# Patient Record
Sex: Female | Born: 1935 | Race: White | Hispanic: No | Marital: Married
Health system: Midwestern US, Community
[De-identification: ages and names within clinical notes are randomized; demographics above are authoritative.]

## PROBLEM LIST (undated history)

## (undated) DIAGNOSIS — I83009 Varicose veins of unspecified lower extremity with ulcer of unspecified site: Secondary | ICD-10-CM

## (undated) DIAGNOSIS — J13 Pneumonia due to Streptococcus pneumoniae: Secondary | ICD-10-CM

## (undated) DIAGNOSIS — I2510 Atherosclerotic heart disease of native coronary artery without angina pectoris: Secondary | ICD-10-CM

## (undated) DIAGNOSIS — I872 Venous insufficiency (chronic) (peripheral): Secondary | ICD-10-CM

## (undated) DIAGNOSIS — M51379 Other intervertebral disc degeneration, lumbosacral region without mention of lumbar back pain or lower extremity pain: Secondary | ICD-10-CM

## (undated) DIAGNOSIS — E785 Hyperlipidemia, unspecified: Secondary | ICD-10-CM

## (undated) DIAGNOSIS — C50212 Malignant neoplasm of upper-inner quadrant of left female breast: Secondary | ICD-10-CM

## (undated) DIAGNOSIS — M810 Age-related osteoporosis without current pathological fracture: Secondary | ICD-10-CM

## (undated) DIAGNOSIS — M5137 Other intervertebral disc degeneration, lumbosacral region: Secondary | ICD-10-CM

## (undated) DIAGNOSIS — R9089 Other abnormal findings on diagnostic imaging of central nervous system: Secondary | ICD-10-CM

## (undated) DIAGNOSIS — E039 Hypothyroidism, unspecified: Secondary | ICD-10-CM

## (undated) DIAGNOSIS — M4120 Other idiopathic scoliosis, site unspecified: Secondary | ICD-10-CM

## (undated) DIAGNOSIS — Z85850 Personal history of malignant neoplasm of thyroid: Secondary | ICD-10-CM

## (undated) DIAGNOSIS — L97909 Non-pressure chronic ulcer of unspecified part of unspecified lower leg with unspecified severity: Secondary | ICD-10-CM

## (undated) HISTORY — DX: Other abnormal findings on diagnostic imaging of central nervous system: R90.89

## (undated) HISTORY — DX: Other intervertebral disc degeneration, lumbosacral region without mention of lumbar back pain or lower extremity pain: M51.379

## (undated) HISTORY — DX: Other intervertebral disc degeneration, lumbosacral region: M51.37

## (undated) HISTORY — DX: Age-related osteoporosis without current pathological fracture: M81.0

## (undated) HISTORY — DX: Non-pressure chronic ulcer of unspecified part of unspecified lower leg with unspecified severity: I83.009

## (undated) HISTORY — DX: Personal history of malignant neoplasm of thyroid: Z85.850

## (undated) HISTORY — PX: THYROIDECTOMY: SHX17

## (undated) HISTORY — DX: Malignant neoplasm of upper-inner quadrant of left female breast: C50.212

## (undated) HISTORY — DX: Pneumonia due to Streptococcus pneumoniae: J13

## (undated) HISTORY — DX: Varicose veins of unspecified lower extremity with ulcer of unspecified site: L97.909

## (undated) HISTORY — PX: OTHER SURGICAL HISTORY: SHX169

## (undated) HISTORY — DX: Atherosclerotic heart disease of native coronary artery without angina pectoris: I25.10

## (undated) HISTORY — DX: Hypothyroidism, unspecified: E03.9

## (undated) HISTORY — DX: Other idiopathic scoliosis, site unspecified: M41.20

## (undated) HISTORY — DX: Venous insufficiency (chronic) (peripheral): I87.2

---

## 2009-02-23 NOTE — Procedures (Signed)
Procedures filed by Adah Salvage, MD at 02/26/09 [phone]                Author: Adah Salvage, MD  Service: --  Author Type: Physician       Filed: 02/26/09 0906  Date of Service: 02/23/09 0804  Status: Addendum          Editor: Adah Salvage, MD (Physician)            Procedure Orders        1. US DUPLEX LOWER EXT VEIN Sherre Poot [[id number]] ordered by  at 02/23/09 0804                         <!--EPICS-->                   [facility]<BR>                         [id number]<BR>                             [place], [place] [address]<BR> <BR> <BR> Name:      Gina Serrano          Service Date:    02/22/2009<BR> DOB:       February 02, 1932                Ordered by:<BR> MR #       [id number]                 Location:<BR> Sex:       F                          Age:             75<BR> Billing #: [id number]              Date of Adm:     02/22/2009<BR> <BR> <BR>                        LOWER EXTREMITY VENOUS DUPLEX<BR> <BR> <BR> REASON FOR PROCEDURE:  Leg pain.<BR> <BR> FINDINGS:  Pulsed Doppler spectral  analysis shows fully competent deep and<BR> superficial systems without significant reflux. B-mode and color imaging<BR> shows fully compressible vessels throughout without thrombosis or<BR> obstruction.<BR> <BR> SUMMARY:  Negative lower extremity venous  duplex for thrombosis or<BR> significant reflux.<BR> <BR> E-Signed By<BR> Alvina Filbert, MD 02/26/2009 09:05<BR> Alvina Filbert, MD<BR> <BR> cc:   Sunnie Nielsen. Angeli, DO<BR>       Alvina Filbert, MD<BR> <BR> <BR> <BR> MW/WMX; D: 02/23/2009  7:43 A; T: 02/23/2009   8:04 A; DOC# [id number]; JOB#<BR> [id number]<OZ> <!--EPICE-->

## 2009-02-23 NOTE — Procedures (Addendum)
[facility]   [address]   [place], [place] [address]      Name: Gina Serrano, Gina Serrano Service Date: 02/22/2009  DOB: 1931/09/09 Ordered by:  MR # [id number] Location:  Sex: F Age: 75  Billing #: [id number] Date of Adm: 02/22/2009       LOWER EXTREMITY VENOUS DUPLEX      REASON FOR PROCEDURE: Leg pain.    FINDINGS: Pulsed Doppler spectral analysis shows fully competent deep and  superficial systems without significant reflux. B-mode and color imaging  shows fully compressible vessels throughout without thrombosis or  obstruction.    SUMMARY: Negative lower extremity venous duplex for thrombosis or  significant reflux.    E-Signed By  Alvina Filbert, MD 02/26/2009 09:05  Alvina Filbert, MD    cc: Sunnie Nielsen. Mort Sawyers, DO   Alvina Filbert, MD        MW/WMX; D: 02/23/2009 7:43 A; T: 02/23/2009 8:04 A; DOC# [id number]; JOB#  [id number]

## 2009-09-20 NOTE — Progress Notes (Signed)
Ms. Bresnan came for follow-up.      She is having no symptoms of shortness of breath.      Her ROS is unchanged.     Heart rhythm normal.  Lungs were clear.     Abdomen soft.      Neuro grossly intact.      I reassured her that her heart was fine.  Continue medications.  We will see her back for the stress test in July.     She cannot tolerate statins or beta blockers.     MedDATA/jrc      BP 116/68   Pulse 65   Resp 18   Ht 5\' 7"  (1.702 m)   Wt 132 lb (59.875 kg)   SpO2 97%  \\  Current outpatient prescriptions ordered prior to encounter   Medication Sig Dispense Refill   ??? aspirin delayed-release 81 mg tablet Take 81 mg by mouth daily.       ??? clopidogrel (PLAVIX) 75 mg tablet Take  by mouth daily.

## 2009-09-22 NOTE — Communication Body (Addendum)
Gina Serrano came for follow-up.    Current outpatient prescriptions:SYNTHROID 150 mcg tablet, , Disp: , Rfl: ;  aspirin delayed-release 81 mg tablet, Take 81 mg by mouth daily., Disp: , Rfl: ;  BACTROBAN NASAL 2 % nasal ointment, , Disp: , Rfl: ;  clopidogrel (PLAVIX) 75 mg tablet, Take  by mouth daily., Disp: , Rfl:     BP 116/68   Pulse 65   Resp 18   Ht 5\' 7"  (1.702 m)   Wt 132 lb (59.875 kg)   SpO2 97%    She is having no symptoms of shortness of breath.      Her ROS is unchanged.     Heart rhythm normal.  Lungs were clear.     Abdomen soft.      Neuro grossly intact.      I reassured her that her heart was fine.  Continue medications.  We will see her back for the stress test in July.     She cannot tolerate statins or beta blockers.

## 2010-03-21 NOTE — Telephone Encounter (Signed)
Patient came in for stress echo last week, she states this weekend she went to her physician because she started having an allergic reaction on her skin, at first she felt it may be from the Lipitor but then they formed like the EKG leads.  Her physician gave her a shot of cortizon and some oitment he felt it was more from the leads then the Lipitor but she would like to check with [place] and make sure as well as we need to list this reaction and let her know what to do for the future for testing.

## 2010-03-22 NOTE — Telephone Encounter (Signed)
Lm

## 2010-04-08 NOTE — Telephone Encounter (Addendum)
Spoke with patient who reports and allergic reaction that appeared to be in the shape of the leads, however, she had restarted the Lipitor around the same time, so she was unsure what was causing her symptoms.  She also reports some generalized aching along with the rash on the skin.  She was advised to discontinue the lipitor and then resume for a short time when the skin rash is completely gone and see if symptoms return.  Patient can take benedryl for symptoms immediately if they DO return, and notify us immediately.  Patient verbalized understanding of teaching and is agreeable with the plan of care.

## 2010-08-15 ENCOUNTER — Ambulatory Visit
Admission: RE | Admit: 2010-08-15 | Discharge: 2010-08-15 | Payer: Self-pay | Source: Home / Self Care | Admitting: Emergency Medicine

## 2010-08-15 DIAGNOSIS — E782 Mixed hyperlipidemia: Secondary | ICD-10-CM | POA: Insufficient documentation

## 2010-08-17 ENCOUNTER — Telehealth (INDEPENDENT_AMBULATORY_CARE_PROVIDER_SITE_OTHER): Payer: Self-pay | Admitting: *Deleted

## 2010-09-20 ENCOUNTER — Encounter: Payer: Self-pay | Admitting: Family Medicine

## 2010-09-22 NOTE — Assessment & Plan Note (Signed)
Summary: NASAL CONGESTION/TJ Room 4   Vital Signs:  Patient Profile:   77 Years Old Female CC:      Cough, congestion, exhausted, chills x 8 days Height:     67 inches Weight:      135 pounds O2 Sat:      95 % O2 treatment:    Room Air Temp:     99.6 degrees F oral Pulse rate:   75 / minute Pulse rhythm:   regular Resp:     20 per minute BP sitting:   105 / 56  (left arm) Cuff size:   regular  Vitals Entered By: Emilio Math (August 15, 2010 2:46 PM)                  Current Allergies: ! CIPRO ! DOXYCYCLINE ! AMOXICILLIN ! [facility] ! LEVAQUINHistory of Present Illness History from: patient Chief Complaint: Cough, congestion, exhausted, chills x 8 days History of Present Illness: 77 Years Old Female complains of onset of cold symptoms for 9 days.  Tola has been using no OTC meds.  Her husband has pneumonia and is currently hospitalized.  She has many antibiotic allergies. No sore throat + cough No pleuritic pain No wheezing + nasal congestion + post-nasal drainage + sinus pain/pressure + chest congestion No itchy/red eyes No earache No hemoptysis No SOB No chills/sweats No fever + nausea No vomiting No abdominal pain No diarrhea No skin rashes + fatigue No myalgias No headache   Current Meds SYNTHROID 50 MCG TABS (LEVOTHYROXINE SODIUM)  ASPIRIN 81 MG CHEW (ASPIRIN)  ERYTHROMYCIN BASE 500 MG TABS (ERYTHROMYCIN BASE) 1 by mouth two times a day for 10 days  REVIEW OF SYSTEMS Constitutional Symptoms      Denies fever, chills, night sweats, weight loss, weight gain, and fatigue.  Eyes       Denies change in vision, eye pain, eye discharge, glasses, contact lenses, and eye surgery. Ear/Nose/Throat/Mouth       Complains of frequent runny nose, sinus problems, sore throat, and hoarseness.      Denies hearing loss/aids, change in hearing, ear pain, ear discharge, dizziness, frequent nose bleeds, and tooth pain or bleeding.  Respiratory       Complains  of dry cough.      Denies productive cough, wheezing, shortness of breath, asthma, bronchitis, and emphysema/COPD.  Cardiovascular       Denies murmurs, chest pain, and tires easily with exhertion.    Gastrointestinal       Complains of nausea/vomiting.      Denies stomach pain, diarrhea, constipation, blood in bowel movements, and indigestion. Genitourniary       Denies painful urination, kidney stones, and loss of urinary control. Neurological       Denies paralysis, seizures, and fainting/blackouts. Musculoskeletal       Denies muscle pain, joint pain, joint stiffness, decreased range of motion, redness, swelling, muscle weakness, and gout.  Skin       Denies bruising, unusual mles/lumps or sores, and hair/skin or nail changes.  Psych       Denies mood changes, temper/anger issues, anxiety/stress, speech problems, depression, and sleep problems.  Past History:  Past Medical History: Hyperlipidemia  Past Surgical History: Head 05 Eye 08 Thyroid 05  Social History: Non smoker No ETOH No DRugs Physical Exam General appearance: well developed, well nourished, no acute distress Ears: normal, no lesions or deformities Nasal: mucosa pink, nonedematous, no septal deviation, turbinates normal Oral/Pharynx: tongue normal, posterior pharynx without  erythema or exudate Chest/Lungs: no rales, wheezes, or rhonchi bilateral, breath sounds equal without effort Heart: regular rate and  rhythm, no murmur Skin: no obvious rashes or lesions MSE: oriented to time, place, and person Assessment New Problems: PNEUMONIA, COMMUNITY ACQUIRED, PNEUMOCOCCAL (ICD-481) COUGH (ICD-786.2) UPPER RESPIRATORY INFECTION, ACUTE (ICD-465.9) COUGH (ICD-786.2) HYPERLIPIDEMIA (ICD-272.4)  CXR: Right lower lobe airspace disease.  Patient Education: Patient and/or caregiver instructed in the following: rest, fluids, Tylenol prn.  Plan New Medications/Changes: ERYTHROMYCIN BASE 500 MG TABS (ERYTHROMYCIN  BASE) 1 by mouth two times a day for 10 days  #20 x 0, 08/15/2010, Hoyt Koch MD  New Orders: New Patient Level III [id number] T-Chest x-ray, 2 views [71020] Pulse Oximetry [94760] Planning Comments:   1)  Take the prescribed antibiotic as instructed.  Patient is allergic to many antibiotics but thinks this one is ok.  Will treat with the pneumonia dose of 4x per day for 10 days.  Needs to follow up with PCP in 2-3 days to ensure she is improving.  If getting worse (fevers, worsening symptoms) go to ER.  Use nasal saline solution (over the counter) at least 3 times a day.  Can take tylenol every 6 hours or motrin every 8 hours for pain or fever.   The patient and/or caregiver has been counseled thoroughly with regard to medications prescribed including dosage, schedule, interactions, rationale for use, and possible side effects and they verbalize understanding.  Diagnoses and expected course of recovery discussed and will return if not improved as expected or if the condition worsens. Patient and/or caregiver verbalized understanding.  Prescriptions: ERYTHROMYCIN BASE 500 MG TABS (ERYTHROMYCIN BASE) 1 by mouth two times a day for 10 days  #20 x 0   Entered and Authorized by:   Hoyt Koch MD   Signed by:   Hoyt Koch MD on 08/15/2010   Method used:   Print then Give to Patient   RxID:   [id number]   Orders Added: 1)  New Patient Level III [95621] 2)  T-Chest x-ray, 2 views [71020] 3)  Pulse Oximetry [30865]

## 2010-09-22 NOTE — Progress Notes (Signed)
  Phone Note Outgoing Call Call back at [facility] Phone [phone]   Call placed by: Emilio Math,  August 17, 2010 10:17 AM Call placed to: Patient

## 2010-09-22 NOTE — Progress Notes (Signed)
  Phone Note Outgoing Call Call back at [facility] Phone [phone]   Call placed by: Emilio Math,  August 17, 2010 10:20 AM Call placed to: Patient Summary of Call: Spoke to patient's husband she was asleep, he says she's not feeling bettter, I advised him to take her to her doctor or the ED, since she does have pneumonia and is not getting better.

## 2010-09-28 NOTE — Letter (Signed)
Summary: Internal Other  Internal Other   Imported By: Dannette Barbara 09/20/2010 08:19:19  _____________________________________________________________________  External Attachment:    Type:   Image     Comment:   External Document

## 2010-10-19 ENCOUNTER — Encounter: Payer: Self-pay | Admitting: Emergency Medicine

## 2010-10-19 ENCOUNTER — Ambulatory Visit: Payer: Medicare Other | Admitting: Emergency Medicine

## 2010-10-23 ENCOUNTER — Telehealth (INDEPENDENT_AMBULATORY_CARE_PROVIDER_SITE_OTHER): Payer: Self-pay

## 2010-10-27 NOTE — Assessment & Plan Note (Signed)
Summary: Possible sinus infection/left foot pain   Vital Signs:  Patient Profile:   77 Years Old Female CC:      ?sinus infection x 2 weeks, left foot swelling and itching x 2 days Height:     67 inches Weight:      138 pounds O2 Sat:      97 % O2 treatment:    Room Air Temp:     98.3 degrees F oral Pulse rate:   72 / minute Resp:     16 per minute BP sitting:   112 / 68  (left arm) Cuff size:   regular  Vitals Entered By: Lajean Saver RN (October 19, 2010 3:37 PM)                  Updated Prior Medication List: SYNTHROID 50 MCG TABS (LEVOTHYROXINE SODIUM)  ASPIRIN 81 MG CHEW (ASPIRIN)   Current Allergies (reviewed today): ! CIPRO ! DOXYCYCLINE ! AMOXICILLIN ! [facility] ! LEVAQUINHistory of Present Illness History from: patient Chief Complaint: ?sinus infection x 2 weeks, left foot swelling and itching x 2 days History of Present Illness: 77 Years Old Female complains of onset of cold symptoms for 2 1/2 weeks.  Mileah has been using Mucinex which is helping a little bit.  She had pneumonia a few months ago.  She also complains of L foot itching, mild swelling, and mild soreness for 2 days.  She thinks it is getting better today.  No leg pain, CP, SOB. No sore throat + cough No pleuritic pain No wheezing + nasal congestion No post-nasal drainage + sinus pain/pressure No chest congestion No itchy/red eyes No earache No hemoptysis No SOB No chills/sweats No fever No nausea No vomiting No abdominal pain No diarrhea No skin rashes No fatigue No myalgias No headache   REVIEW OF SYSTEMS Constitutional Symptoms      Denies fever, chills, night sweats, weight loss, weight gain, and fatigue.  Eyes       Denies change in vision, eye pain, eye discharge, glasses, contact lenses, and eye surgery. Ear/Nose/Throat/Mouth       Complains of frequent runny nose and sinus problems.      Denies hearing loss/aids, change in hearing, ear pain, ear discharge, dizziness,  frequent nose bleeds, sore throat, hoarseness, and tooth pain or bleeding.  Respiratory       Denies dry cough, productive cough, wheezing, shortness of breath, asthma, bronchitis, and emphysema/COPD.  Cardiovascular       Denies murmurs, chest pain, and tires easily with exhertion.    Gastrointestinal       Denies stomach pain, nausea/vomiting, diarrhea, constipation, blood in bowel movements, and indigestion. Genitourniary       Denies painful urination, kidney stones, and loss of urinary control. Neurological       Denies paralysis, seizures, and fainting/blackouts. Musculoskeletal       Complains of redness and swelling.      Denies muscle pain, joint pain, joint stiffness, decreased range of motion, muscle weakness, and gout.  Skin       Denies bruising, unusual mles/lumps or sores, and hair/skin or nail changes.  Psych       Denies mood changes, temper/anger issues, anxiety/stress, speech problems, depression, and sleep problems.  Past History:  Past Medical History: Reviewed history from 08/15/2010 and no changes required. Hyperlipidemia  Past Surgical History: Reviewed history from 08/15/2010 and no changes required. Head 05 Eye 08 Thyroid 05  Social History: Reviewed history from 08/15/2010  and no changes required. Non smoker No ETOH No DRugs Physical Exam General appearance: well developed, well nourished, no acute distress Ears: normal, no lesions or deformities Nasal: mucosa pink, nonedematous, no septal deviation, turbinates normal Oral/Pharynx: tongue normal, posterior pharynx without erythema or exudate Chest/Lungs: no rales, wheezes, or rhonchi bilateral, breath sounds equal without effort Heart: regular rate and  rhythm, no murmur Skin: L foot with mild erythema and swelling medial aspect, FROM, distal NV status intact, scars from ulcers but no active ulcers MSE: oriented to time, place, and person Assessment New Problems: SINUSITIS, ACUTE  (ICD-461.9)   Patient Education: Patient and/or caregiver instructed in the following: rest, fluids.  Plan New Medications/Changes: ERYTHROMYCIN BASE 500 MG TABS (ERYTHROMYCIN BASE) 1 by mouth two times a day for 7 days  #14 x 0, 10/19/2010, Hoyt Koch MD  Planning Comments:   For her foot, I suggest Claritin daily + compression stocking + ice a few times a day.  If not improving, follow up with her PCP.  DDx includes insect bite, tendonitis with local swelling.  1)  Take the prescribed antibiotic as instructed.  LIkely is bacterial sinusitis from her symptoms.  If not improving, refer to ENT. 2)  Use nasal saline solution (over the counter) at least 3 times a day. 3)  Use over the counter Zyrtec  as needed to help with congestion. 4)  Can take tylenol every 6 hours or motrin every 8 hours for pain or fever. 5)  Follow up with your primary doctor  if no improvement in 5-7 days, sooner if increasing pain, fever, or new symptoms.    The patient and/or caregiver has been counseled thoroughly with regard to medications prescribed including dosage, schedule, interactions, rationale for use, and possible side effects and they verbalize understanding.  Diagnoses and expected course of recovery discussed and will return if not improved as expected or if the condition worsens. Patient and/or caregiver verbalized understanding.  Prescriptions: ERYTHROMYCIN BASE 500 MG TABS (ERYTHROMYCIN BASE) 1 by mouth two times a day for 7 days  #14 x 0   Entered and Authorized by:   Hoyt Koch MD   Signed by:   Hoyt Koch MD on 10/19/2010   Method used:   Print then Give to Patient   RxID:   [id number]

## 2010-11-23 DIAGNOSIS — F419 Anxiety disorder, unspecified: Secondary | ICD-10-CM | POA: Insufficient documentation

## 2010-11-23 DIAGNOSIS — E7800 Pure hypercholesterolemia, unspecified: Secondary | ICD-10-CM | POA: Insufficient documentation

## 2010-11-23 NOTE — Progress Notes (Signed)
[address] [place], [place] [address]   Phone [phone], Fax [phone]   &  [address], [place], [place] [address]   Phone [phone], Fax [phone]        Dear Nicanor Bake, MD:    Our mutual patient came to see me for a follow up. Finding are as below.     DATE: 11/23/2010   PATIENT NAME: Gina Serrano  77 y.o. Caucasian female  05/03/1932           HISTORY: Was doing well. Has had CP in the form of tightness with no relief with NTG. Happens on exertion and at rest- infrequent      PMSH:   History   Substance Use Topics   ??? Smoking status: Never Smoker    ??? Smokeless tobacco: Never Used   ??? Alcohol Use: No     Past medical history:I have reviewed and confirmed the past medical history in the chart.  Allergies: reviewed allergy section in the chart    ROS: Pertinent items noted in HPI, all other systems negative.    MEDICATIONS:   Current Outpatient Prescriptions   Medication Sig Dispense Refill   ??? SYNTHROID 150 mcg tablet        ??? aspirin delayed-release 81 mg tablet Take 81 mg by mouth daily.            EXAMINATION:   VITAL SIGNS: BP 118/57   Pulse 60   Resp 18   Ht 5\' 7"  (1.702 m)   Wt 139 lb (63.05 kg)   BMI 21.77 kg/m2   SpO2 98%   General: Currently sitting up in no acute distress. Alert and oriented x 3.  Neck: JVD not elevated.   CVS: Heart sounds normal no gallops.  Lungs: Clear and equal  Abdomen: Soft  CNS: Grossly intact.  Extremeties: There is no pedal edema.   Skin warm and dry     IMPRESSION/PLAN:    1. Coronary atherosclerosis of native coronary artery    2. Pure hypercholesterolemia    3. Anxiety         She needs a stress test but she has to go to [facility] for family issues. Will call us when she is back       With Warm Regards Sincerely,      Shinita Mac Andi Hence MD,DM,FACC,FSCAI

## 2010-12-28 ENCOUNTER — Inpatient Hospital Stay
Admission: RE | Admit: 2010-12-28 | Discharge: 2010-12-28 | Disposition: A | Discharge status: Home / Self Care | Payer: Medicare Other | Source: Ambulatory Visit | Attending: Emergency Medicine | Admitting: Emergency Medicine

## 2010-12-28 ENCOUNTER — Encounter: Payer: Self-pay | Admitting: Emergency Medicine

## 2011-02-15 NOTE — Patient Instructions (Addendum)
Have a wonderful summer! :)

## 2011-02-20 NOTE — Telephone Encounter (Signed)
Lm message for patient to call, Dr. Georga Bora states that he can only recommend benadryl which she states does no good, otherwise to discuss with pcp or allergist will put a warning on front of her chart

## 2011-02-20 NOTE — Telephone Encounter (Signed)
Patient called upset that she is again broken out from the leads and possibly the "brillo" used to clean her off says it should have been addressed since it happened the last time.  Wants to know and has called twice to see how this can be prevented next year from happening

## 2011-03-28 NOTE — Telephone Encounter (Signed)
Patient returned our call to schedule follow up appointment as you had requested to review her blood work; although patient is out of town until sometime in October.  What would you like to do?

## 2011-07-24 NOTE — Telephone Encounter (Signed)
  Phone Note Outgoing Call   Call placed by: Linton Flemings RN,  October 23, 2010 1:14 PM Call placed to: Patient Summary of Call: patient states her sinus is better but her foot isn't any better. States she hasn't been wearing the compression stocking daily but it does seems to help. Instructed her to wear them daily and to sue the ice. She states she did not use the ice b/c she don't want to be cold.  Instructed her to see PCP if not getting any better

## 2011-07-24 NOTE — Progress Notes (Signed)
Summary: SORT THROAT/FEVER   Vital Signs:  Patient Profile:   77 Years Old Female CC:      sore throat, prod. cough x 1 week Height:     67 inches Weight:      139 pounds O2 Sat:      99 % O2 treatment:    Room Air Temp:     98.7 degrees F oral Pulse rate:   68 / minute Resp:     14 per minute BP sitting:   121 / 66  (left arm) Cuff size:   regular  Vitals Entered By: Lajean Saver RN (Dec 28, 2010 9:39 AM)                  Updated Prior Medication List: SYNTHROID 50 MCG TABS (LEVOTHYROXINE SODIUM)  ASPIRIN 81 MG CHEW (ASPIRIN)   Current Allergies (reviewed today): ! CIPRO ! DOXYCYCLINE ! AMOXICILLIN ! [facility] ! LEVAQUINHistory of Present Illness History from: patient Chief Complaint: sore throat, prod. cough x 1 week History of Present Illness: 77 Years Old Female complains of onset of cold symptoms for 7 days.  Hanh has been using OTC antihistamine, Delsym, and Ibu which is helping a little bit.  She has been around sick kids recently. +sore throat (main symptom) No cough No pleuritic pain No wheezing No nasal congestion No post-nasal drainage No sinus pain/pressure No chest congestion No itchy/red eyes No earache No hemoptysis No SOB No chills/sweats No fever No nausea No vomiting No abdominal pain No diarrhea No skin rashes No fatigue No myalgias No headache   REVIEW OF SYSTEMS Constitutional Symptoms      Denies fever, chills, night sweats, weight loss, weight gain, and fatigue.  Eyes       Denies change in vision, eye pain, eye discharge, glasses, contact lenses, and eye surgery. Ear/Nose/Throat/Mouth       Complains of frequent runny nose and sore throat.      Denies hearing loss/aids, change in hearing, ear pain, ear discharge, dizziness, frequent nose bleeds, sinus problems, hoarseness, and tooth pain or bleeding.  Respiratory       Complains of productive cough.      Denies dry cough, wheezing, shortness of breath, asthma, bronchitis,  and emphysema/COPD.  Cardiovascular       Denies murmurs, chest pain, and tires easily with exhertion.    Gastrointestinal       Denies stomach pain, nausea/vomiting, diarrhea, constipation, blood in bowel movements, and indigestion. Genitourniary       Denies painful urination, kidney stones, and loss of urinary control. Neurological       Denies paralysis, seizures, and fainting/blackouts. Musculoskeletal       Denies muscle pain, joint pain, joint stiffness, decreased range of motion, redness, swelling, muscle weakness, and gout.  Skin       Denies bruising, unusual mles/lumps or sores, and hair/skin or nail changes.  Psych       Denies mood changes, temper/anger issues, anxiety/stress, speech problems, depression, and sleep problems.  Past History:  Past Medical History: Reviewed history from 08/15/2010 and no changes required. Hyperlipidemia  Past Surgical History: Reviewed history from 08/15/2010 and no changes required. Head 05 Eye 08 Thyroid 05  Social History: Reviewed history from 08/15/2010 and no changes required. Non smoker No ETOH No DRugs Physical Exam General appearance: well developed, well nourished, no acute distress Ears: normal, no lesions or deformities Nasal: mucosa pink, nonedematous, no septal deviation, turbinates normal Oral/Pharynx: tongue normal, posterior pharynx  without erythema or exudate Chest/Lungs: no rales, wheezes, or rhonchi bilateral, breath sounds equal without effort Heart: regular rate and  rhythm, no murmur MSE: oriented to time, place, and person Assessment New Problems: UPPER RESPIRATORY INFECTION, ACUTE (ICD-465.9) SORE THROAT (ICD-462)   Plan New Medications/Changes: PREDNISONE (PAK) 10 MG TABS (PREDNISONE) 6 day pack, use as directed  #1 x 0, 12/28/2010, Hoyt Koch MD ERYTHROMYCIN BASE 500 MG TABS (ERYTHROMYCIN BASE) 1 by mouth two times a day for 10 days  #20 x 0, 12/28/2010, Hoyt Koch MD  Planning  Comments:   1)  Take the prescribed antibiotic as instructed.  Strep test is mildly positive. 2)  Use nasal saline solution (over the counter) at least 3 times a day. 3)  Use over the counter decongestants like Zyrtec-D every 12 hours as needed to help with congestion. 4)  Can take tylenol every 6 hours or motrin every 8 hours for pain or fever. 5)  Follow up with your primary doctor  if no improvement in 5-7 days, sooner if increasing pain, fever, or new symptoms.    The patient and/or caregiver has been counseled thoroughly with regard to medications prescribed including dosage, schedule, interactions, rationale for use, and possible side effects and they verbalize understanding.  Diagnoses and expected course of recovery discussed and will return if not improved as expected or if the condition worsens. Patient and/or caregiver verbalized understanding.  Prescriptions: PREDNISONE (PAK) 10 MG TABS (PREDNISONE) 6 day pack, use as directed  #1 x 0   Entered and Authorized by:   Hoyt Koch MD   Signed by:   Hoyt Koch MD on 12/28/2010   Method used:   Print then Give to Patient   RxID:   [id number] ERYTHROMYCIN BASE 500 MG TABS (ERYTHROMYCIN BASE) 1 by mouth two times a day for 10 days  #20 x 0   Entered and Authorized by:   Hoyt Koch MD   Signed by:   Hoyt Koch MD on 12/28/2010   Method used:   Print then Give to Patient   RxID:   [id number]     Laboratory Results  Date/Time Received: Dec 28, 2010 9:40 AM  Date/Time Reported: Dec 28, 2010 9:40 AM   Other Tests  Rapid Strep: positive  Kit Test Internal QC: Negative   (Normal Range: Negative)

## 2013-07-30 ENCOUNTER — Emergency Department (INDEPENDENT_AMBULATORY_CARE_PROVIDER_SITE_OTHER)
Admission: EM | Admit: 2013-07-30 | Discharge: 2013-07-30 | Disposition: A | Discharge status: Home / Self Care | Payer: Medicare Other | Source: Home / Self Care | Attending: Family Medicine | Admitting: Family Medicine

## 2013-07-30 ENCOUNTER — Encounter: Payer: Self-pay | Admitting: Emergency Medicine

## 2013-07-30 DIAGNOSIS — I8312 Varicose veins of left lower extremity with inflammation: Secondary | ICD-10-CM

## 2013-07-30 DIAGNOSIS — I8310 Varicose veins of unspecified lower extremity with inflammation: Secondary | ICD-10-CM

## 2013-07-30 DIAGNOSIS — I8311 Varicose veins of right lower extremity with inflammation: Secondary | ICD-10-CM

## 2013-07-30 HISTORY — DX: Hyperlipidemia, unspecified: E78.5

## 2013-07-30 MED ORDER — PENTOXIFYLLINE ER 400 MG PO TBCR: EXTENDED_RELEASE_TABLET | ORAL | Status: DC

## 2013-07-30 NOTE — ED Provider Notes (Signed)
CSN: [id number]     Arrival date & time 07/30/13  1438 History   First MD Initiated Contact with Patient 07/30/13 1511     Chief Complaint  Patient presents with  . Skin Ulcer      HPI Comments: Patient has a past history of venous insuffiency and stasis dermatitis, but no recent leg ulcers.  Over the past two weeks she has felt a stinging sensation on her right medial ankle at the site of a past ulcer.  She visited her PCP in [place] who performed a plain X-ray that was negative.  She states that she has not been wearing her support hose, but resumed wearing them when she felt the discomfort.  Patient is a 79 y.o. female presenting with leg pain. The history is provided by the patient and a relative.  Leg Pain Location:  Ankle Time since incident:  2 weeks Injury: no   Ankle location:  R ankle Pain details:    Quality:  Burning   Radiates to:  Does not radiate   Severity:  Mild   Onset quality:  Sudden   Duration:  2 weeks   Timing:  Constant   Progression:  Unchanged Chronicity:  New Prior injury to area:  No Relieved by:  Nothing Worsened by:  Nothing tried Ineffective treatments:  Compression Associated symptoms: no decreased ROM, no fever, no itching, no numbness and no swelling     Past Medical History  Diagnosis Date  . Hyperlipidemia    Past Surgical History  Procedure Laterality Date  . Cranotomy    . Thyroidectomy     No family history on file. History  Substance Use Topics  . Smoking status: Never Smoker   . Smokeless tobacco: Not on file  . Alcohol Use: Yes   OB History   Grav Para Term Preterm Abortions TAB SAB Ect Mult Living                 Review of Systems  Constitutional: Negative.  Negative for fever.  HENT: Negative.   Eyes: Negative.   Respiratory: Negative.   Cardiovascular: Negative for leg swelling.  Gastrointestinal: Negative.   Genitourinary: Negative.   Musculoskeletal: Negative.   Skin: Negative for color change, itching, rash and  wound.  Neurological: Negative for headaches.    Allergies  Amoxicillin; Ciprofloxacin; Colesevelam; Doxycycline; and Levofloxacin  Home Medications   Current Outpatient Rx  Name  Route  Sig  Dispense  Refill  . aspirin 81 MG tablet   Oral   Take 81 mg by mouth daily.         Marland Kitchen levothyroxine (SYNTHROID, LEVOTHROID) 100 MCG tablet   Oral   Take 100 mcg by mouth daily before breakfast.         . metoprolol succinate (TOPROL-XL) 100 MG 24 hr tablet   Oral   Take 100 mg by mouth daily. Take with or immediately following a meal.         . pentoxifylline (TRENTAL) 400 MG CR tablet      Take one tab by mouth once daily with food   15 tablet   0    BP 137/65  Pulse 73  Temp(Src) 98.1 F (36.7 C) (Oral)  Ht 5\' 7"  (1.702 m)  Wt 147 lb (66.679 kg)  BMI 23.02 kg/m2  SpO2 98% Physical Exam  Nursing note and vitals reviewed. Constitutional: She is oriented to person, place, and time. She appears well-developed and well-nourished. No distress.  HENT:  Head: Normocephalic.  Mouth/Throat: Oropharynx is clear and moist.  Eyes: Conjunctivae are normal. Pupils are equal, round, and reactive to light.  Cardiovascular: Normal rate and normal heart sounds.   Pulmonary/Chest: Breath sounds normal.  Abdominal: There is no tenderness.  Musculoskeletal: She exhibits no edema and no tenderness.       Feet:  Over the medial malleolus of right ankle is a 3cm dia patch of nontender erythema.  No swelling or warmth.  Tiny 2mm eschar present; no drainage. Pedal pulses are present.  Lower legs have hyperpigmentation and scaling.  No edema.  No calf tenderness  Lymphadenopathy:    She has no cervical adenopathy.  Neurological: She is alert and oriented to person, place, and time.  Skin: Skin is warm and dry.    ED Course  Procedures        MDM   1. Venous stasis dermatitis, right   2. Venous stasis dermatitis, left    Area right medial ankle gently cleaned with HibiClens.   Applied Xeroform gauze and Tegaderm dressing.   Begin trial of Trental 400mg  once daily with food. Elevate leg whenever possible.  Wear support hose daytime.  Change dressing daily until healed. Followup with PCP in one week, earlier if symptoms worsen    Lattie Haw, MD 08/01/13 [phone]

## 2013-07-30 NOTE — ED Notes (Signed)
Skin ulcer x 2 weeks inside right ankle, hx 6 yrs ago

## 2013-08-06 ENCOUNTER — Ambulatory Visit: Payer: Medicare Other | Admitting: Family Medicine

## 2013-08-06 ENCOUNTER — Encounter: Payer: Self-pay | Admitting: Family Medicine

## 2013-08-06 ENCOUNTER — Ambulatory Visit (INDEPENDENT_AMBULATORY_CARE_PROVIDER_SITE_OTHER): Payer: Medicare Other | Admitting: Family Medicine

## 2013-08-06 VITALS — BP 123/62 | HR 67 | Wt 150.0 lb

## 2013-08-06 DIAGNOSIS — I83029 Varicose veins of left lower extremity with ulcer of unspecified site: Secondary | ICD-10-CM | POA: Insufficient documentation

## 2013-08-06 DIAGNOSIS — L97929 Non-pressure chronic ulcer of unspecified part of left lower leg with unspecified severity: Secondary | ICD-10-CM | POA: Insufficient documentation

## 2013-08-06 DIAGNOSIS — F4321 Adjustment disorder with depressed mood: Secondary | ICD-10-CM | POA: Insufficient documentation

## 2013-08-06 DIAGNOSIS — IMO0001 Reserved for inherently not codable concepts without codable children: Secondary | ICD-10-CM

## 2013-08-06 DIAGNOSIS — I83009 Varicose veins of unspecified lower extremity with ulcer of unspecified site: Secondary | ICD-10-CM

## 2013-08-06 NOTE — Patient Instructions (Signed)
Apply xeroform gauze (yellow, cut to the size of a quarter) to wound, new application daily after showering or bathing.  Cover with adhesive bandage and wear compression stockings only while awake.  Recheck of the wound at follow up in one week.

## 2013-08-06 NOTE — Progress Notes (Signed)
CC: Kristy Olson is a 79 y.o. female is here for Establish Care   Subjective: HPI:  Complains of wound localized right ankle it has been present for 3 weeks initially described as a bee sting for first 2 weeks noticed worse in the evening when trying to sleep. Over the past week slightly improving she's been using Xeroform and Band-Aids changing every other day she is back to wearing compression stockings. Subjectively she feels that the wound is looking better but she's afraid she may have damaged it this morning while cleaning. She has had many ulcers in the past on the lower extremities one of which requiring a PICC line due to what sounds like osteomyelitis.  Currently she denies fevers, chills, nor any exudate in the lower extremities. Her wound has not been bleeding it is nontender.  She was given a prescription for Pletal but did not fill it yet.  She shares the unfortunate news that her husband passed away 2 weeks ago unexpectedly, was found unresponsive and pronounced dead at their rental property.  She reports a green process has been pus however with the physical and emotional support of her children that she is living with here in town the process is tolerable.   Review Of Systems Outlined In HPI  Past Medical History  Diagnosis Date  . Hyperlipidemia      History reviewed. No pertinent family history.   History  Substance Use Topics  . Smoking status: Never Smoker   . Smokeless tobacco: Not on file  . Alcohol Use: Yes     Objective: Filed Vitals:   08/06/13 1348  BP: 123/62  Pulse: 67    General: Alert and Oriented, No Acute Distress, tearful when talking about her late husband HEENT: Pupils equal, round, reactive to light. Conjunctivae clear.  Moist membranes pharynx unremarkable Lungs: Clear comfortable work of breathing Cardiac: Regular rate and rhythm.  Extremities: No peripheral edema.  Strong peripheral pulses. Overlying the right medial malleolus there is  a 0.5 cm diameter shallow ulceration without any surrounding erythema or tenderness. There is granulation tissue in the middle of this ulceration Mental Status: No  anxiety, nor agitation.  Appears mildly depressed during conversation about her late husband Skin: Warm and dry.  Assessment & Plan: Kristy Olson was seen today for establish care.  Diagnoses and associated orders for this visit:  Venous stasis ulcer, right  Grief    Venous stasis ulcer: I believe this is improving based on description from urgent care last week. I like her to continue with Xeroform however change daily cover with sterile bandage, wear compression stockings during all waking hours. I don't think she needs to fill Pletal since she's getting improvement without using it. Grief: Sure my condolences about her husband's passing it seems she has not had the opportunity talk about her feelings involving the grieving process to others beyond those in her family. We discussed coping strategies including socialization, vocalizing her feelings to others, and considering professional counseling if affecting quality of life.  45 minutes spent face-to-face during visit today of which at least 50% was counseling or coordinating care regarding venous stasis ulcer, grief.   Return in about 1 week (around 08/13/2013).

## 2013-08-12 ENCOUNTER — Ambulatory Visit (INDEPENDENT_AMBULATORY_CARE_PROVIDER_SITE_OTHER): Payer: Medicare Other | Admitting: Family Medicine

## 2013-08-12 ENCOUNTER — Encounter: Payer: Self-pay | Admitting: Family Medicine

## 2013-08-12 VITALS — BP 146/61 | HR 63 | Temp 97.5°F | Wt 146.0 lb

## 2013-08-12 DIAGNOSIS — I83009 Varicose veins of unspecified lower extremity with ulcer of unspecified site: Secondary | ICD-10-CM

## 2013-08-12 DIAGNOSIS — IMO0001 Reserved for inherently not codable concepts without codable children: Secondary | ICD-10-CM

## 2013-08-12 DIAGNOSIS — F4321 Adjustment disorder with depressed mood: Secondary | ICD-10-CM

## 2013-08-12 MED ORDER — LIDOCAINE HCL 2 % EX GEL: CUTANEOUS | Status: DC

## 2013-08-12 NOTE — Patient Instructions (Signed)
Apply xeroform gauze (yellow, cut to the size of a quarter) to wound, new application daily after showering or bathing. Cover with adhesive bandage and wear compression stockings only while awake.   If painful in the evening take off the yellow gauze, apply the numbing medication (Lidocane), and recover with new dry dressing until changing in the morning.  Recheck of the wound at follow up in one week.

## 2013-08-14 NOTE — Progress Notes (Signed)
CC: Kristy Olson is a 79 y.o. female is here for Wound Check   Subjective: HPI:  Followup venous stasis ulcer: Patient has been changing Xeroform and dressing every other day, wearing compression stockings on a daily basis during waking hours. She denies fevers, chills, nor discharge at the site of the ulceration. She's unsure whether or not any better or worse. She has a history of developing osteomyelitis with a similar ulcer years ago. Since I saw her last has been associated with pain described only as pain localized at the site of the ulcer nonradiating worse with touch of else makes better or worse  Followup grief: She believes she's doing worse on a weekly basis handling the death of her husband recently. Symptoms are worsened by family gatherings around the holiday season. She describes subjective depression present on a daily basis mild to moderate in severity.   Review Of Systems Outlined In HPI  Past Medical History  Diagnosis Date  . Hyperlipidemia   . Thyroid cancer      No family history on file.   History  Substance Use Topics  . Smoking status: Never Smoker   . Smokeless tobacco: Not on file  . Alcohol Use: Yes     Objective: Filed Vitals:   08/12/13 1411  BP: 146/61  Pulse: 63  Temp: 97.5 F (36.4 C)    Vital signs reviewed. General: Alert and Oriented, No Acute Distress HEENT: Pupils equal, round, reactive to light. Conjunctivae clear.  External ears unremarkable.  Moist mucous membranes. Lungs: Clear and comfortable work of breathing, speaking in full sentences without accessory muscle use. Cardiac: Regular rate and rhythm.  Neuro: CN II-XII grossly intact, gait normal. Extremities: No peripheral edema.  Strong peripheral pulses. Overlying the right medial malleoli there is a 0.5 cm diameter shallow ulceration with mild surrounding erythema slightly tender to the touch. Central ulceration has mild granulation tissue. Mental Status: No , anxiety, nor  agitation. Logical though process. Mild depression and frequent tearfulness when discussing the passing of her husband Skin: Warm and dry.  Assessment & Plan: Kristy Olson was seen today for wound check.  Diagnoses and associated orders for this visit:  Venous stasis ulcer, right - Ambulatory referral to Wound Clinic  Grief - Ambulatory referral to Psychology  Other Orders - lidocaine (XYLOCAINE JELLY) 2 % jelly; Apply to affected area daily prn    Venous stasis ulcer: Uncontrolled little to no improvement since last checked one week ago referral to wound clinic given she's been dealing with this for 4 weeks now and her history of osteomyelitis in a similar situation. Continue Xeroform stressed to change this daily, cover with sterile gauze continued depression stockings during waking hours. Consider lidocaine jelly when pain is affecting quality of life Grief: Uncontrolled referral to counseling  25 minutes spent face-to-face during visit today of which at least 50% was counseling or coordinating care regarding grief, venous stasis ulcer.   Return in about 2 weeks (around 08/26/2013).

## 2013-08-18 ENCOUNTER — Telehealth: Payer: Self-pay | Admitting: Family Medicine

## 2013-08-18 ENCOUNTER — Encounter: Payer: Self-pay | Admitting: Family Medicine

## 2013-08-18 DIAGNOSIS — I10 Essential (primary) hypertension: Secondary | ICD-10-CM | POA: Insufficient documentation

## 2013-08-18 NOTE — Telephone Encounter (Signed)
Sue Lush, Can you please let mrs. Sheppard know that her outside records report she had been taking synthroid daily and metoprolol 25mg  daily, can she confirm or deny these doses so we can update her med list.   (Records have been reviewed and in Andrea's inbox ready for pickup/mailing.)

## 2013-08-18 NOTE — Telephone Encounter (Signed)
Left message on vm to call me back to confirm if correct dose and mailed records that we received to pt

## 2013-08-25 NOTE — Telephone Encounter (Signed)
Left another message on vm.

## 2013-08-26 ENCOUNTER — Ambulatory Visit (INDEPENDENT_AMBULATORY_CARE_PROVIDER_SITE_OTHER): Payer: Medicare Other | Admitting: Family Medicine

## 2013-08-26 ENCOUNTER — Encounter: Payer: Self-pay | Admitting: Family Medicine

## 2013-08-26 VITALS — BP 122/67 | HR 65 | Wt 148.0 lb

## 2013-08-26 DIAGNOSIS — L97909 Non-pressure chronic ulcer of unspecified part of unspecified lower leg with unspecified severity: Secondary | ICD-10-CM

## 2013-08-26 DIAGNOSIS — IMO0001 Reserved for inherently not codable concepts without codable children: Secondary | ICD-10-CM

## 2013-08-26 DIAGNOSIS — F4321 Adjustment disorder with depressed mood: Secondary | ICD-10-CM

## 2013-08-26 DIAGNOSIS — I83009 Varicose veins of unspecified lower extremity with ulcer of unspecified site: Secondary | ICD-10-CM

## 2013-08-26 NOTE — Progress Notes (Signed)
CC: Kristy Olson is a 80 y.o. 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 She reports that pain comes and goes throughout the day described as a shock sensation localized only to the site of the wound localized on the right medial malleoli.  She denies any discharge, drainage fevers, chills, flushing, nor feelings of being physically unwell. Denies peripheral edema. She was contacted by wound therapy at [facility] however did not get this message until yesterday and has not called back at  Followup bereavement: Patient states she is no better or worse since I saw her last. She reports feelings of sadness and subjective depression mild to moderate in severity relating to her husband's passing months ago. Symptoms are worsened by spending time with her family and discussing old memories, nothing particularly makes symptoms better. She is still interested in seeing a psychiatrist/psychologist, she was contacted while she was gone in [place] however has not called them back in behavioral health.. no thoughts of wanting to harm self or others   Review Of Systems Outlined In HPI  Past Medical History  Diagnosis Date  . Hyperlipidemia   . Thyroid cancer      No family history on file.   History  Substance Use Topics  . Smoking status: Never Smoker   . Smokeless tobacco: Not on file  . Alcohol Use: Yes     Objective: Filed Vitals:   08/26/13 1348  BP: 122/67  Pulse: 65    General: Alert and Oriented, No Acute Distress HEENT: Pupils equal, round, reactive to light. Conjunctivae clear.  Moist mucous membranes Lungs: Clear to auscultation bilaterally, no wheezing/ronchi/rales.  Comfortable work of breathing. Good  air movement. Cardiac: Regular rate and rhythm Extremities: No peripheral edema.  Strong peripheral pulses. On the right medial malleoli there is a 0.6 mm diameter scab without signs of infection there is only scant erythema on the periphery of this. Nontender to touch Mental Status: No depression, anxiety, nor agitation. Skin: Warm and dry.  Assessment & Plan: Kristy Olson was seen today for wound check.  Diagnoses and associated orders for this visit:  Grief  Venous stasis ulcer, right    Grief: I strongly encouraged her to establish with a professional counselor/psychologist, she is open to this idea and I have asked her to call behavioral health back to establish an appointment in the near future.  Discussed ways to navigate through the grieving process including the meantime the family, social interaction, hobbies, keeping oneself busy Venous stasis ulcer: No better but no worse since I saw her last, we covered her wound with a Band-Aid today, anticipate she'll be seen in wound care and the next one or 2 days provided she called them back this afternoon, will defer future care to their team  25 minutes spent face-to-face during visit today of which at least 50% was counseling or coordinating care regarding: 1. Grief   2. Venous stasis ulcer, right         Return if symptoms worsen or fail to improve.

## 2013-08-26 NOTE — Telephone Encounter (Signed)
Pt is here in the office today and she is unsure of her medication dose

## 2013-09-01 ENCOUNTER — Ambulatory Visit (INDEPENDENT_AMBULATORY_CARE_PROVIDER_SITE_OTHER): Payer: Medicare Other | Admitting: Psychology

## 2013-09-01 DIAGNOSIS — F321 Major depressive disorder, single episode, moderate: Secondary | ICD-10-CM

## 2013-09-10 ENCOUNTER — Ambulatory Visit (INDEPENDENT_AMBULATORY_CARE_PROVIDER_SITE_OTHER): Payer: Medicare Other | Admitting: Psychology

## 2013-09-10 DIAGNOSIS — F329 Major depressive disorder, single episode, unspecified: Secondary | ICD-10-CM

## 2013-09-17 ENCOUNTER — Ambulatory Visit: Payer: Medicare Other | Admitting: Psychology

## 2013-09-17 ENCOUNTER — Ambulatory Visit: Payer: Medicare Other | Admitting: Family Medicine

## 2013-09-24 ENCOUNTER — Ambulatory Visit (INDEPENDENT_AMBULATORY_CARE_PROVIDER_SITE_OTHER): Payer: Medicare Other | Admitting: Psychology

## 2013-09-24 DIAGNOSIS — F321 Major depressive disorder, single episode, moderate: Secondary | ICD-10-CM

## 2013-10-01 ENCOUNTER — Ambulatory Visit (INDEPENDENT_AMBULATORY_CARE_PROVIDER_SITE_OTHER): Payer: Medicare Other | Admitting: Psychology

## 2013-10-01 DIAGNOSIS — F321 Major depressive disorder, single episode, moderate: Secondary | ICD-10-CM

## 2013-10-02 DIAGNOSIS — I872 Venous insufficiency (chronic) (peripheral): Secondary | ICD-10-CM | POA: Insufficient documentation

## 2013-10-06 ENCOUNTER — Ambulatory Visit (INDEPENDENT_AMBULATORY_CARE_PROVIDER_SITE_OTHER): Payer: Medicare Other

## 2013-10-06 ENCOUNTER — Ambulatory Visit (INDEPENDENT_AMBULATORY_CARE_PROVIDER_SITE_OTHER): Payer: Medicare Other | Admitting: Family Medicine

## 2013-10-06 ENCOUNTER — Encounter: Payer: Self-pay | Admitting: Family Medicine

## 2013-10-06 VITALS — BP 131/61 | HR 69 | Wt 149.0 lb

## 2013-10-06 DIAGNOSIS — M545 Low back pain, unspecified: Secondary | ICD-10-CM

## 2013-10-06 DIAGNOSIS — L97909 Non-pressure chronic ulcer of unspecified part of unspecified lower leg with unspecified severity: Secondary | ICD-10-CM

## 2013-10-06 DIAGNOSIS — M810 Age-related osteoporosis without current pathological fracture: Secondary | ICD-10-CM | POA: Insufficient documentation

## 2013-10-06 DIAGNOSIS — F4321 Adjustment disorder with depressed mood: Secondary | ICD-10-CM

## 2013-10-06 DIAGNOSIS — M5137 Other intervertebral disc degeneration, lumbosacral region: Secondary | ICD-10-CM

## 2013-10-06 DIAGNOSIS — I83009 Varicose veins of unspecified lower extremity with ulcer of unspecified site: Secondary | ICD-10-CM

## 2013-10-06 DIAGNOSIS — M4120 Other idiopathic scoliosis, site unspecified: Secondary | ICD-10-CM

## 2013-10-06 NOTE — Progress Notes (Signed)
CC: Kristy Olson is a 80 y.o. female is here for Back Pain   Subjective: HPI:  Complains of low back pain localized to the midline it is nonradiating it has been present for the past 3-6 months it comes and goes on a daily basis she is absolutely pain-free right now. It is worse when standing over her kitchen sink which is slightly below her waistline also worse when using low counters.  Improves greatly with extension of the back. Pain is described only has pain moderate in severity she's taken Tylenol once for it which she's unsure whether or not this helped or worsened the pain. Denies saddle paresthesia, bowel or bladder dysfunction, nor motor percent disturbances in her lower extremities. Denies any recent or remote injury. Recently diagnosed with osteoporosis started on Fosamax about a month ago. Denies skin changes, abdominal pain or pelvic pain constipation or diarrhea  Since I saw her last she is establish with wound care at [facility], she tells me that after being cautious about not taking a scab off of her wound it rapidly improved and is now resolved  She is seeing grief counseling and believes that it is helping.  Review Of Systems Outlined In HPI  Past Medical History  Diagnosis Date  . Hyperlipidemia   . Thyroid cancer     Past Surgical History  Procedure Laterality Date  . Cranotomy    . Thyroidectomy     No family history on file.  History   Social History  . Marital Status: Widowed    Spouse Name: N/A    Number of Children: N/A  . Years of Education: N/A   Occupational History  . Not on file.   Social History Main Topics  . Smoking status: Never Smoker   . Smokeless tobacco: Not on file  . Alcohol Use: Yes  . Drug Use: Not on file  . Sexual Activity: Not on file   Other Topics Concern  . Not on file   Social History Narrative  . No narrative on file     Objective: BP 131/61  Pulse 69  Wt 149 lb (67.586 kg)  General: Alert and Oriented, No Acute  Distress HEENT: Pupils equal, round, reactive to light. Conjunctivae clear.   Lungs: Clear to auscultation bilaterally, no wheezing/ronchi/rales.  Comfortable work of breathing. Good air movement. Cardiac: Regular rate and rhythm. Normal S1/S2.  No murmurs, rubs, nor gallops.   Back: Mild midline spinous process tenderness at L4-L5 no paraspinal musculature tenderness. Stork test is negative bilaterally, negative straight leg raise and negative Faber bilaterally, full range of motion and strength of lumbar spine without reproducing her pain with range of motion exercises. L4 and S1 DTRs two over four bilaterally Extremities: No peripheral edema.  Strong peripheral pulses. Full range of motion strength of both lower extremities Mental Status: No depression, anxiety, nor agitation. Skin: Warm and dry.  Assessment & Plan: Kristy Olson was seen today for back pain.  Diagnoses and associated orders for this visit:  Low back pain - DG Lumbar Spine Complete; Future  Osteoporosis - DG Lumbar Spine Complete; Future  Grief  Venous stasis ulcer    Low back pain: Given history of osteoporosis getting plain films to rule out compression fracture, medication recommendations pending film results Osteoporosis: Agree with starting Fosamax Grief: Continues to see counselor Venous stasis ulcer  Return if symptoms worsen or fail to improve.

## 2013-10-08 ENCOUNTER — Encounter: Payer: Self-pay | Admitting: Family Medicine

## 2013-10-08 ENCOUNTER — Ambulatory Visit (INDEPENDENT_AMBULATORY_CARE_PROVIDER_SITE_OTHER): Payer: Medicare Other | Admitting: Psychology

## 2013-10-08 DIAGNOSIS — F321 Major depressive disorder, single episode, moderate: Secondary | ICD-10-CM

## 2013-10-08 DIAGNOSIS — M4120 Other idiopathic scoliosis, site unspecified: Secondary | ICD-10-CM | POA: Insufficient documentation

## 2013-10-08 DIAGNOSIS — M5137 Other intervertebral disc degeneration, lumbosacral region: Secondary | ICD-10-CM | POA: Insufficient documentation

## 2013-10-15 ENCOUNTER — Ambulatory Visit (INDEPENDENT_AMBULATORY_CARE_PROVIDER_SITE_OTHER): Payer: Medicare Other | Admitting: Psychology

## 2013-10-15 DIAGNOSIS — F321 Major depressive disorder, single episode, moderate: Secondary | ICD-10-CM

## 2013-10-29 ENCOUNTER — Ambulatory Visit (INDEPENDENT_AMBULATORY_CARE_PROVIDER_SITE_OTHER): Payer: Medicare Other | Admitting: Psychology

## 2013-10-29 DIAGNOSIS — F321 Major depressive disorder, single episode, moderate: Secondary | ICD-10-CM

## 2013-11-05 ENCOUNTER — Ambulatory Visit (INDEPENDENT_AMBULATORY_CARE_PROVIDER_SITE_OTHER): Payer: Medicare Other | Admitting: Psychology

## 2013-11-05 DIAGNOSIS — F321 Major depressive disorder, single episode, moderate: Secondary | ICD-10-CM

## 2013-11-26 ENCOUNTER — Ambulatory Visit: Payer: Medicare Other | Admitting: Psychology

## 2013-12-23 ENCOUNTER — Encounter: Payer: Self-pay | Admitting: Family Medicine

## 2013-12-23 DIAGNOSIS — I872 Venous insufficiency (chronic) (peripheral): Secondary | ICD-10-CM | POA: Insufficient documentation

## 2013-12-24 ENCOUNTER — Ambulatory Visit (INDEPENDENT_AMBULATORY_CARE_PROVIDER_SITE_OTHER): Payer: Medicare Other | Admitting: Psychology

## 2013-12-24 DIAGNOSIS — F321 Major depressive disorder, single episode, moderate: Secondary | ICD-10-CM

## 2014-01-28 ENCOUNTER — Ambulatory Visit: Payer: Medicare Other | Admitting: Psychology

## 2014-02-11 ENCOUNTER — Ambulatory Visit (INDEPENDENT_AMBULATORY_CARE_PROVIDER_SITE_OTHER): Payer: Medicare Other | Admitting: Psychology

## 2014-02-11 DIAGNOSIS — F321 Major depressive disorder, single episode, moderate: Secondary | ICD-10-CM

## 2014-03-11 ENCOUNTER — Ambulatory Visit (INDEPENDENT_AMBULATORY_CARE_PROVIDER_SITE_OTHER): Payer: Medicare Other | Admitting: Family Medicine

## 2014-03-11 ENCOUNTER — Encounter: Payer: Self-pay | Admitting: Family Medicine

## 2014-03-11 VITALS — BP 138/62 | HR 60 | Ht 66.0 in | Wt 143.0 lb

## 2014-03-11 DIAGNOSIS — Z0000 Encounter for general adult medical examination without abnormal findings: Secondary | ICD-10-CM

## 2014-03-11 DIAGNOSIS — Z131 Encounter for screening for diabetes mellitus: Secondary | ICD-10-CM

## 2014-03-11 DIAGNOSIS — Z13220 Encounter for screening for lipoid disorders: Secondary | ICD-10-CM

## 2014-03-11 DIAGNOSIS — M810 Age-related osteoporosis without current pathological fracture: Secondary | ICD-10-CM

## 2014-03-11 DIAGNOSIS — E039 Hypothyroidism, unspecified: Secondary | ICD-10-CM

## 2014-03-11 DIAGNOSIS — I10 Essential (primary) hypertension: Secondary | ICD-10-CM

## 2014-03-11 LAB — BASIC METABOLIC PANEL
BUN: 20 mg/dL (ref 6–23)
CO2: 29 meq/L (ref 19–32)
Calcium: 8.2 mg/dL — ABNORMAL LOW (ref 8.4–10.5)
Chloride: 103 mEq/L (ref 96–112)
Creat: 0.9 mg/dL (ref 0.50–1.10)
Glucose, Bld: 101 mg/dL — ABNORMAL HIGH (ref 70–99)
Potassium: 4.3 mEq/L (ref 3.5–5.3)
SODIUM: 139 meq/L (ref 135–145)

## 2014-03-11 LAB — LIPID PANEL
Cholesterol: 201 mg/dL — ABNORMAL HIGH (ref 0–200)
HDL: 38 mg/dL — AB (ref >39)
LDL Cholesterol: 121 mg/dL — ABNORMAL HIGH (ref 0–99)
TRIGLYCERIDES: 209 mg/dL — AB (ref <150)
Total CHOL/HDL Ratio: 5.3 Ratio
VLDL: 42 mg/dL — AB (ref 0–40)

## 2014-03-11 LAB — TSH: TSH: 0.157 u[IU]/mL — AB (ref 0.350–4.500)

## 2014-03-11 NOTE — Patient Instructions (Signed)
Dr. Bransyn Adami's General Advice Following Your Complete Physical Exam  The Benefits of Regular Exercise: Unless you suffer from an uncontrolled cardiovascular condition, studies strongly suggest that regular exercise and physical activity will add to both the quality and length of your life.  The World Health Organization recommends 150 minutes of moderate intensity aerobic activity every week.  This is best split over 3-4 days a week, and can be as simple as a brisk walk for just over 35 minutes "most days of the week".  This type of exercise has been shown to lower LDL-Cholesterol, lower average blood sugars, lower blood pressure, lower cardiovascular disease risk, improve memory, and increase one's overall sense of wellbeing.  The addition of anaerobic (or "strength training") exercises offers additional benefits including but not limited to increased metabolism, prevention of osteoporosis, and improved overall cholesterol levels.  How Can I Strive For A Low-Fat Diet?: Current guidelines recommend that 25-35 percent of your daily energy (food) intake should come from fats.  One might ask how can this be achieved without having to dissect each meal on a daily basis?  Switch to skim or 1% milk instead of whole milk.  Focus on lean meats such as ground turkey, fresh fish, baked chicken, and lean cuts of beef as your source of dietary protein.  Limit saturated fat consumption to less than 10% of your daily caloric intake.  Limit trans fatty acid consumption primarily by limiting synthetic trans fats such as partially hydrogenated oils (Ex: fried fast foods).  Substitute olive or vegetable oil for solid fats where possible.  Moderation of Salt Intake: Provided you don't carry a diagnosis of congestive heart failure nor renal failure, I recommend a daily allowance of no more than 2300 mg of salt (sodium).  Keeping under this daily goal is associated with a decreased risk of cardiovascular events, creeping  above it can lead to elevated blood pressures and increases your risk of cardiovascular events.  Milligrams (mg) of salt is listed on all nutrition labels, and your daily intake can add up faster than you think.  Most canned and frozen dinners can pack in over half your daily salt allowance in one meal.    Lifestyle Health Risks: Certain lifestyle choices carry specific health risks.  As you may already know, tobacco use has been associated with increasing one's risk of cardiovascular disease, pulmonary disease, numerous cancers, among many other issues.  What you may not know is that there are medications and nicotine replacement strategies that can more than double your chances of successfully quitting.  I would be thrilled to help manage your quitting strategy if you currently use tobacco products.  When it comes to alcohol use, I've yet to find an "ideal" daily allowance.  Provided an individual does not have a medical condition that is exacerbated by alcohol consumption, general guidelines determine "safe drinking" as no more than two standard drinks for a man or no more than one standard drink for a female per day.  However, much debate still exists on whether any amount of alcohol consumption is technically "safe".  My general advice, keep alcohol consumption to a minimum for general health promotion.  If you or others believe that alcohol, tobacco, or recreational drug use is interfering with your life, I would be happy to provide confidential counseling regarding treatment options.  General "Over The Counter" Nutrition Advice: Postmenopausal women should aim for a daily calcium intake of 1200 mg, however a significant portion of this might already be   provided by diets including milk, yogurt, cheese, and other dairy products.  Vitamin D has been shown to help preserve bone density, prevent fatigue, and has even been shown to help reduce falls in the elderly.  Ensuring a daily intake of 800 Units of  Vitamin D is a good place to start to enjoy the above benefits, we can easily check your Vitamin D level to see if you'd potentially benefit from supplementation beyond 800 Units a day.  Folic Acid intake should be of particular concern to women of childbearing age.  Daily consumption of 400-800 mcg of Folic Acid is recommended to minimize the chance of spinal cord defects in a fetus should pregnancy occur.    For many adults, accidents still remain one of the most common culprits when it comes to cause of death.  Some of the simplest but most effective preventitive habits you can adopt include regular seatbelt use, proper helmet use, securing firearms, and regularly testing your smoke and carbon monoxide detectors.  Jermal Dismuke B. Rhonda Vangieson DO [facility] [address], [address] , [place] [address] Phone: [phone]  

## 2014-03-11 NOTE — Progress Notes (Signed)
Subjective:    Kristy Olson is a 80 y.o. female who presents for Medicare Annual/Subsequent preventive examination.  Preventive Screening-Counseling & Management  Tobacco History  Smoking status  . Never Smoker   Smokeless tobacco  . Not on file    Colonoscopy: Patient has never had any abnormals she believes her last one was sometime 5-10 years ago, since she is over 3 she has elected to no longer get colonoscopies Papsmear: No history of abnormals since she is over 11 we are not going to obtain this today Mammogram: She's never had a mammogram in her life, we discussed risks and benefits of getting mammograms and she would prefer to not get one  DEXA: She believes her DEXA scan showing osteoporosis was in the summer of last year we'll plan on getting another one next year while she continues on Fosamax  Influenza Vaccine: Encouraged her to get the flu shot when the season arrives Pneumovax: She's unsure whether or not she's had this or Prevnar since she turns 55 I've asked her to look into her records Td/Tdap: She's unsure of the last time she had tetanus booster I've asked her to look into her records it's been greater than 10 years we will get her Zoster: She is uncertain if she ever got this, she'll need to look into her records  No mention regarding above immunizations on outside records obtained in the winter  Problems Prior to Visit 1. Hypothyroidism, Osteoporosis  Current Problems (verified) Patient Active Problem List   Diagnosis Date Noted  . Chronic venous insufficiency 12/23/2013  . Degeneration of lumbar or lumbosacral intervertebral disc 10/08/2013  . Idiopathic scoliosis 10/08/2013  . Osteoporosis 10/06/2013  . Hypothyroid 08/18/2013  . Essential hypertension, benign 08/18/2013  . Venous stasis ulcer 08/06/2013  . Grief 08/06/2013  . HYPERLIPIDEMIA 08/15/2010  . PNEUMONIA, COMMUNITY ACQUIRED, PNEUMOCOCCAL 08/15/2010    Medications Prior to  Visit Current Outpatient Prescriptions on File Prior to Visit  Medication Sig Dispense Refill  . alendronate (FOSAMAX) 70 MG tablet Take 1 tablet (70 mg total) by mouth every 7 (seven) days. Take with a full glass of water on an empty stomach.  4 tablet  0  . aspirin 81 MG tablet Take 81 mg by mouth daily.      Marland Kitchen levothyroxine (SYNTHROID, LEVOTHROID) 100 MCG tablet Take 100 mcg by mouth daily before breakfast.      . metoprolol succinate (TOPROL-XL) 100 MG 24 hr tablet Take 100 mg by mouth daily. Take with or immediately following a meal.      . VITAMIN D, ERGOCALCIFEROL, PO Take by mouth.      . lidocaine (XYLOCAINE JELLY) 2 % jelly Apply to affected area daily prn  30 mL  1   No current facility-administered medications on file prior to visit.    Current Medications (verified) Current Outpatient Prescriptions  Medication Sig Dispense Refill  . alendronate (FOSAMAX) 70 MG tablet Take 1 tablet (70 mg total) by mouth every 7 (seven) days. Take with a full glass of water on an empty stomach.  4 tablet  0  . aspirin 81 MG tablet Take 81 mg by mouth daily.      Marland Kitchen levothyroxine (SYNTHROID, LEVOTHROID) 100 MCG tablet Take 100 mcg by mouth daily before breakfast.      . metoprolol succinate (TOPROL-XL) 100 MG 24 hr tablet Take 100 mg by mouth daily. Take with or immediately following a meal.      . VITAMIN D, ERGOCALCIFEROL, PO Take  by mouth.      . lidocaine (XYLOCAINE JELLY) 2 % jelly Apply to affected area daily prn  30 mL  1   No current facility-administered medications for this visit.     Allergies (verified) Amoxicillin; Ciprofloxacin; Colesevelam; Doxycycline; Levofloxacin; and Statins   PAST HISTORY  Family History No family history on file.  Social History History  Substance Use Topics  . Smoking status: Never Smoker   . Smokeless tobacco: Not on file  . Alcohol Use: Yes     Are there smokers in your home (other than you)? No  Risk Factors Current exercise habits: The  patient does not participate in regular exercise at present.  Dietary issues discussed: DASH Diet   Cardiac risk factors: advanced age (older than 82 for men, 32 for women), dyslipidemia and hypertension.  Depression Screen (Note: if answer to either of the following is "Yes", a more complete depression screening is indicated)   Over the past two weeks, have you felt down, depressed or hopeless? No  Over the past two weeks, have you felt little interest or pleasure in doing things? No  Have you lost interest or pleasure in daily life? No  Do you often feel hopeless? No  Do you cry easily over simple problems? No  Activities of Daily Living In your present state of health, do you have any difficulty performing the following activities?:  Driving? No Managing money?  No Feeding yourself? No Getting from bed to chair? No Climbing a flight of stairs? No Preparing food and eating?: No Bathing or showering? No Getting dressed: No Getting to the toilet? No Using the toilet:No Moving around from place to place: No In the past year have you fallen or had a near fall?:No   Are you sexually active?  No  Do you have more than one partner?  No  Hearing Difficulties: No Do you often ask people to speak up or repeat themselves? No Do you experience ringing or noises in your ears? No Do you have difficulty understanding soft or whispered voices? No   Do you feel that you have a problem with memory? No  Do you often misplace items? No  Do you feel safe at home?  Yes  Cognitive Testing  Alert? Yes  Normal Appearance?Yes  Oriented to person? Yes  Place? Yes   Time? Yes  Recall of three objects?  Yes  Can perform simple calculations? Yes  Displays appropriate judgment?Yes  Can read the correct time from a watch face?Yes   Advanced Directives have been discussed with the patient? Yes  List the Names of Other Physician/Practitioners you currently use: 1.    Indicate any recent Medical  Services you may have received from other than [facility] providers in the past year (date may be approximate).   There is no immunization history on file for this patient.  Screening Tests Health Maintenance  Topic Date Due  . Tetanus/tdap  10/11/1952  . Colonoscopy  10/12/1983  . Zostavax  10/11/1993  . Pneumococcal Polysaccharide Vaccine Age 52 And Over  10/11/1998  . Influenza Vaccine  03/21/2014    All answers were reviewed with the patient and necessary referrals were made:  Marcial Pacas, DO   03/11/2014   History reviewed: allergies, current medications, past family history, past medical history, past social history, past surgical history and problem list  Review of Systems Review of Systems - General ROS: negative for - chills, fever, night sweats, weight gain or weight loss Ophthalmic  ROS: negative for - decreased vision Psychological ROS: negative for - anxiety or depression ENT ROS: negative for - hearing change, nasal congestion, tinnitus or allergies Hematological and Lymphatic ROS: negative for - bleeding problems, bruising or swollen lymph nodes Breast ROS: negative Respiratory ROS: no cough, shortness of breath, or wheezing Cardiovascular ROS: no chest pain or dyspnea on exertion Gastrointestinal ROS: no abdominal pain, change in bowel habits, or black or bloody stools Genito-Urinary ROS: negative for - genital discharge, genital ulcers, incontinence or abnormal bleeding from genitals Musculoskeletal ROS: negative for - joint pain or muscle pain Neurological ROS: negative for - headaches or memory loss Dermatological ROS: negative for lumps, mole changes, rash and skin lesion changes  Objective:     Vision by Snellen chart: right eye:20/70, left eye:20/25  Body mass index is 23.09 kg/(m^2). BP 138/62  Pulse 60  Ht 5\' 6"  (1.676 m)  Wt 143 lb (64.864 kg)  BMI 23.09 kg/m2  Vital signs reviewed. General: Alert and Oriented, No Acute Distress HEENT: Pupils equal,  round, reactive to light. Conjunctivae clear.  External ears unremarkable.  Moist mucous membranes. Lungs: Clear and comfortable work of breathing, speaking in full sentences without accessory muscle use. No wheezing rhonchi or rales Cardiac: Regular rate and rhythm. No murmurs rubs or gallops Neuro: CN II-XII grossly intact, gait normal. Extremities: No peripheral edema.  Strong peripheral pulses.  Mental Status: No depression, anxiety, nor agitation. Logical though process. Skin: Warm and dry.     Assessment:     Poor vision, likely overdue on multiple immunizations I like her to looking at her old records.      Plan:     During the course of the visit the patient was educated and counseled about appropriate screening and preventive services including:    Screening mammography  Bone densitometry screening  Nutrition counseling   Diet review for nutrition referral? no  Patient Instructions (the written plan) was given to the patient.  Medicare Attestation I have personally reviewed: The patient's medical and social history Their use of alcohol, tobacco or illicit drugs Their current medications and supplements The patient's functional ability including ADLs,fall risks, home safety risks, cognitive, and hearing and visual impairment Diet and physical activities Evidence for depression or mood disorders  The patient's weight, height, BMI, and visual acuity have been recorded in the chart.  I have made referrals, counseling, and provided education to the patient based on review of the above and I have provided the patient with a written personalized care plan for preventive services.     Marcial Pacas, DO   03/11/2014   Per her request I've referred her to endocrinology and cardiology but I did offer her the option of having our office manage these issues. Due for routine dyslipidemia diabetic screening and thyroid function

## 2014-03-12 ENCOUNTER — Telehealth: Payer: Self-pay | Admitting: Family Medicine

## 2014-03-12 DIAGNOSIS — E890 Postprocedural hypothyroidism: Secondary | ICD-10-CM

## 2014-03-12 DIAGNOSIS — Z808 Family history of malignant neoplasm of other organs or systems: Secondary | ICD-10-CM

## 2014-03-12 MED ORDER — LEVOTHYROXINE SODIUM 137 MCG PO TABS: 137.0000 ug | ORAL_TABLET | Freq: Every day | ORAL | Status: DC

## 2014-03-12 NOTE — Telephone Encounter (Signed)
Daughter notified; pt request brand only of thyroid medication. Called pharm and left a message on vm to change rx to Synthroid same directions and and dose and sent in earlier today

## 2014-03-12 NOTE — Telephone Encounter (Signed)
The daughter confirmed that pt is taking 0.15 mcg 1 qd of Synthroid. Daughter notified of results. Also the daughter wanted recommendation of a dermatology in town. I did tell her [facility] has an office in [place]. Would like referral. Has had several abnormal lesions removed in the past

## 2014-03-12 NOTE — Telephone Encounter (Signed)
Smaller dose of levothyroxine sent to [facility] pharmacy. The dose of 150 mcg appears to be too high, new dose will be 137 mcg. I recommend her thyroid function be rechecked in 3 months.  Dermatology referral has been placed as of today

## 2014-03-12 NOTE — Addendum Note (Signed)
Addended by: Marcial Pacas on: 03/12/2014 12:47 PM   Modules accepted: Orders

## 2014-03-12 NOTE — Telephone Encounter (Signed)
Seth Bake, Will you please let patient know that her thyroid supplementation appears to be too high of a dose.  Can she please confirm what her current levothyroxine dose is, there is a discrepency between our records and that which is in the [facility] system. I'll be sending in a lower dose once I'm aware of what she's currently taking.  Triglycerides and cholesterol were mildly elevated.  No to a degree that warrants medication at this time.  This can be improved with engaging in 30-45 minutes of moderate exercise most days of the week.

## 2014-03-16 ENCOUNTER — Telehealth: Payer: Self-pay | Admitting: Family Medicine

## 2014-03-16 NOTE — Telephone Encounter (Signed)
Patient walked in and request to get a referral to Endocrinologist and request to get an Mri for head to check cancer. Patient request a call back [phone]. Thanks

## 2014-03-17 NOTE — Telephone Encounter (Signed)
Called both # and no answer vm had nt been set up on one of the numbers. Pt is going to have to give [place] more info so referral can be associated with a dx. If she is concerned about cancer she needs an appt

## 2014-03-19 NOTE — Telephone Encounter (Signed)
Left message on pt's home # that more info is needed and should schedule an appt to discuss concerns with Dr. Ileene Rubens

## 2014-03-27 ENCOUNTER — Telehealth: Payer: Self-pay | Admitting: *Deleted

## 2014-03-27 ENCOUNTER — Other Ambulatory Visit: Payer: Self-pay | Admitting: *Deleted

## 2014-03-27 DIAGNOSIS — C7931 Secondary malignant neoplasm of brain: Secondary | ICD-10-CM

## 2014-03-27 DIAGNOSIS — Z85850 Personal history of malignant neoplasm of thyroid: Secondary | ICD-10-CM | POA: Insufficient documentation

## 2014-03-27 DIAGNOSIS — C73 Malignant neoplasm of thyroid gland: Secondary | ICD-10-CM

## 2014-03-27 MED ORDER — ALENDRONATE SODIUM 70 MG PO TABS: 70.0000 mg | ORAL_TABLET | ORAL | Status: DC

## 2014-03-27 NOTE — Telephone Encounter (Signed)
Order has been placed.

## 2014-03-27 NOTE — Telephone Encounter (Signed)
(  referral to endocrinologist was place on 22nd. Pt has been scheduled for this.) pt would like an order for a brain MRI. She has a hx of thyroid cancer. Per pt she has had two craniotomies and has to have a brain MRI one a year to monitor for cancer. Is this something that you can place an order for?

## 2014-03-30 NOTE — Telephone Encounter (Signed)
Message left on vm 

## 2014-04-03 ENCOUNTER — Telehealth: Payer: Self-pay | Admitting: *Deleted

## 2014-04-03 MED ORDER — DIAZEPAM 5 MG PO TABS: ORAL_TABLET | ORAL | Status: DC

## 2014-04-03 NOTE — Telephone Encounter (Signed)
I have faxed valium to her pharmacy on file.

## 2014-04-03 NOTE — Telephone Encounter (Signed)
Pt is having an MRI early in the am. Pt states she needs something to calm her down because she is claustrophobic.

## 2014-04-04 ENCOUNTER — Ambulatory Visit (HOSPITAL_BASED_OUTPATIENT_CLINIC_OR_DEPARTMENT_OTHER)
Admission: RE | Admit: 2014-04-04 | Discharge: 2014-04-04 | Disposition: A | Discharge status: Home / Self Care | Payer: Medicare Other | Source: Ambulatory Visit | Attending: Family Medicine | Admitting: Family Medicine

## 2014-04-04 DIAGNOSIS — Z9889 Other specified postprocedural states: Secondary | ICD-10-CM | POA: Insufficient documentation

## 2014-04-04 DIAGNOSIS — C7949 Secondary malignant neoplasm of other parts of nervous system: Principal | ICD-10-CM

## 2014-04-04 DIAGNOSIS — I6789 Other cerebrovascular disease: Secondary | ICD-10-CM | POA: Diagnosis not present

## 2014-04-04 DIAGNOSIS — G9389 Other specified disorders of brain: Secondary | ICD-10-CM | POA: Insufficient documentation

## 2014-04-04 DIAGNOSIS — Z85850 Personal history of malignant neoplasm of thyroid: Secondary | ICD-10-CM | POA: Insufficient documentation

## 2014-04-04 DIAGNOSIS — C73 Malignant neoplasm of thyroid gland: Secondary | ICD-10-CM

## 2014-04-04 DIAGNOSIS — C7931 Secondary malignant neoplasm of brain: Secondary | ICD-10-CM | POA: Diagnosis present

## 2014-04-04 MED ORDER — GADOBENATE DIMEGLUMINE 529 MG/ML IV SOLN: 13.0000 mL | Freq: Once | INTRAVENOUS | Status: AC | PRN

## 2014-04-06 ENCOUNTER — Telehealth: Payer: Self-pay | Admitting: Family Medicine

## 2014-04-06 NOTE — Telephone Encounter (Signed)
Message left on v/m to call back

## 2014-04-06 NOTE — Telephone Encounter (Signed)
Seth Bake, Will you please ask patient what doctor's office ordered her prior brain MRIs, specifically from 2014 and 2013? Can you also request those results for comparison to her MRI from over the weekend?

## 2014-04-09 NOTE — Telephone Encounter (Signed)
Left another message on vm for pt call back and leave info on vm

## 2014-04-13 NOTE — Telephone Encounter (Signed)
Faxed over request to Dr. Rosamaria Lints office at [phone]

## 2014-04-14 ENCOUNTER — Encounter: Payer: Self-pay | Admitting: Endocrinology

## 2014-04-14 ENCOUNTER — Telehealth: Payer: Self-pay | Admitting: Family Medicine

## 2014-04-14 ENCOUNTER — Ambulatory Visit (INDEPENDENT_AMBULATORY_CARE_PROVIDER_SITE_OTHER): Payer: Medicare Other | Admitting: Endocrinology

## 2014-04-14 VITALS — BP 120/62 | HR 63 | Temp 97.6°F | Ht 66.0 in | Wt 142.0 lb

## 2014-04-14 DIAGNOSIS — R9089 Other abnormal findings on diagnostic imaging of central nervous system: Secondary | ICD-10-CM | POA: Insufficient documentation

## 2014-04-14 DIAGNOSIS — C73 Malignant neoplasm of thyroid gland: Secondary | ICD-10-CM

## 2014-04-14 DIAGNOSIS — Z85850 Personal history of malignant neoplasm of thyroid: Secondary | ICD-10-CM

## 2014-04-14 NOTE — Patient Instructions (Addendum)
Please sign release of information from [facility]. blood tests are being requested for you today.  We'll contact you with results. Please increase the levothyroxine back to 150 mcg/day. Please continue the same alendronate. Please come back for a follow-up appointment in 3 months.

## 2014-04-14 NOTE — Telephone Encounter (Signed)
Seth Bake, Will you please let patient know that I've reviewed her brain MRI from 2013 and when compared to her MRI this year there are some changes that the radiologist can not rule out whether or not is a reoccurrence of her brain metastasis. I've placed a referral for her to discuss further workup with a neurosurgeon who would have more experience deciphering her new MRI.  (There is no abnormal contrast enhancement present in the temporal lobe from a brain MRI within the [facility] in 2013.)  Report placed in scan folder.

## 2014-04-14 NOTE — Progress Notes (Signed)
Subjective:    Patient ID: Kristy Olson, female    DOB: 05-07-1932, 80 y.o.   MRN: [id number]  HPI In 2005, pt presented with a slight nodule at the right temporal area, but no assoc pain.  The area was rescected at [facility], and was found to contain thyroid cancer.  She then underwent thyroidectomy.  She says she then had 2 doses of I-131 rx (2006 and 2007).  In 2008, she developed right proptosis, and was found to have recurrent tumor there.  This was rescected, and she then underwent XRT to the area.   Past Medical History  Diagnosis Date  . Hyperlipidemia   . Thyroid cancer     Past Surgical History  Procedure Laterality Date  . Cranotomy    . Thyroidectomy      History   Social History  . Marital Status: Widowed    Spouse Name: N/A    Number of Children: N/A  . Years of Education: N/A   Occupational History  . Not on file.   Social History Main Topics  . Smoking status: Never Smoker   . Smokeless tobacco: Not on file  . Alcohol Use: Yes  . Drug Use: Not on file  . Sexual Activity: Not on file   Other Topics Concern  . Not on file   Social History Narrative  . No narrative on file    Current Outpatient Prescriptions on File Prior to Visit  Medication Sig Dispense Refill  . alendronate (FOSAMAX) 70 MG tablet Take 1 tablet (70 mg total) by mouth every 7 (seven) days. Take with a full glass of water on an empty stomach.  12 tablet  0  . aspirin 81 MG tablet Take 81 mg by mouth daily.      . metoprolol succinate (TOPROL-XL) 100 MG 24 hr tablet Take 100 mg by mouth daily. Take with or immediately following a meal.      . VITAMIN D, ERGOCALCIFEROL, PO Take by mouth.      . diazepam (VALIUM) 5 MG tablet 1-2 tablets 30 minutes prior to MRI as needed for anxiety, do not drive for 12 hours after taking.  2 tablet  0  . lidocaine (XYLOCAINE JELLY) 2 % jelly Apply to affected area daily prn  30 mL  1   No current facility-administered medications on  file prior to visit.   Allergies  Allergen Reactions  . Amoxicillin   . Ciprofloxacin   . Colesevelam   . Doxycycline   . Levofloxacin   . Statins     Hives   No family history on file.  BP 120/62  Pulse 63  Temp(Src) 97.6 F (36.4 C) (Oral)  Ht 5\' 6"  (1.676 m)  Wt 142 lb (64.411 kg)  BMI 22.93 kg/m2  SpO2 97%  Review of Systems denies weight loss, headache, hoarseness, double vision, palpitations, sob, diarrhea, polyuria, myalgias, excessive diaphoresis, tremor, cold intolerance, and easy bruising.  She has anxiety and rhinorrhea.    Objective:   Physical Exam VS: see vs page GEN: no distress HEAD: head: no deformity, except for a surgical defect at the right temporal area. eyes: no periorbital swelling, no proptosis external nose and ears are normal mouth: no lesion seen NECK: a healed scar is present.  i do not appreciate a nodule in the thyroid or elsewhere in the neck CHEST WALL: no deformity. LUNGS:  Clear to auscultation.   CV: reg rate and rhythm, no murmur ABD: abdomen is  soft, nontender.  no hepatosplenomegaly.  not distended.  no hernia MUSCULOSKELETAL: muscle bulk and strength are grossly normal.  no obvious joint swelling.  gait is normal and steady EXTEMITIES: no deformity.  no ulcer on the feet.  feet are of normal color and temp.  no edema PULSES: dorsalis pedis intact bilat.  no carotid bruit NEURO:  cn 2-12 grossly intact.   readily moves all 4's.  sensation is intact to touch on the feet SKIN:  Normal texture and temperature.  No rash or suspicious lesion is visible.   NODES:  None palpable at the neck PSYCH: alert, well-oriented.  Does not appear anxious nor depressed.   Lab Results  Component Value Date   TSH 0.157* 03/11/2014   TG=2  MRI: no evidence of brain mets.  i have reviewed the following outside records: Office notes    Assessment & Plan:  Stage-4 differentiated thyroid cancer, mild exacerbation.  In view of this, she can be  followed for now.   Postsurgical hypothyroidism, new to me; view of her cancer, she needs to have a slightly suppressed TSH. Osteoporosis.  This could be exacerbated by a suppressed, so she needs to continue the fosamax.   Patient is advised the following: Patient Instructions  Please sign release of information from [facility]. blood tests are being requested for you today.  We'll contact you with results. Please increase the levothyroxine back to 150 mcg/day. Please continue the same alendronate. Please come back for a follow-up appointment in 3 months.

## 2014-04-14 NOTE — Telephone Encounter (Signed)
Left message for pt to call back  °

## 2014-04-15 LAB — THYROGLOBULIN LEVEL: THYROGLOBULIN: 1.9 ng/mL — AB (ref 2.8–40.9)

## 2014-04-15 LAB — THYROGLOBULIN ANTIBODY: Thyroglobulin Ab: <1 IU/mL (ref <2)

## 2014-04-16 NOTE — Telephone Encounter (Signed)
Called and pt notified her of below reccomendations

## 2014-04-17 ENCOUNTER — Encounter: Payer: Self-pay | Admitting: *Deleted

## 2014-04-17 ENCOUNTER — Encounter: Payer: Self-pay | Admitting: Cardiology

## 2014-04-17 ENCOUNTER — Encounter: Payer: Self-pay | Admitting: Family Medicine

## 2014-04-29 ENCOUNTER — Encounter: Payer: Self-pay | Admitting: Cardiology

## 2014-04-29 ENCOUNTER — Ambulatory Visit (INDEPENDENT_AMBULATORY_CARE_PROVIDER_SITE_OTHER): Payer: Medicare Other | Admitting: Cardiology

## 2014-04-29 VITALS — BP 120/58 | HR 57 | Ht 66.0 in | Wt 141.8 lb

## 2014-04-29 DIAGNOSIS — I2510 Atherosclerotic heart disease of native coronary artery without angina pectoris: Secondary | ICD-10-CM

## 2014-04-29 DIAGNOSIS — E785 Hyperlipidemia, unspecified: Secondary | ICD-10-CM

## 2014-04-29 DIAGNOSIS — I10 Essential (primary) hypertension: Secondary | ICD-10-CM

## 2014-04-29 NOTE — Assessment & Plan Note (Signed)
Blood pressure controlled. Continue present medications. 

## 2014-04-29 NOTE — Assessment & Plan Note (Addendum)
Continue aspirin and Toprol. Intolerant to statins. We will obtain all previous records concerning previous cardiac interventions.

## 2014-04-29 NOTE — Patient Instructions (Signed)
Your physician wants you to follow-up in: [place] DR. CRENSHAW. You will receive a reminder letter in the mail two months in advance. If you don't receive a letter, please call our office to schedule the follow-up appointment.   Your physician recommends that you continue on your current medications as directed. Please refer to the Current Medication list given to you today.

## 2014-04-29 NOTE — Assessment & Plan Note (Signed)
Intolerant to statins. Continue diet. 

## 2014-04-29 NOTE — Progress Notes (Signed)
HPI: 80 year old female to establish for coronary artery disease. Patient has had previous stents placed in [place]. I have none of those records available. She apparently has had intermittent chest pain for quite some time. It is diffuse in the substernal area without radiation. It is not pleuritic, positional or exertional. It resolves spontaneously. She does not have dyspnea on exertion, orthopnea, PND, pedal edema, syncope or exertional chest pain. She has had previous evaluation for the above symptoms.  Current Outpatient Prescriptions  Medication Sig Dispense Refill  . alendronate (FOSAMAX) 70 MG tablet Take 1 tablet (70 mg total) by mouth every 7 (seven) days. Take with a full glass of water on an empty stomach.  12 tablet  0  . aspirin 81 MG tablet Take 81 mg by mouth daily.      . calcium-vitamin D (OSCAL WITH D) 250-125 MG-UNIT per tablet Take 2 tablets by mouth daily.      Marland Kitchen levothyroxine (SYNTHROID, LEVOTHROID) 150 MCG tablet Take 150 mcg by mouth daily before breakfast.      . metoprolol succinate (TOPROL-XL) 100 MG 24 hr tablet Take 100 mg by mouth daily. Take with or immediately following a meal.      . triamcinolone ointment (KENALOG) 0.1 % as needed.      Marland Kitchen VITAMIN D, ERGOCALCIFEROL, PO Take by mouth.       No current facility-administered medications for this visit.    Allergies  Allergen Reactions  . Amoxicillin   . Ciprofloxacin   . Colesevelam   . Doxycycline   . Levofloxacin   . Statins     Hives    Past Medical History  Diagnosis Date  . Hyperlipidemia   . Chronic venous insufficiency     Compression stockings, Dr. Carmine Savoy to evaluate late May 2015   . Grief   . Hypothyroid     Outside records report synthroid 124mcg despite her report of 155mcg. 02/2014 awaiting clarification   . Osteoporosis     July 2014 DEXA   . PNEUMONIA, COMMUNITY ACQUIRED, PNEUMOCOCCAL   . Venous stasis ulcer   . Degeneration of lumbar or lumbosacral intervertebral  disc   . Idiopathic scoliosis   . History of thyroid cancer     With reported brain mets.   . Abnormal finding on MRI of brain   . CAD (coronary artery disease)     Past Surgical History  Procedure Laterality Date  . Cranotomy    . Thyroidectomy      History   Social History  . Marital Status: Widowed    Spouse Name: N/A    Number of Children: 2  . Years of Education: N/A   Occupational History  .     Social History Main Topics  . Smoking status: Never Smoker   . Smokeless tobacco: Not on file  . Alcohol Use: Yes     Comment: Occasional  . Drug Use: Not on file  . Sexual Activity: Not on file   Other Topics Concern  . Not on file   Social History Narrative  . No narrative on file    Family History  Problem Relation Age of Onset  . CAD Sister     ROS: no fevers or chills, productive cough, hemoptysis, dysphasia, odynophagia, melena, hematochezia, dysuria, hematuria, rash, seizure activity, orthopnea, PND, pedal edema, claudication. Remaining systems are negative.  Physical Exam:   Blood pressure 120/58, pulse 57, height 5\' 6"  (1.676 m), weight 141 lb 12.8 oz (  64.32 kg).  General:  Well developed/well nourished in NAD Skin warm/dry Patient not depressed No peripheral clubbing Back-normal HEENT-normal/normal eyelids Neck supple/normal carotid upstroke bilaterally; no bruits; no JVD; no thyromegaly chest - CTA/ normal expansion CV - RRR/normal S1 and S2; no rubs or gallops;  PMI nondisplaced, 2/6 systolic ejection murmur Abdomen -NT/ND, no HSM, no mass, + bowel sounds, no bruit 2+ femoral pulses, no bruits Ext-no edema, chords, 2+ DP Neuro-grossly nonfocal  ECG Sinus rhythm at a rate of 57. No ST changes

## 2014-05-04 ENCOUNTER — Encounter: Payer: Self-pay | Admitting: Family Medicine

## 2014-05-06 ENCOUNTER — Ambulatory Visit (INDEPENDENT_AMBULATORY_CARE_PROVIDER_SITE_OTHER): Payer: 59 | Admitting: Psychology

## 2014-05-06 DIAGNOSIS — F321 Major depressive disorder, single episode, moderate: Secondary | ICD-10-CM

## 2014-05-25 ENCOUNTER — Ambulatory Visit (INDEPENDENT_AMBULATORY_CARE_PROVIDER_SITE_OTHER): Payer: 59 | Admitting: Psychology

## 2014-05-25 DIAGNOSIS — F322 Major depressive disorder, single episode, severe without psychotic features: Secondary | ICD-10-CM

## 2014-06-08 ENCOUNTER — Ambulatory Visit (INDEPENDENT_AMBULATORY_CARE_PROVIDER_SITE_OTHER): Payer: 59 | Admitting: Psychology

## 2014-06-08 DIAGNOSIS — F322 Major depressive disorder, single episode, severe without psychotic features: Secondary | ICD-10-CM

## 2014-06-15 ENCOUNTER — Encounter: Payer: Self-pay | Admitting: Family Medicine

## 2014-06-15 ENCOUNTER — Ambulatory Visit (INDEPENDENT_AMBULATORY_CARE_PROVIDER_SITE_OTHER): Payer: Medicare Other | Admitting: Family Medicine

## 2014-06-15 VITALS — BP 109/56 | HR 57 | Wt 146.0 lb

## 2014-06-15 DIAGNOSIS — S80811A Abrasion, right lower leg, initial encounter: Secondary | ICD-10-CM

## 2014-06-15 DIAGNOSIS — I10 Essential (primary) hypertension: Secondary | ICD-10-CM

## 2014-06-15 DIAGNOSIS — M810 Age-related osteoporosis without current pathological fracture: Secondary | ICD-10-CM

## 2014-06-15 DIAGNOSIS — E890 Postprocedural hypothyroidism: Secondary | ICD-10-CM

## 2014-06-15 DIAGNOSIS — M5137 Other intervertebral disc degeneration, lumbosacral region: Secondary | ICD-10-CM

## 2014-06-15 NOTE — Progress Notes (Signed)
CC: Kristy Olson is a 80 y.o. female is here for f/u thyroid   Subjective: HPI:   follow-up essential hypertension: Continues to take metoprolol on a daily basis with no outside blood pressures report. Denies chest pain shortness of breath orthopnea nor peripheral edema.  Follow-up hypothyroidism: She continues to take 150 g of levothyroxine on a daily basis. She established with Dr. [name] since I saw her last and had favorable lab results showing low risk of active thyroid cancer. She denies any unintentional weight gain or loss nor GI disturbance.  She continues to complain of midline low back pain that is nonradiating that is only present after periods of inactivity. It is described as mild, annoying, and improves with any activity or stretching of the back. She denies any radiation. Denies bowel or bladder incontinence or saddle paresthesia. Symptoms have not worsened since we had x-rays earlier this year.  Her right shin came in contact with a metal plate a week and a half ago and she has a scab she wants no whether or not it is infected. Bleeding stopped the day of the accident, she denies any pain or redness nor discharge for the past week. She is currently not doing anything to treat the wound other than keeping it clean. She denies fevers, chills or swollen lymph nodes  She has many well thought out questions regarding what exercises she can do to help osteoporosis. She is currently walking 3 days a week, going to the gym for Silver sneakers once a week.   Review Of Systems Outlined In HPI  Past Medical History  Diagnosis Date  . Hyperlipidemia   . Chronic venous insufficiency     Compression stockings, Dr. Carmine Savoy to evaluate late May 2015   . Grief   . Hypothyroid     Outside records report synthroid 137mcg despite her report of 122mcg. 02/2014 awaiting clarification   . Osteoporosis     July 2014 DEXA   . PNEUMONIA, COMMUNITY ACQUIRED, PNEUMOCOCCAL   . Venous stasis  ulcer   . Degeneration of lumbar or lumbosacral intervertebral disc   . Idiopathic scoliosis   . History of thyroid cancer     With reported brain mets.   . Abnormal finding on MRI of brain   . CAD (coronary artery disease)     Past Surgical History  Procedure Laterality Date  . Cranotomy    . Thyroidectomy     Family History  Problem Relation Age of Onset  . CAD Sister     History   Social History  . Marital Status: Widowed    Spouse Name: N/A    Number of Children: 2  . Years of Education: N/A   Occupational History  .     Social History Main Topics  . Smoking status: Never Smoker   . Smokeless tobacco: Not on file  . Alcohol Use: Yes     Comment: Occasional  . Drug Use: Not on file  . Sexual Activity: Not on file   Other Topics Concern  . Not on file   Social History Narrative  . No narrative on file     Objective: BP 109/56  Pulse 57  Wt 146 lb (66.225 kg)  General: Alert and Oriented, No Acute Distress HEENT: Pupils equal, round, reactive to light. Conjunctivae clear.  Moist pedis membranes times unremarkable Lungs: Clear to auscultation bilaterally, no wheezing/ronchi/rales.  Comfortable work of breathing. Good air movement. Cardiac: Regular rate and rhythm. Normal S1/S2.  No  murmurs, rubs, nor gallops.   Extremities: No peripheral edema.  Strong peripheral pulses.  Mental Status: No depression, anxiety, nor agitation. Skin: Warm and dry. 3 cm linear vertical well-healing abrasion with scab on the right shin without surrounding erythema or fluctuance/induration  Assessment & Plan: Tacia was seen today for f/u thyroid.  Diagnoses and associated orders for this visit:  Essential hypertension, benign  Postoperative hypothyroidism  Degeneration of lumbar or lumbosacral intervertebral disc  Leg abrasion, right, initial encounter  Osteoporosis    Essential hypertension: Controlled continue metoprolol Hypothyroidism: Controlled continue  endocrinology recommendations of continuing levothyroxine 150 mcg. she tells me that she was not informed of her recent thyroid tests so we took the time to go over them today Degenerative disc disease: Controlled, I discussed exercises that she can incorporate at the gym to help focus on her core which will help with her back pain Leg abrasion: Reassurance provided no signs of infection keep clean and covered until fully healed Osteoporosis: Time was taken to answer all of her questions regarding bone health. Discussed calcium and vitamin D supplementation, weightbearing activity, exercising most days of the week, and doing some light upper extremity weight lifting at the gym at least on a weekly basis. 40 minutes spent face-to-face during visit today of which at least 50% was counseling or coordinating care regarding: 1. Essential hypertension, benign   2. Postoperative hypothyroidism   3. Degeneration of lumbar or lumbosacral intervertebral disc   4. Leg abrasion, right, initial encounter   5. Osteoporosis      Return in about 6 months (around 12/15/2014) for HTN, Thyroid.

## 2014-06-19 ENCOUNTER — Other Ambulatory Visit: Payer: Self-pay | Admitting: Family Medicine

## 2014-07-06 ENCOUNTER — Ambulatory Visit (INDEPENDENT_AMBULATORY_CARE_PROVIDER_SITE_OTHER): Payer: 59 | Admitting: Psychology

## 2014-07-06 DIAGNOSIS — F322 Major depressive disorder, single episode, severe without psychotic features: Secondary | ICD-10-CM

## 2014-07-21 ENCOUNTER — Ambulatory Visit (INDEPENDENT_AMBULATORY_CARE_PROVIDER_SITE_OTHER): Payer: Medicare Other | Admitting: Endocrinology

## 2014-07-21 ENCOUNTER — Encounter: Payer: Self-pay | Admitting: Endocrinology

## 2014-07-21 ENCOUNTER — Telehealth: Payer: Self-pay | Admitting: Endocrinology

## 2014-07-21 VITALS — BP 118/78 | HR 67 | Temp 97.8°F | Ht 66.0 in | Wt 142.0 lb

## 2014-07-21 DIAGNOSIS — Z85850 Personal history of malignant neoplasm of thyroid: Secondary | ICD-10-CM

## 2014-07-21 DIAGNOSIS — E890 Postprocedural hypothyroidism: Secondary | ICD-10-CM | POA: Insufficient documentation

## 2014-07-21 DIAGNOSIS — C73 Malignant neoplasm of thyroid gland: Secondary | ICD-10-CM

## 2014-07-21 LAB — TSH: TSH: 0.06 u[IU]/mL — ABNORMAL LOW (ref 0.35–4.50)

## 2014-07-21 NOTE — Patient Instructions (Addendum)
blood tests are being requested for you today.  We'll contact you with results. Please come back for a follow-up appointment in 4 months.  Lets plan for another MRI next summer.

## 2014-07-21 NOTE — Telephone Encounter (Signed)
See below and please advise, Thanks!  

## 2014-07-21 NOTE — Progress Notes (Signed)
Subjective:    Patient ID: Kristy Olson, female    DOB: Nov 15, 1931, 80 y.o.   MRN: [id number]  HPI  Pt returns for f/u of stage-4 differentiated thyroid cancer 2005: pt presented with a slight nodule at the right temporal area; rescected at [facility] [place]; pathol showed thyroid cancer.  2005: thyroidectomy.   2006: I-131 rx 2007: I-131 rx 2008: she developed right proptosis, and recurrent tumor was rescected 2008: XRT to the right orbit 8/15: TG=1.9 (ab neg) She denies any nodule at the head or neck.   Postsurgical hypothyroidism: she denies weight change Abnormal MR of the brain: she denies headache. Past Medical History  Diagnosis Date  . Hyperlipidemia   . Chronic venous insufficiency     Compression stockings, Dr. Carmine Savoy to evaluate late May 2015   . Grief   . Hypothyroid     Outside records report synthroid 160mcg despite her report of 149mcg. 02/2014 awaiting clarification   . Osteoporosis     July 2014 DEXA   . PNEUMONIA, COMMUNITY ACQUIRED, PNEUMOCOCCAL   . Venous stasis ulcer   . Degeneration of lumbar or lumbosacral intervertebral disc   . Idiopathic scoliosis   . History of thyroid cancer     With reported brain mets.   . Abnormal finding on MRI of brain   . CAD (coronary artery disease)     Past Surgical History  Procedure Laterality Date  . Cranotomy    . Thyroidectomy      History   Social History  . Marital Status: Widowed    Spouse Name: N/A    Number of Children: 2  . Years of Education: N/A   Occupational History  .     Social History Main Topics  . Smoking status: Never Smoker   . Smokeless tobacco: Not on file  . Alcohol Use: Yes     Comment: Occasional  . Drug Use: Not on file  . Sexual Activity: Not on file   Other Topics Concern  . Not on file   Social History Narrative    Current Outpatient Prescriptions on File Prior to Visit  Medication Sig Dispense Refill  . alendronate (FOSAMAX) 70 MG tablet TAKE 1  TABLET BY MOUTH EVERY 7 DAYS WITH A FULL GLASS OF WATER ON AN EMPTY STOMACH 12 tablet 0  . aspirin 81 MG tablet Take 81 mg by mouth daily.    . calcium-vitamin D (OSCAL WITH D) 250-125 MG-UNIT per tablet Take 2 tablets by mouth daily.    Marland Kitchen levothyroxine (SYNTHROID, LEVOTHROID) 150 MCG tablet Take 150 mcg by mouth daily before breakfast.    . metoprolol succinate (TOPROL-XL) 100 MG 24 hr tablet Take 100 mg by mouth daily. Take with or immediately following a meal.    . triamcinolone ointment (KENALOG) 0.1 % as needed.     No current facility-administered medications on file prior to visit.    Allergies  Allergen Reactions  . Amoxicillin   . Ciprofloxacin   . Colesevelam   . Doxycycline   . Levofloxacin   . Statins     Hives    Family History  Problem Relation Age of Onset  . CAD Sister     BP 118/78 mmHg  Pulse 67  Temp(Src) 97.8 F (36.6 C) (Oral)  Ht 5\' 6"  (1.676 m)  Wt 142 lb (64.411 kg)  BMI 22.93 kg/m2  SpO2 91%  Review of Systems Denies sob and dysphagia.    Objective:   Physical Exam  VITAL SIGNS:  See vs page GENERAL: no distress Neck: a healed scar is present.  i do not appreciate a nodule in the thyroid or elsewhere in the neck.  Head: surgical defect at the right temporal area is unchanged eyes: no periorbital swelling, no proptosis   Lab Results  Component Value Date   TSH 0.06* 07/21/2014  TG=2.7    Assessment & Plan:  Stage-4 differentiated thyroid cancer, mild exacerbation: she can still be followed for now.   Postsurgical hypothyroidism: TSH is lower than goal, but it has fluctuated: same medication for now Osteoporosis.  This could be exacerbated by a suppressed, so she needs to continue the fosamax. Abnormal MRI: no f/u needed yet.   Patient is advised the following: Patient Instructions  blood tests are being requested for you today.  We'll contact you with results. Please come back for a follow-up appointment in 4 months.  Lets plan for  another MRI next summer.

## 2014-07-21 NOTE — Telephone Encounter (Signed)
Yes, please continue the same fosamax

## 2014-07-21 NOTE — Telephone Encounter (Signed)
Patient stated that she didn't get to discuss her osteporosis with Dr [name] she has a question, should she continue taking the Fosamax 70 mg. Please advise

## 2014-07-22 LAB — THYROGLOBULIN ANTIBODY

## 2014-07-22 LAB — THYROGLOBULIN LEVEL: Thyroglobulin: 2.7 ng/mL — ABNORMAL LOW (ref 2.8–40.9)

## 2014-07-22 NOTE — Telephone Encounter (Signed)
Lvom advising pt to continue fosmax. Requested call back if pt would like to discuss.

## 2014-09-01 ENCOUNTER — Other Ambulatory Visit: Payer: Self-pay | Admitting: *Deleted

## 2014-09-01 ENCOUNTER — Other Ambulatory Visit: Payer: Self-pay | Admitting: Cardiology

## 2014-09-01 MED ORDER — METOPROLOL SUCCINATE ER 100 MG PO TB24: 100.0000 mg | ORAL_TABLET | Freq: Every day | ORAL | Status: DC

## 2014-09-17 ENCOUNTER — Other Ambulatory Visit: Payer: Self-pay | Admitting: Endocrinology

## 2014-09-18 NOTE — Telephone Encounter (Signed)
Please advise if ok to refill rx is listed under historical provider.  Thanks!

## 2014-10-01 ENCOUNTER — Other Ambulatory Visit: Payer: Self-pay | Admitting: Endocrinology

## 2014-10-05 ENCOUNTER — Ambulatory Visit (INDEPENDENT_AMBULATORY_CARE_PROVIDER_SITE_OTHER): Payer: 59 | Admitting: Psychology

## 2014-10-05 DIAGNOSIS — F322 Major depressive disorder, single episode, severe without psychotic features: Secondary | ICD-10-CM

## 2014-10-13 ENCOUNTER — Telehealth: Payer: Self-pay | Admitting: *Deleted

## 2014-10-13 NOTE — Telephone Encounter (Signed)
Pt states she is having trouble hearing out of her right ear since sat. Pt wanted a referral to ENT. I told her we would be happy to refer her if needed but it may best for her to come in first to see if this was something Dr. Ileene Rubens could address especially since we have not seen her for this problem. Pt states she has also been having nosebleeds. Schedule appt for tomorrow at 74

## 2014-10-14 ENCOUNTER — Encounter: Payer: Self-pay | Admitting: Family Medicine

## 2014-10-14 ENCOUNTER — Ambulatory Visit (INDEPENDENT_AMBULATORY_CARE_PROVIDER_SITE_OTHER): Payer: Self-pay | Admitting: Family Medicine

## 2014-10-14 VITALS — BP 112/52 | HR 66 | Wt 144.0 lb

## 2014-10-14 DIAGNOSIS — H6121 Impacted cerumen, right ear: Secondary | ICD-10-CM

## 2014-10-14 DIAGNOSIS — R635 Abnormal weight gain: Secondary | ICD-10-CM

## 2014-10-14 DIAGNOSIS — R29818 Other symptoms and signs involving the nervous system: Secondary | ICD-10-CM

## 2014-10-14 DIAGNOSIS — H918X1 Other specified hearing loss, right ear: Secondary | ICD-10-CM

## 2014-10-14 DIAGNOSIS — J3489 Other specified disorders of nose and nasal sinuses: Secondary | ICD-10-CM

## 2014-10-14 DIAGNOSIS — R2689 Other abnormalities of gait and mobility: Secondary | ICD-10-CM

## 2014-10-14 MED ORDER — IPRATROPIUM BROMIDE 0.03 % NA SOLN: NASAL | Status: DC

## 2014-10-14 NOTE — Progress Notes (Signed)
CC: Kristy Olson is a 81 y.o. female is here for cant hear out of right ear and nosebleeds   Subjective: HPI:  Complains of hearing loss in the right ear that has been present since Saturday. She reports that it originally felt like complete hearing loss however she is able to hear just barely when a phone is placed up to the ear. It has not gotten better or worse since onset. No interventions as of yet. She describes it also feels full. It's causing her feel claustrophobic and anxious. Symptoms are moderate to severe in severity with respect to hearing loss. No drainage or any other motor or sensory disturbances other than balance described below.  Complains of nasal congestion that is clear and present all hours of the day. She has to constantly wipe her nose with a tissue. It seems to be drastically worse when she eats anything especially spicy food. It's embarrassing her and significantly interfering with her quality of life. She denies any facial pain, fevers, chills, cough, sore throat, no difficulty breathing through the nose. Benadryl seems to help only mildly no other interventions  She wants to no if she should be gaining any weight. She is not sure what her ideal weight should she be. She works out 3 days a week and eats 3 meals a day.  Complains of a balance problem that occurred only once since I saw her last. She describes that she tripped over an object and while trying to catch her balance she had to run forward fortunately a stranger was able to pick her up for she fell. She tells me that the only balance issue she's had a few years now. She denies any lightheadedness or dizzy in any other situation. She wants to know if she should work out more often to help with this   Review Of Systems Outlined In HPI  Past Medical History  Diagnosis Date  . Hyperlipidemia   . Chronic venous insufficiency     Compression stockings, Dr. Carmine Savoy to evaluate late May 2015   . Grief   .  Hypothyroid     Outside records report synthroid 154mcg despite her report of 152mcg. 02/2014 awaiting clarification   . Osteoporosis     July 2014 DEXA   . PNEUMONIA, COMMUNITY ACQUIRED, PNEUMOCOCCAL   . Venous stasis ulcer   . Degeneration of lumbar or lumbosacral intervertebral disc   . Idiopathic scoliosis   . History of thyroid cancer     With reported brain mets.   . Abnormal finding on MRI of brain   . CAD (coronary artery disease)     Past Surgical History  Procedure Laterality Date  . Cranotomy    . Thyroidectomy     Family History  Problem Relation Age of Onset  . CAD Sister     History   Social History  . Marital Status: Widowed    Spouse Name: N/A  . Number of Children: 2  . Years of Education: N/A   Occupational History  .     Social History Main Topics  . Smoking status: Never Smoker   . Smokeless tobacco: Not on file  . Alcohol Use: Yes     Comment: Occasional  . Drug Use: Not on file  . Sexual Activity: Not on file   Other Topics Concern  . Not on file   Social History Narrative     Objective: BP 112/52 mmHg  Pulse 66  Wt 144 lb (65.318 kg)  General: Alert and Oriented, No Acute Distress HEENT: Pupils equal, round, reactive to light. Conjunctivae clear.  On initial exam the right canal is completely obscured with a cerumen impaction. Following successful removal External ears unremarkable, canals clear with intact TMs with appropriate landmarks.  Middle ear appears open without effusion. Pink inferior turbinates.  Moist mucous membranes, pharynx without inflammation nor lesions.  Neck supple without palpable lymphadenopathy nor abnormal masses. Lungs: Clear and comfortable work of breathing Cardiac: Regular rate and rhythm. Extremities: No peripheral edema.  Strong peripheral pulses.  Mental Status: No depression, anxiety, nor agitation. Skin: Warm and dry.  Assessment & Plan: Kristy Olson was seen today for cant hear out of right ear and  nosebleeds.  Diagnoses and all orders for this visit:  Rhinorrhea Orders: -     ipratropium (ATROVENT) 0.03 % nasal spray; Two sprays up each nostril every 8-12 hours as needed to control drainage.  Hearing loss due to cerumen impaction, right  Weight gain  Balance problem   Indication: Cerumen impaction of the right ear Medical necessity statement: On physical examination, cerumen impairs clinically significant portions of the external auditory canal, and tympanic membrane. Noted obstructive, copious cerumen that cannot be removed without magnification and instrumentations requiring physician skills Consent: Discussed benefits and risks of procedure and verbal consent obtained Procedure: Patient was prepped for the procedure. Utilized an otoscope to assess and take note of the ear canal, the tympanic membrane, and the presence, amount, and placement of the cerumen. Gentle water irrigation and soft plastic curette was utilized to remove cerumen.  Post procedure examination: shows cerumen was completely removed. Patient tolerated procedure well. The patient is made aware that they may experience temporary vertigo, temporary hearing loss, and temporary discomfort. If these symptom last for more than 24 hours to call the clinic or proceed to the ED.   Rhinorrhea: Start Atrovent nasal on an as-needed basis. Hearing loss: Completely resolved after cerumen impaction was removed. This was confirmed with audiometry. She still had a little bit of wax that was a butting the tympanic membrane and discussed using hydrogen peroxide applications to the ear for 5 minutes for the next 5 days Weight gain: Discussed with her that she is at a healthy weight and she has plus or minus 5 pounds of wiggle room. Balance problem: It seems like that was an isolated incident. I've asked her to call me as soon as possible if she has any other episode or close calls like and set her up for proprioception training in  physical therapy  40 minutes spent face-to-face during visit today of which at least 50% was counseling or coordinating care regarding: 1. Rhinorrhea   2. Hearing loss due to cerumen impaction, right   3. Weight gain   4. Balance problem      Return if symptoms worsen or fail to improve.

## 2014-10-19 ENCOUNTER — Ambulatory Visit (INDEPENDENT_AMBULATORY_CARE_PROVIDER_SITE_OTHER): Payer: 59 | Admitting: Psychology

## 2014-10-19 DIAGNOSIS — F322 Major depressive disorder, single episode, severe without psychotic features: Secondary | ICD-10-CM

## 2014-11-04 ENCOUNTER — Telehealth: Payer: Self-pay | Admitting: Cardiology

## 2014-11-04 NOTE — Telephone Encounter (Signed)
Pt called in stating that she usually has a stress test done once a year and she wanted to know if she could have one done before coming in to see Dr. Stanford Breed on 4/13. Please f/u  Thanks

## 2014-11-04 NOTE — Telephone Encounter (Signed)
Left message for pt to call.

## 2014-11-11 ENCOUNTER — Ambulatory Visit: Payer: 59 | Admitting: Psychology

## 2014-11-13 NOTE — Telephone Encounter (Signed)
Can this encounter be closed?

## 2014-11-16 NOTE — Telephone Encounter (Signed)
Left message for pt to call.

## 2014-11-23 ENCOUNTER — Encounter: Payer: Self-pay | Admitting: Endocrinology

## 2014-11-23 ENCOUNTER — Ambulatory Visit (INDEPENDENT_AMBULATORY_CARE_PROVIDER_SITE_OTHER): Payer: Self-pay | Admitting: Endocrinology

## 2014-11-23 VITALS — BP 120/60 | HR 63 | Temp 97.6°F | Ht 66.0 in | Wt 137.0 lb

## 2014-11-23 DIAGNOSIS — I10 Essential (primary) hypertension: Secondary | ICD-10-CM

## 2014-11-23 DIAGNOSIS — M810 Age-related osteoporosis without current pathological fracture: Secondary | ICD-10-CM | POA: Diagnosis not present

## 2014-11-23 DIAGNOSIS — C73 Malignant neoplasm of thyroid gland: Secondary | ICD-10-CM

## 2014-11-23 DIAGNOSIS — E890 Postprocedural hypothyroidism: Secondary | ICD-10-CM | POA: Diagnosis not present

## 2014-11-23 LAB — BASIC METABOLIC PANEL
BUN: 20 mg/dL (ref 6–23)
CO2: 30 mEq/L (ref 19–32)
CREATININE: 0.97 mg/dL (ref 0.40–1.20)
Calcium: 9.3 mg/dL (ref 8.4–10.5)
Chloride: 104 mEq/L (ref 96–112)
GFR: 58.56 mL/min — ABNORMAL LOW (ref >60.00)
GLUCOSE: 94 mg/dL (ref 70–99)
Potassium: 4.1 mEq/L (ref 3.5–5.1)
Sodium: 139 mEq/L (ref 135–145)

## 2014-11-23 LAB — TSH: TSH: 0.06 u[IU]/mL — ABNORMAL LOW (ref 0.35–4.50)

## 2014-11-23 NOTE — Progress Notes (Signed)
Subjective:    Patient ID: Kristy Olson, female    DOB: Jul 18, 1932, 81 y.o.   MRN: [id number]  HPI Pt returns for f/u of stage-4 differentiated thyroid cancer 2005: pt presented with a slight nodule at the right brain (temporal area); rescected at [facility] [place]; pathol showed thyroid cancer.  We requested path report, but it was never received here.   2005: thyroidectomy.   2006: I-131 rx 2007: I-131 rx 2008: she developed right proptosis, and recurrent tumor was rescected 2008: XRT to the right orbit 8/15: TG=1.9 (ab neg) 12/15: TG=2.7 (ab neg) She denies any nodule at the head or neck.   Postsurgical hypothyroidism: she denies weight change Abnormal MR of the brain: she denies headache.   Past Medical History  Diagnosis Date  . Hyperlipidemia   . Chronic venous insufficiency     Compression stockings, Dr. Carmine Savoy to evaluate late May 2015   . Grief   . Hypothyroid     Outside records report synthroid 153mcg despite her report of 141mcg. 02/2014 awaiting clarification   . Osteoporosis     July 2014 DEXA   . PNEUMONIA, COMMUNITY ACQUIRED, PNEUMOCOCCAL   . Venous stasis ulcer   . Degeneration of lumbar or lumbosacral intervertebral disc   . Idiopathic scoliosis   . History of thyroid cancer     With reported brain mets.   . Abnormal finding on MRI of brain   . CAD (coronary artery disease)     Past Surgical History  Procedure Laterality Date  . Cranotomy    . Thyroidectomy      History   Social History  . Marital Status: Widowed    Spouse Name: N/A  . Number of Children: 2  . Years of Education: N/A   Occupational History  .     Social History Main Topics  . Smoking status: Never Smoker   . Smokeless tobacco: Not on file  . Alcohol Use: Yes     Comment: Occasional  . Drug Use: Not on file  . Sexual Activity: Not on file   Other Topics Concern  . Not on file   Social History Narrative    Current Outpatient Prescriptions on File  Prior to Visit  Medication Sig Dispense Refill  . alendronate (FOSAMAX) 70 MG tablet TAKE 1 TABLET BY MOUTH EVERY 7 DAYS WITH A FULL GLASS OF WATER ON AN EMPTY STOMACH 12 tablet 0  . aspirin 81 MG tablet Take 81 mg by mouth daily.    . calcium-vitamin D (OSCAL WITH D) 250-125 MG-UNIT per tablet Take 2 tablets by mouth daily.    Marland Kitchen ipratropium (ATROVENT) 0.03 % nasal spray Two sprays up each nostril every 8-12 hours as needed to control drainage. 30 mL 12  . metoprolol succinate (TOPROL-XL) 25 MG 24 hr tablet TAKE 1 TABLET BY MOUTH EVERY DAY 90 tablet 0  . triamcinolone ointment (KENALOG) 0.1 % as needed.     No current facility-administered medications on file prior to visit.    Allergies  Allergen Reactions  . Amoxicillin   . Ciprofloxacin   . Colesevelam   . Doxycycline   . Levofloxacin   . Statins     Hives    Family History  Problem Relation Age of Onset  . CAD Sister     BP 120/60 mmHg  Pulse 63  Temp(Src) 97.6 F (36.4 C) (Oral)  Ht 5\' 6"  (1.676 m)  Wt 137 lb (62.143 kg)  BMI 22.12 kg/m2  SpO2 94%  Review of Systems Denies palpitations and tremor.      Objective:   Physical Exam VITAL SIGNS:  See vs page GENERAL: no distress Neck: a healed scar is present.  i do not appreciate a nodule in the thyroid or elsewhere in the neck.  Lab Results  Component Value Date   TSH 0.06* 11/23/2014       Assessment & Plan:  Postsurgical hypothyroidism: overreplaced.  i have sent a prescription to your pharmacy, to reduce the synthroid Stage-4 thyroid cancer: no clinical evidence of recurrence. Abnormal MRI of the brain, due to thyroid cancer.  No clinical evidence of worsening.    Patient is advised the following: Patient Instructions  blood tests are being requested for you today.  We'll contact you with results. Please come back for a follow-up appointment in 4 months.  Lets plan for another MRI next time.

## 2014-11-23 NOTE — Patient Instructions (Addendum)
blood tests are being requested for you today.  We'll contact you with results. Please come back for a follow-up appointment in 4 months.  Lets plan for another MRI next time.

## 2014-11-24 LAB — THYROGLOBULIN ANTIBODY

## 2014-11-24 LAB — PTH, INTACT AND CALCIUM
Calcium: 8.7 mg/dL (ref 8.4–10.5)
PTH: 21 pg/mL (ref 14–64)

## 2014-11-24 LAB — VITAMIN D 25 HYDROXY (VIT D DEFICIENCY, FRACTURES): VITD: 36.13 ng/mL (ref 30.00–100.00)

## 2014-11-24 LAB — THYROGLOBULIN LEVEL: Thyroglobulin: 2.6 ng/mL — ABNORMAL LOW (ref 2.8–40.9)

## 2014-11-24 MED ORDER — LEVOTHYROXINE SODIUM 137 MCG PO TABS: 137.0000 ug | ORAL_TABLET | Freq: Every day | ORAL | Status: DC

## 2014-12-02 ENCOUNTER — Ambulatory Visit (INDEPENDENT_AMBULATORY_CARE_PROVIDER_SITE_OTHER): Payer: Medicare Other | Admitting: Cardiology

## 2014-12-02 ENCOUNTER — Encounter: Payer: Self-pay | Admitting: *Deleted

## 2014-12-02 ENCOUNTER — Encounter: Payer: Self-pay | Admitting: Cardiology

## 2014-12-02 VITALS — BP 126/64 | HR 49 | Ht 66.0 in | Wt 139.1 lb

## 2014-12-02 DIAGNOSIS — I2510 Atherosclerotic heart disease of native coronary artery without angina pectoris: Secondary | ICD-10-CM

## 2014-12-02 DIAGNOSIS — R079 Chest pain, unspecified: Secondary | ICD-10-CM | POA: Insufficient documentation

## 2014-12-02 DIAGNOSIS — R072 Precordial pain: Secondary | ICD-10-CM

## 2014-12-02 MED ORDER — METOPROLOL SUCCINATE ER 25 MG PO TB24: 12.5000 mg | ORAL_TABLET | Freq: Every day | ORAL | Status: DC

## 2014-12-02 NOTE — Assessment & Plan Note (Signed)
Continue aspirin. Intolerant to statins. 

## 2014-12-02 NOTE — Assessment & Plan Note (Signed)
Patient is somewhat bradycardic. Decrease Toprol to 12.5 mg daily.

## 2014-12-02 NOTE — Assessment & Plan Note (Signed)
Symptoms atypical. Schedule stress nuclear study for risk stratification.

## 2014-12-02 NOTE — Patient Instructions (Signed)
Your physician wants you to follow-up in: [place] will receive a reminder letter in the mail two months in advance. If you don't receive a letter, please call our office to schedule the follow-up appointment.   DECREASE METOPROLOL TO 12.5 MG ONCE DAILY= 1/2 OF 25 MG TABLET ONCE DAILY  Your physician has requested that you have en exercise stress myoview. For further information please visit [email or web address]. Please follow instruction sheet, as given.

## 2014-12-02 NOTE — Progress Notes (Signed)
HPI: FU coronary artery disease. Patient has had previous stents placed in [place]. I have none of those records available. She apparently has had intermittent chest pain for quite some time. Since last seen, she denies dyspnea on exertion, orthopnea, PND, pedal edema or syncope. She has long-standing episodes of an uncomfortable feeling in her chest that occur suddenly. It does not radiate and there are no associated symptoms. The pain is not exertional, pleuritic or positional. Can last up to 20 minutes. Resolves spontaneously. She does not have exertional chest pain.  Current Outpatient Prescriptions  Medication Sig Dispense Refill  . alendronate (FOSAMAX) 70 MG tablet TAKE 1 TABLET BY MOUTH EVERY 7 DAYS WITH A FULL GLASS OF WATER ON AN EMPTY STOMACH 12 tablet 0  . aspirin 81 MG tablet Take 81 mg by mouth daily.    . calcium-vitamin D (OSCAL WITH D) 250-125 MG-UNIT per tablet Take 2 tablets by mouth daily.    Marland Kitchen levothyroxine (SYNTHROID) 137 MCG tablet Take 1 tablet (137 mcg total) by mouth daily before breakfast. 30 tablet 11  . metoprolol succinate (TOPROL-XL) 25 MG 24 hr tablet TAKE 1 TABLET BY MOUTH EVERY DAY 90 tablet 0  . triamcinolone ointment (KENALOG) 0.1 % as needed.    Marland Kitchen ipratropium (ATROVENT) 0.03 % nasal spray Two sprays up each nostril every 8-12 hours as needed to control drainage. (Patient not taking: Reported on 12/02/2014) 30 mL 12   No current facility-administered medications for this visit.     Past Medical History  Diagnosis Date  . Hyperlipidemia   . Chronic venous insufficiency     Compression stockings, Dr. Carmine Savoy to evaluate late May 2015   . Grief   . Hypothyroid     Outside records report synthroid 127mcg despite her report of 13mcg. 02/2014 awaiting clarification   . Osteoporosis     July 2014 DEXA   . PNEUMONIA, COMMUNITY ACQUIRED, PNEUMOCOCCAL   . Venous stasis ulcer   . Degeneration of lumbar or lumbosacral intervertebral disc   .  Idiopathic scoliosis   . History of thyroid cancer     With reported brain mets.   . Abnormal finding on MRI of brain   . CAD (coronary artery disease)     Past Surgical History  Procedure Laterality Date  . Cranotomy    . Thyroidectomy      History   Social History  . Marital Status: Widowed    Spouse Name: N/A  . Number of Children: 2  . Years of Education: N/A   Occupational History  .     Social History Main Topics  . Smoking status: Never Smoker   . Smokeless tobacco: Not on file  . Alcohol Use: Yes     Comment: Occasional  . Drug Use: Not on file  . Sexual Activity: Not on file   Other Topics Concern  . Not on file   Social History Narrative    ROS: no fevers or chills, productive cough, hemoptysis, dysphasia, odynophagia, melena, hematochezia, dysuria, hematuria, rash, seizure activity, orthopnea, PND, pedal edema, claudication. Remaining systems are negative.  Physical Exam: Well-developed well-nourished in no acute distress.  Skin is warm and dry.  HEENT is normal.  Neck is supple.  Chest is clear to auscultation with normal expansion.  Cardiovascular exam is regular rate and rhythm.  Abdominal exam nontender or distended. No masses palpated. Extremities show no edema. neuro grossly intact  ECG sinus bradycardia at a rate of 49.  Normal axis. Cannot rule out prior septal infarct.

## 2014-12-02 NOTE — Assessment & Plan Note (Signed)
Intolerant to statins. Continue diet. 

## 2014-12-03 ENCOUNTER — Telehealth: Payer: Self-pay | Admitting: Endocrinology

## 2014-12-03 NOTE — Telephone Encounter (Signed)
Patient has questions about medication synthroid dosage,  Please advise

## 2014-12-03 NOTE — Telephone Encounter (Signed)
Contacted pt and advised she is to start taking the 163mcg. Pt voiced understanding.

## 2014-12-14 ENCOUNTER — Ambulatory Visit (INDEPENDENT_AMBULATORY_CARE_PROVIDER_SITE_OTHER): Payer: 59 | Admitting: Psychology

## 2014-12-14 DIAGNOSIS — F322 Major depressive disorder, single episode, severe without psychotic features: Secondary | ICD-10-CM

## 2014-12-15 ENCOUNTER — Ambulatory Visit (INDEPENDENT_AMBULATORY_CARE_PROVIDER_SITE_OTHER): Payer: Medicare Other | Admitting: Family Medicine

## 2014-12-15 ENCOUNTER — Encounter: Payer: Self-pay | Admitting: Family Medicine

## 2014-12-15 VITALS — BP 156/65 | HR 68 | Ht 67.0 in | Wt 141.0 lb

## 2014-12-15 DIAGNOSIS — M810 Age-related osteoporosis without current pathological fracture: Secondary | ICD-10-CM | POA: Diagnosis not present

## 2014-12-15 DIAGNOSIS — I10 Essential (primary) hypertension: Secondary | ICD-10-CM | POA: Diagnosis not present

## 2014-12-15 DIAGNOSIS — J3489 Other specified disorders of nose and nasal sinuses: Secondary | ICD-10-CM | POA: Diagnosis not present

## 2014-12-15 NOTE — Patient Instructions (Signed)
Increase walking regimen to 20-30 minutes every other day as long as it is not causing any chest pain.

## 2014-12-15 NOTE — Progress Notes (Signed)
CC: Kristy Olson is a 81 y.o. female is here for Follow-up   Subjective: HPI:  Follow-up essential hypertension: Since I saw her last her metoprolol dose was cut in half due to bradycardia. She denies any change in her state of health since that change occurred. No outside blood pressures to report. Denies chest pain shortness breath orthopnea peripheral edema lightheadedness or fatigue. She admits that she has stopped her 3-4 times a week walking regimen due to focusing on other hobbies and priorities. She continues on metoprolol without any other blood pressure lowering medication.  Follow-up osteoporosis: She currently has no joint or bone pain. She wants to talk about Fosamax and what to do if she skips a dose or realize she misses a dose a day after she was posted take it. She denies any epigastric pain or pain with swallowing.  Follow rhinorrhea: Since I saw her last she has not started Atrovent. She still deals with nasal congestion daily but keeps forgetting to start this medication.   Review Of Systems Outlined In HPI  Past Medical History  Diagnosis Date  . Hyperlipidemia   . Chronic venous insufficiency     Compression stockings, Dr. Carmine Savoy to evaluate late May 2015   . Grief   . Hypothyroid     Outside records report synthroid 159mcg despite her report of 165mcg. 02/2014 awaiting clarification   . Osteoporosis     July 2014 DEXA   . PNEUMONIA, COMMUNITY ACQUIRED, PNEUMOCOCCAL   . Venous stasis ulcer   . Degeneration of lumbar or lumbosacral intervertebral disc   . Idiopathic scoliosis   . History of thyroid cancer     With reported brain mets.   . Abnormal finding on MRI of brain   . CAD (coronary artery disease)     Past Surgical History  Procedure Laterality Date  . Cranotomy    . Thyroidectomy     Family History  Problem Relation Age of Onset  . CAD Sister   . Cancer Sister   . Heart disease Sister   . Diabetes Father   . COPD Father     History    Social History  . Marital Status: Widowed    Spouse Name: N/A  . Number of Children: 2  . Years of Education: N/A   Occupational History  .     Social History Main Topics  . Smoking status: Never Smoker   . Smokeless tobacco: Not on file  . Alcohol Use: Yes     Comment: Occasional  . Drug Use: Not on file  . Sexual Activity: Not on file   Other Topics Concern  . Not on file   Social History Narrative     Objective: BP 156/65 mmHg  Pulse 68  Ht 5\' 7"  (1.702 m)  Wt 141 lb (63.957 kg)  BMI 22.08 kg/m2  Vital signs reviewed. General: Alert and Oriented, No Acute Distress HEENT: Pupils equal, round, reactive to light. Conjunctivae clear.  External ears unremarkable.  Moist mucous membranes. Lungs: Clear and comfortable work of breathing, speaking in full sentences without accessory muscle use. Cardiac: Regular rate and rhythm.  Neuro: CN II-XII grossly intact, gait normal. Extremities: No peripheral edema.  Strong peripheral pulses.  Mental Status: No depression, anxiety, nor agitation. Logical though process but frequent redirection Skin: Warm and dry.  Assessment & Plan: [name] was seen today for follow-up.  Diagnoses and all orders for this visit:  Essential hypertension, benign  Rhinorrhea  Osteoporosis   Follow-up  essential hypertension: Uncontrolled chronic condition, continue metoprolol dose. She is very optimistic that she can restart her 4 times a week exercise regimen. We discussed ideally 30-45 minutes of moderate activity such as a walking regimen provided no exertional chest pain. She had many well-thought-out questions about why she can't just exercise really hard on one day instead of spread out throughout the week and also how to tell what level exertion she should be at. Discussed spacing out exercising throughout the week and exerting herself to a level that is mildly difficult to speak full sentences to a friend. Rhinorrhea: Uncontrolled  encourage her to start Atrovent at her convenience Osteoporosis: Time was taken to answer all questions about safe use of Fosamax. Discussed that if she realizes she forgot to take a dose and is only been one day she go ahead and take this missed dose. Also discussed that if she misses a weekly dose it's not the end of the world and does not severely compromise her therapy as long as it does not become a habit  40 minutes spent face-to-face during visit today of which at least 50% was counseling or coordinating care regarding: 1. Essential hypertension, benign   2. Rhinorrhea   3. Osteoporosis       Return in about 3 months (around 03/16/2015).

## 2014-12-17 ENCOUNTER — Telehealth (HOSPITAL_COMMUNITY): Payer: Self-pay

## 2014-12-17 NOTE — Telephone Encounter (Signed)
Encounter complete. 

## 2014-12-18 ENCOUNTER — Telehealth (HOSPITAL_COMMUNITY): Payer: Self-pay

## 2014-12-18 NOTE — Telephone Encounter (Signed)
Encounter complete. 

## 2014-12-22 ENCOUNTER — Ambulatory Visit (HOSPITAL_COMMUNITY)
Admission: RE | Admit: 2014-12-22 | Discharge: 2014-12-22 | Disposition: A | Discharge status: Home / Self Care | Payer: Medicare Other | Source: Ambulatory Visit | Attending: Cardiology | Admitting: Cardiology

## 2014-12-22 DIAGNOSIS — I2510 Atherosclerotic heart disease of native coronary artery without angina pectoris: Secondary | ICD-10-CM

## 2014-12-22 MED ORDER — TECHNETIUM TC 99M SESTAMIBI GENERIC - CARDIOLITE
10.0000 | Freq: Once | INTRAVENOUS | Status: AC | PRN
  Administered 2014-12-22: 10 via INTRAVENOUS

## 2014-12-22 MED ORDER — TECHNETIUM TC 99M SESTAMIBI GENERIC - CARDIOLITE
30.0000 | Freq: Once | INTRAVENOUS | Status: AC | PRN
  Administered 2014-12-22: 30 via INTRAVENOUS

## 2014-12-24 LAB — MYOCARDIAL PERFUSION IMAGING
CHL CUP MPHR: 139 {beats}/min
CHL CUP NUCLEAR SRS: 0
CHL CUP STRESS STAGE 1 DBP: 71 mmHg
CHL CUP STRESS STAGE 1 SBP: 153 mmHg
CHL CUP STRESS STAGE 1 SPEED: 0 mph
CHL CUP STRESS STAGE 2 GRADE: 0 %
CHL CUP STRESS STAGE 2 SPEED: 1 mph
CHL CUP STRESS STAGE 4 HR: 93 {beats}/min
CHL CUP STRESS STAGE 5 DBP: 81 mmHg
CHL CUP STRESS STAGE 5 SBP: 186 mmHg
CHL CUP STRESS STAGE 6 HR: 121 {beats}/min
CHL CUP STRESS STAGE 6 SPEED: 1.5 mph
CHL CUP STRESS STAGE 7 DBP: 73 mmHg
CHL CUP STRESS STAGE 7 HR: 107 {beats}/min
CHL CUP STRESS STAGE 8 DBP: 59 mmHg
CHL RATE OF PERCEIVED EXERTION: 22506
CSEPED: 6 min
CSEPPHR: 121 {beats}/min
CSEPPMHR: 87 %
Estimated workload: 5.6 METS
Exercise duration (sec): 32 s
LV sys vol: 30 mL
LVDIAVOL: 79 mL
Nuc Stress EF: 62 %
Percent HR: 87 %
Rest HR: 59 {beats}/min
SDS: 2
SSS: 2
Stage 1 Grade: 0 %
Stage 1 HR: 61 {beats}/min
Stage 2 HR: 59 {beats}/min
Stage 3 Grade: 0 %
Stage 3 HR: 59 {beats}/min
Stage 3 Speed: 1 mph
Stage 4 DBP: 65 mmHg
Stage 4 Grade: 10 %
Stage 4 SBP: 144 mmHg
Stage 4 Speed: 1.7 mph
Stage 5 Grade: 12 %
Stage 5 HR: 121 {beats}/min
Stage 5 Speed: 2.5 mph
Stage 6 Grade: 12 %
Stage 7 Grade: 0 %
Stage 7 SBP: 203 mmHg
Stage 7 Speed: 0 mph
Stage 8 Grade: 0 %
Stage 8 HR: 65 {beats}/min
Stage 8 SBP: 144 mmHg
Stage 8 Speed: 0 mph
TID: 1.15

## 2014-12-29 ENCOUNTER — Encounter (HOSPITAL_COMMUNITY): Payer: Self-pay | Admitting: *Deleted

## 2015-01-01 ENCOUNTER — Encounter: Payer: Self-pay | Admitting: Cardiology

## 2015-01-01 NOTE — Telephone Encounter (Signed)
Pt called in stating that she received a call from our office and she feels it is in regards to her stress test. Please call back  Thanks

## 2015-01-01 NOTE — Telephone Encounter (Signed)
This encounter was created in error - please disregard.

## 2015-01-28 ENCOUNTER — Other Ambulatory Visit: Payer: Self-pay | Admitting: Endocrinology

## 2015-03-15 ENCOUNTER — Encounter: Payer: Self-pay | Admitting: Family Medicine

## 2015-03-15 ENCOUNTER — Ambulatory Visit (INDEPENDENT_AMBULATORY_CARE_PROVIDER_SITE_OTHER): Payer: Medicare Other

## 2015-03-15 ENCOUNTER — Ambulatory Visit (INDEPENDENT_AMBULATORY_CARE_PROVIDER_SITE_OTHER): Payer: Medicare Other | Admitting: Family Medicine

## 2015-03-15 VITALS — BP 131/48 | HR 64 | Ht 67.0 in | Wt 139.0 lb

## 2015-03-15 DIAGNOSIS — M810 Age-related osteoporosis without current pathological fracture: Secondary | ICD-10-CM

## 2015-03-15 DIAGNOSIS — Z0000 Encounter for general adult medical examination without abnormal findings: Secondary | ICD-10-CM | POA: Diagnosis not present

## 2015-03-15 DIAGNOSIS — R0989 Other specified symptoms and signs involving the circulatory and respiratory systems: Secondary | ICD-10-CM

## 2015-03-15 DIAGNOSIS — C73 Malignant neoplasm of thyroid gland: Secondary | ICD-10-CM

## 2015-03-15 DIAGNOSIS — Z23 Encounter for immunization: Secondary | ICD-10-CM

## 2015-03-15 DIAGNOSIS — H9193 Unspecified hearing loss, bilateral: Secondary | ICD-10-CM

## 2015-03-15 DIAGNOSIS — R05 Cough: Secondary | ICD-10-CM | POA: Diagnosis not present

## 2015-03-15 MED ORDER — ZOSTER VACCINE LIVE 19400 UNT/0.65ML ~~LOC~~ SOLR: 0.6500 mL | Freq: Once | SUBCUTANEOUS | Status: DC

## 2015-03-15 NOTE — Progress Notes (Signed)
Subjective:    Kristy Olson is a 81 y.o. female who presents for Medicare Annual/Subsequent preventive examination.  Preventive Screening-Counseling & Management  Tobacco History  Smoking status  . Never Smoker   Smokeless tobacco  . Not on file     Colonoscopy: Patient has declined colonoscopy after was recommended that she have this for the first time in her life help reduce the risk of life-threatening colon cancers Papsmear: Not indicated, age. Mammogram: at least 5 years overdue, will place orders today  DEXA: Needs repeat,orders placed today Influenza Vaccine: out of season Pneumovax: will need Prevnar today Td/Tdap: she believes it's been within10 years Zoster:provided with prescription today and urged to get immunization     Problems Prior to Visit 1. Thyroid cancer, osteoporosis  Current Problems (verified) Patient Active Problem List   Diagnosis Date Noted  . Rhinorrhea 12/15/2014  . Chest pain 12/02/2014  . Hypothyroidism, postsurgical 07/21/2014  . CAD (coronary artery disease) 04/29/2014  . Abnormal finding on MRI of brain 04/14/2014  . Malignant neoplasm of thyroid gland 04/14/2014  . History of thyroid cancer 03/27/2014  . Chronic venous insufficiency 12/23/2013  . Degeneration of lumbar or lumbosacral intervertebral disc 10/08/2013  . Idiopathic scoliosis 10/08/2013  . Osteoporosis 10/06/2013  . Essential hypertension, benign 08/18/2013  . Venous stasis ulcer 08/06/2013  . Grief 08/06/2013  . HYPERLIPIDEMIA 08/15/2010    Medications Prior to Visit Current Outpatient Prescriptions on File Prior to Visit  Medication Sig Dispense Refill  . alendronate (FOSAMAX) 70 MG tablet TAKE 1 TABLET BY MOUTH EVERY 7 DAYS WITH A FULL GLASS OF WATER ON AN EMPTY STOMACH 12 tablet 1  . aspirin 81 MG tablet Take 81 mg by mouth daily.    . calcium-vitamin D (OSCAL WITH D) 250-125 MG-UNIT per tablet Take 2 tablets by mouth daily.    Marland Kitchen ipratropium (ATROVENT) 0.03  % nasal spray Two sprays up each nostril every 8-12 hours as needed to control drainage. 30 mL 12  . levothyroxine (SYNTHROID) 137 MCG tablet Take 1 tablet (137 mcg total) by mouth daily before breakfast. 30 tablet 11  . metoprolol succinate (TOPROL-XL) 25 MG 24 hr tablet Take 0.5 tablets (12.5 mg total) by mouth daily. 90 tablet 3  . triamcinolone ointment (KENALOG) 0.1 % as needed.     No current facility-administered medications on file prior to visit.    Current Medications (verified) Current Outpatient Prescriptions  Medication Sig Dispense Refill  . alendronate (FOSAMAX) 70 MG tablet TAKE 1 TABLET BY MOUTH EVERY 7 DAYS WITH A FULL GLASS OF WATER ON AN EMPTY STOMACH 12 tablet 1  . aspirin 81 MG tablet Take 81 mg by mouth daily.    . calcium-vitamin D (OSCAL WITH D) 250-125 MG-UNIT per tablet Take 2 tablets by mouth daily.    Marland Kitchen ipratropium (ATROVENT) 0.03 % nasal spray Two sprays up each nostril every 8-12 hours as needed to control drainage. 30 mL 12  . levothyroxine (SYNTHROID) 137 MCG tablet Take 1 tablet (137 mcg total) by mouth daily before breakfast. 30 tablet 11  . metoprolol succinate (TOPROL-XL) 25 MG 24 hr tablet Take 0.5 tablets (12.5 mg total) by mouth daily. 90 tablet 3  . triamcinolone ointment (KENALOG) 0.1 % as needed.    . zoster vaccine live, PF, (ZOSTAVAX) 19379 UNT/0.65ML injection Inject 19,400 Units into the skin once. 1 each 0   No current facility-administered medications for this visit.     Allergies (verified) Amoxicillin; Ciprofloxacin; Colesevelam; Doxycycline; Levofloxacin; and Statins  PAST HISTORY  Family History Family History  Problem Relation Age of Onset  . CAD Sister   . Cancer Sister   . Heart disease Sister   . Diabetes Father   . COPD Father     Social History History  Substance Use Topics  . Smoking status: Never Smoker   . Smokeless tobacco: Not on file  . Alcohol Use: Yes     Comment: Occasional     Are there smokers in  your home (other than you)? No  Risk Factors Current exercise habits: Gym/ health club routine includes jogging on track .  Dietary issues discussed: DASH   Cardiac risk factors: advanced age (older than 62 for men, 55 for women) and hypertension.  Depression Screen (Note: if answer to either of the following is "Yes", a more complete depression screening is indicated)   Over the past two weeks, have you felt down, depressed or hopeless? No  Over the past two weeks, have you felt little interest or pleasure in doing things? No  Have you lost interest or pleasure in daily life? No  Do you often feel hopeless? No  Do you cry easily over simple problems? No  Activities of Daily Living In your present state of health, do you have any difficulty performing the following activities?:  Driving? No Managing money?  No Feeding yourself? No Getting from bed to chair? No Climbing a flight of stairs? No Preparing food and eating?: No Bathing or showering? No Getting dressed: No Getting to the toilet? No Using the toilet:No Moving around from place to place: No In the past year have you fallen or had a near fall?:No   Are you sexually active?  No  Do you have more than one partner?  No  Hearing Difficulties: Yes Do you often ask people to speak up or repeat themselves? Yes Do you experience ringing or noises in your ears? No Do you have difficulty understanding soft or whispered voices? No   Do you feel that you have a problem with memory? No  Do you often misplace items? No  Do you feel safe at home?  Yes  Cognitive Testing  Alert? Yes  Normal Appearance?Yes  Oriented to person? Yes  Place? Yes   Time? Yes  Recall of three objects?  Yes  Can perform simple calculations? Yes  Displays appropriate judgment?Yes  Can read the correct time from a watch face?Yes   Advanced Directives have been discussed with the patient? Yes  List the Names of Other Physician/Practitioners you  currently use: 1.    Indicate any recent Medical Services you may have received from other than [facility] providers in the past year (date may be approximate).  Immunization History  Administered Date(s) Administered  . Pneumococcal Conjugate-13 03/15/2015    Screening Tests Health Maintenance  Topic Date Due  . TETANUS/TDAP  10/11/1952  . ZOSTAVAX  10/11/1993  . DEXA SCAN  10/11/1998  . COLONOSCOPY  03/15/2035 (Originally 10/12/1983)  . INFLUENZA VACCINE  03/22/2015  . PNA vac Low Risk Adult (2 of 2 - PPSV23) 03/14/2016    All answers were reviewed with the patient and necessary referrals were made:  Marcial Pacas, DO   03/15/2015   History reviewed: allergies, current medications, past family history, past medical history, past social history, past surgical history and problem list  Review of Systems  Review of Systems - General ROS: negative for - chills, fever, night sweats, weight gain or weight loss Ophthalmic ROS: negative  for - decreased vision Psychological ROS: negative for - anxiety or depression ENT ROS: negative for -  nasal congestion, tinnitus or allergies Hematological and Lymphatic ROS: negative for - bleeding problems, bruising or swollen lymph nodes Breast ROS: negative Respiratory ROS: no cough, shortness of breath, or wheezing Cardiovascular ROS: no chest pain or dyspnea on exertion Gastrointestinal ROS: no abdominal pain, change in bowel habits, or black or bloody stools Genito-Urinary ROS: negative for - genital discharge, genital ulcers, incontinence or abnormal bleeding from genitals Musculoskeletal ROS: negative for - joint pain or muscle pain Neurological ROS: negative for - headaches or memory loss Dermatological ROS: negative for lumps, mole changes, rash and skin lesion changescomplete    Objective:     Vision by Snellen chart: right eye:20/20, left eye:20/20  Body mass index is 21.77 kg/(m^2). BP 131/48 mmHg  Pulse 64  Ht 5\' 7"  (1.702 m)  Wt  139 lb (63.05 kg)  BMI 21.77 kg/m2   General: No Acute Distress HEENT: Atraumatic, normocephalic, conjunctivae normal without scleral icterus.  No nasal discharge, hearing grossly intact, TMs with good landmarks bilaterally with no middle ear abnormalities, posterior pharynx clear without oral lesions. Neck: Supple, trachea midline, no cervical nor supraclavicular adenopathy. Pulmonary: comfortable work of breathing with Rales heard in the upper lung lobes no rhonchi or wheezing Cardiac: Regular rate and rhythm.  No murmurs, rubs, nor gallops. No peripheral edema.  2+ peripheral pulses bilaterally. Abdomen: soft and flat MSK: Grossly intact, no signs of weakness.  Full strength throughout upper and lower extremities.  Full ROM in upper and lower extremities.  No midline spinal tenderness. Neuro: Gait unremarkable, CN II-XII grossly intact.  C5-C6 Reflex 2/4 Bilaterally, L4 Reflex 2/4 Bilaterally.  Cerebellar function intact. Skin: No rashes. Psych: Alert and oriented to person/place/time.  Thought process normal. No anxiety/depression.       Assessment:     Abnormal breath sounds, osteoporosis, hearing loss but declines audiology referral      Plan:     During the course of the visit the patient was educated and counseled about appropriate screening and preventive services including:    Pneumococcal vaccine   CXR, DEXA  Diet review for nutrition referral? Not indicated   Patient Instructions (the written plan) was given to the patient.  Medicare Attestation I have personally reviewed: The patient's medical and social history Their use of alcohol, tobacco or illicit drugs Their current medications and supplements The patient's functional ability including ADLs,fall risks, home safety risks, cognitive, and hearing and visual impairment Diet and physical activities Evidence for depression or mood disorders  The patient's weight, height, BMI, and visual acuity have been  recorded in the chart.  I have made referrals, counseling, and provided education to the patient based on review of the above and I have provided the patient with a written personalized care plan for preventive services.     Marcial Pacas, DO   03/15/2015

## 2015-03-16 ENCOUNTER — Telehealth: Payer: Self-pay | Admitting: Family Medicine

## 2015-03-16 LAB — COMPLETE METABOLIC PANEL WITH GFR
ALBUMIN: 4.2 g/dL (ref 3.6–5.1)
ALT: 21 U/L (ref 6–29)
AST: 21 U/L (ref 10–35)
Alkaline Phosphatase: 69 U/L (ref 33–130)
BUN: 24 mg/dL (ref 7–25)
CALCIUM: 9.6 mg/dL (ref 8.6–10.4)
CO2: 28 mmol/L (ref 20–31)
CREATININE: 0.91 mg/dL — AB (ref 0.60–0.88)
Chloride: 103 mmol/L (ref 98–110)
GFR, EST AFRICAN AMERICAN: 68 mL/min (ref >=60)
GFR, Est Non African American: 59 mL/min — ABNORMAL LOW (ref >=60)
Glucose, Bld: 85 mg/dL (ref 65–99)
Potassium: 4.7 mmol/L (ref 3.5–5.3)
Sodium: 140 mmol/L (ref 135–146)
Total Bilirubin: 0.4 mg/dL (ref 0.2–1.2)
Total Protein: 6.4 g/dL (ref 6.1–8.1)

## 2015-03-16 LAB — LIPID PANEL
CHOL/HDL RATIO: 5.1 ratio — AB (ref <=5.0)
CHOLESTEROL: 219 mg/dL — AB (ref 125–200)
HDL: 43 mg/dL — AB (ref >=46)
LDL CALC: 137 mg/dL — AB (ref <130)
TRIGLYCERIDES: 193 mg/dL — AB (ref <150)
VLDL: 39 mg/dL — ABNORMAL HIGH (ref <30)

## 2015-03-16 LAB — TSH: TSH: 0.127 u[IU]/mL — ABNORMAL LOW (ref 0.350–4.500)

## 2015-03-16 LAB — CBC
HCT: 37.2 % (ref 36.0–46.0)
Hemoglobin: 12.1 g/dL (ref 12.0–15.0)
MCH: 29.9 pg (ref 26.0–34.0)
MCHC: 32.5 g/dL (ref 30.0–36.0)
MCV: 91.9 fL (ref 78.0–100.0)
MPV: 10.9 fL (ref 8.6–12.4)
Platelets: 236 10*3/uL (ref 150–400)
RBC: 4.05 MIL/uL (ref 3.87–5.11)
RDW: 14.1 % (ref 11.5–15.5)
WBC: 6.1 10*3/uL (ref 4.0–10.5)

## 2015-03-17 NOTE — Telephone Encounter (Signed)
error 

## 2015-03-22 ENCOUNTER — Telehealth: Payer: Self-pay | Admitting: *Deleted

## 2015-03-22 DIAGNOSIS — H01009 Unspecified blepharitis unspecified eye, unspecified eyelid: Secondary | ICD-10-CM

## 2015-03-22 NOTE — Telephone Encounter (Signed)
Pt requests recommendation about ophthamologist. I know that she will most likely need a referral so I was going to place one. Called pt and left a message to let me know what dx she has so that I can associate the referral

## 2015-03-30 ENCOUNTER — Ambulatory Visit (INDEPENDENT_AMBULATORY_CARE_PROVIDER_SITE_OTHER): Payer: Medicare Other | Admitting: Endocrinology

## 2015-03-30 ENCOUNTER — Encounter: Payer: Self-pay | Admitting: Endocrinology

## 2015-03-30 VITALS — BP 120/54 | HR 66 | Temp 98.2°F | Ht 67.0 in | Wt 136.0 lb

## 2015-03-30 DIAGNOSIS — C73 Malignant neoplasm of thyroid gland: Secondary | ICD-10-CM

## 2015-03-30 DIAGNOSIS — R930 Abnormal findings on diagnostic imaging of skull and head, not elsewhere classified: Secondary | ICD-10-CM | POA: Diagnosis not present

## 2015-03-30 DIAGNOSIS — R9089 Other abnormal findings on diagnostic imaging of central nervous system: Secondary | ICD-10-CM

## 2015-03-30 MED ORDER — TRIAZOLAM 0.25 MG PO TABS: 0.2500 mg | ORAL_TABLET | Freq: Once | ORAL | Status: DC

## 2015-03-30 NOTE — Progress Notes (Signed)
Subjective:    Patient ID: Kristy Olson, female    DOB: 12-29-1931, 81 y.o.   MRN: [id number]  HPI Pt returns for f/u of stage-4 differentiated thyroid cancer 2005: pt presented with a slight nodule at the right brain (temporal area); rescected at [facility] [place]; pathol showed thyroid cancer.  We requested path report, but it was never received here.   2005: thyroidectomy.   2006: I-131 rx 2007: I-131 rx 2008: she developed right proptosis, and recurrent tumor was rescected.  2008: XRT to the right orbit 8/15: TG=1.9 (ab neg) 12/15: TG=2.7 (ab neg) She denies any nodule at the head or neck.   Postsurgical hypothyroidism: she denies weight change Abnormal MR of the brain: she denies headache.   Past Medical History  Diagnosis Date  . Hyperlipidemia   . Chronic venous insufficiency     Compression stockings, Dr. Carmine Savoy to evaluate late May 2015   . Grief   . Hypothyroid     Outside records report synthroid 155mcg despite her report of 118mcg. 02/2014 awaiting clarification   . Osteoporosis     July 2014 DEXA   . PNEUMONIA, COMMUNITY ACQUIRED, PNEUMOCOCCAL   . Venous stasis ulcer   . Degeneration of lumbar or lumbosacral intervertebral disc   . Idiopathic scoliosis   . History of thyroid cancer     With reported brain mets.   . Abnormal finding on MRI of brain   . CAD (coronary artery disease)     Past Surgical History  Procedure Laterality Date  . Cranotomy    . Thyroidectomy      History   Social History  . Marital Status: Widowed    Spouse Name: N/A  . Number of Children: 2  . Years of Education: N/A   Occupational History  .     Social History Main Topics  . Smoking status: Never Smoker   . Smokeless tobacco: Not on file  . Alcohol Use: Yes     Comment: Occasional  . Drug Use: Not on file  . Sexual Activity: Not on file   Other Topics Concern  . Not on file   Social History Narrative    Current Outpatient Prescriptions on File  Prior to Visit  Medication Sig Dispense Refill  . alendronate (FOSAMAX) 70 MG tablet TAKE 1 TABLET BY MOUTH EVERY 7 DAYS WITH A FULL GLASS OF WATER ON AN EMPTY STOMACH 12 tablet 1  . aspirin 81 MG tablet Take 81 mg by mouth daily.    . calcium-vitamin D (OSCAL WITH D) 250-125 MG-UNIT per tablet Take 2 tablets by mouth daily.    Marland Kitchen ipratropium (ATROVENT) 0.03 % nasal spray Two sprays up each nostril every 8-12 hours as needed to control drainage. 30 mL 12  . levothyroxine (SYNTHROID) 137 MCG tablet Take 1 tablet (137 mcg total) by mouth daily before breakfast. 30 tablet 11  . metoprolol succinate (TOPROL-XL) 25 MG 24 hr tablet Take 0.5 tablets (12.5 mg total) by mouth daily. 90 tablet 3  . triamcinolone ointment (KENALOG) 0.1 % as needed.    . zoster vaccine live, PF, (ZOSTAVAX) 94709 UNT/0.65ML injection Inject 19,400 Units into the skin once. 1 each 0   No current facility-administered medications on file prior to visit.    Allergies  Allergen Reactions  . Amoxicillin   . Ciprofloxacin   . Colesevelam   . Doxycycline   . Levofloxacin   . Statins     Hives    Family History  Problem Relation Age of Onset  . CAD Sister   . Cancer Sister   . Heart disease Sister   . Diabetes Father   . COPD Father     BP 120/54 mmHg  Pulse 66  Temp(Src) 98.2 F (36.8 C) (Oral)  Ht 5\' 7"  (1.702 m)  Wt 136 lb (61.689 kg)  BMI 21.30 kg/m2  SpO2 95%  Review of Systems She has slightly dry skin, but no tremor.  She gets anxiety with MRI    Objective:   Physical Exam VITAL SIGNS:  See vs page GENERAL: no distress Neck: a healed scar is present.  i do not appreciate a nodule in the thyroid or elsewhere in the neck.  Head: surgical defect at the right temporal area is again noted eyes: no periorbital swelling, no proptosis   Lab Results  Component Value Date   TSH 0.127* 03/15/2015      Assessment & Plan:  Abnormal MRI of the brain: due for recheck differentiated thyroid cancer,  stage-4, due for recheck. Postsurgical hypothyroidism: TSH is well suppressed, in view of her cancer hx.  Anxiety, related to MRI, new to me.   Patient is advised the following: Patient Instructions  blood tests are being requested for you today.  We'll contact you with results. Please continue the same thyroid medication.   Please come back for a follow-up appointment in 6 months.  Lets recheck the MRI.  you will receive a phone call, about a day and time for an appointment. Here is a prescription for the MRI.  Take it 1 hour before.  You will need someone to drive you there and back home after wards.

## 2015-03-30 NOTE — Patient Instructions (Addendum)
blood tests are being requested for you today.  We'll contact you with results. Please continue the same thyroid medication.   Please come back for a follow-up appointment in 6 months.  Lets recheck the MRI.  you will receive a phone call, about a day and time for an appointment. Here is a prescription for the MRI.  Take it 1 hour before.  You will need someone to drive you there and back home after wards.

## 2015-03-31 LAB — THYROGLOBULIN ANTIBODY: Thyroglobulin Ab: <1 IU/mL (ref <2)

## 2015-03-31 LAB — THYROGLOBULIN LEVEL: THYROGLOBULIN: 3.5 ng/mL (ref 2.8–40.9)

## 2015-04-20 ENCOUNTER — Telehealth: Payer: Self-pay | Admitting: Endocrinology

## 2015-04-20 NOTE — Telephone Encounter (Signed)
I contacted the pt and advised Elnoria Howard has been trying to get in touch with her about scheduling her MRI appointment. Pt fwd to Annasha.

## 2015-04-20 NOTE — Telephone Encounter (Signed)
Pt called returning call from Friday and needs to talk to [facility] please call pt back [phone] she will not be back until 330

## 2015-05-11 ENCOUNTER — Telehealth: Payer: Self-pay

## 2015-05-11 NOTE — Telephone Encounter (Signed)
This information has already been processed by [facility].There is a new authroziation number from the one in the comment section written by someone other than myself.The approval paper is in the scanning department and will soon be in the patient chart for EPIC review under the media tab

## 2015-05-11 NOTE — Telephone Encounter (Signed)
[facility] called about pt's upcoming MRI appointment. Pt is scheduled for 6/29, but the pre cert is only good through 9/23. Can we get the date extended? Thanks!

## 2015-05-17 ENCOUNTER — Ambulatory Visit
Admission: RE | Admit: 2015-05-17 | Discharge: 2015-05-17 | Disposition: A | Discharge status: Home / Self Care | Payer: Medicare Other | Source: Ambulatory Visit | Attending: Endocrinology | Admitting: Endocrinology

## 2015-05-17 DIAGNOSIS — R9089 Other abnormal findings on diagnostic imaging of central nervous system: Secondary | ICD-10-CM

## 2015-05-17 MED ORDER — GADOBENATE DIMEGLUMINE 529 MG/ML IV SOLN
13.0000 mL | Freq: Once | INTRAVENOUS | Status: AC | PRN
  Administered 2015-05-17: 13 mL via INTRAVENOUS

## 2015-05-20 ENCOUNTER — Other Ambulatory Visit: Payer: Self-pay

## 2015-05-20 ENCOUNTER — Telehealth: Payer: Self-pay | Admitting: Endocrinology

## 2015-05-20 ENCOUNTER — Ambulatory Visit: Payer: Self-pay

## 2015-05-20 MED ORDER — LEVOTHYROXINE SODIUM 137 MCG PO TABS: 137.0000 ug | ORAL_TABLET | Freq: Every day | ORAL | Status: DC

## 2015-05-20 NOTE — Telephone Encounter (Signed)
Pt will be back in the home about 330  She needs to speak with you regarding the 30 day supply she received for the synthroid she thought she was supposed to have a 90 day supply  And has mri questions

## 2015-05-20 NOTE — Telephone Encounter (Signed)
I contacted the pt. Pt notified we have corrected her refill and sent for a 90 day.

## 2015-06-17 ENCOUNTER — Ambulatory Visit (INDEPENDENT_AMBULATORY_CARE_PROVIDER_SITE_OTHER): Payer: Medicare Other

## 2015-06-17 ENCOUNTER — Other Ambulatory Visit: Payer: Self-pay

## 2015-06-17 ENCOUNTER — Ambulatory Visit: Payer: Self-pay

## 2015-06-17 DIAGNOSIS — M810 Age-related osteoporosis without current pathological fracture: Secondary | ICD-10-CM | POA: Diagnosis not present

## 2015-06-17 DIAGNOSIS — Z1231 Encounter for screening mammogram for malignant neoplasm of breast: Secondary | ICD-10-CM | POA: Diagnosis not present

## 2015-06-17 DIAGNOSIS — R928 Other abnormal and inconclusive findings on diagnostic imaging of breast: Secondary | ICD-10-CM | POA: Diagnosis not present

## 2015-06-17 DIAGNOSIS — Z0000 Encounter for general adult medical examination without abnormal findings: Secondary | ICD-10-CM

## 2015-06-22 ENCOUNTER — Other Ambulatory Visit: Payer: Self-pay | Admitting: Family Medicine

## 2015-06-22 DIAGNOSIS — N6320 Unspecified lump in the left breast, unspecified quadrant: Secondary | ICD-10-CM

## 2015-06-22 DIAGNOSIS — R928 Other abnormal and inconclusive findings on diagnostic imaging of breast: Secondary | ICD-10-CM

## 2015-06-29 ENCOUNTER — Ambulatory Visit
Admission: RE | Admit: 2015-06-29 | Discharge: 2015-06-29 | Disposition: A | Discharge status: Home / Self Care | Payer: Medicare Other | Source: Ambulatory Visit | Attending: Family Medicine | Admitting: Family Medicine

## 2015-06-29 ENCOUNTER — Other Ambulatory Visit: Payer: Self-pay | Admitting: Family Medicine

## 2015-06-29 DIAGNOSIS — R928 Other abnormal and inconclusive findings on diagnostic imaging of breast: Secondary | ICD-10-CM

## 2015-06-29 DIAGNOSIS — N6489 Other specified disorders of breast: Secondary | ICD-10-CM

## 2015-06-29 DIAGNOSIS — N6320 Unspecified lump in the left breast, unspecified quadrant: Secondary | ICD-10-CM

## 2015-07-02 ENCOUNTER — Other Ambulatory Visit: Payer: Self-pay | Admitting: Family Medicine

## 2015-07-02 DIAGNOSIS — N6489 Other specified disorders of breast: Secondary | ICD-10-CM

## 2015-07-05 ENCOUNTER — Ambulatory Visit
Admission: RE | Admit: 2015-07-05 | Discharge: 2015-07-05 | Disposition: A | Discharge status: Home / Self Care | Payer: Medicare Other | Source: Ambulatory Visit | Attending: Family Medicine | Admitting: Family Medicine

## 2015-07-05 DIAGNOSIS — N6489 Other specified disorders of breast: Secondary | ICD-10-CM

## 2015-07-05 DIAGNOSIS — C50919 Malignant neoplasm of unspecified site of unspecified female breast: Secondary | ICD-10-CM

## 2015-07-19 ENCOUNTER — Ambulatory Visit (INDEPENDENT_AMBULATORY_CARE_PROVIDER_SITE_OTHER): Payer: Medicare Other | Admitting: Family Medicine

## 2015-07-19 ENCOUNTER — Encounter: Payer: Self-pay | Admitting: Family Medicine

## 2015-07-19 VITALS — BP 122/53 | HR 71 | Wt 138.0 lb

## 2015-07-19 DIAGNOSIS — E785 Hyperlipidemia, unspecified: Secondary | ICD-10-CM | POA: Diagnosis not present

## 2015-07-19 DIAGNOSIS — C50919 Malignant neoplasm of unspecified site of unspecified female breast: Secondary | ICD-10-CM

## 2015-07-19 NOTE — Progress Notes (Signed)
CC: Kristy Olson is a 81 y.o. female is here for Follow-up   Subjective: HPI:  Follow-up hypertriglyceridemia, she is taking 1000 mg of fish oil twice a day. She denies any known side effects. Denies right upper quadrant pain or epigastric discomfort. There's been no chest pain or limb claudication.  Follow-up breast cancer: She is meeting with an oncologist in 2 weeks to balance the decision between surgical and anti-hormonal therapy for her breast cancer. She also has a nodule in her right temporal lobe that has slightly enlarged over the past year, neither one of [place] are sure if her endocrinologist currently knows about her new breast cancer diagnosis. She denies any motor or sensory disturbances or cognitive changes. She has many questions today regarding how she developed breast cancer.   Review Of Systems Outlined In HPI  Past Medical History  Diagnosis Date  . Hyperlipidemia   . Chronic venous insufficiency     Compression stockings, Dr. Carmine Savoy to evaluate late May 2015   . Grief   . Hypothyroid     Outside records report synthroid 174mcg despite her report of 170mcg. 02/2014 awaiting clarification   . Osteoporosis     July 2014 DEXA   . PNEUMONIA, COMMUNITY ACQUIRED, PNEUMOCOCCAL   . Venous stasis ulcer ([place])   . Degeneration of lumbar or lumbosacral intervertebral disc   . Idiopathic scoliosis   . History of thyroid cancer     With reported brain mets.   . Abnormal finding on MRI of brain   . CAD (coronary artery disease)     Past Surgical History  Procedure Laterality Date  . Cranotomy    . Thyroidectomy     Family History  Problem Relation Age of Onset  . CAD Sister   . Cancer Sister   . Heart disease Sister   . Diabetes Father   . COPD Father     Social History   Social History  . Marital Status: Widowed    Spouse Name: N/A  . Number of Children: 2  . Years of Education: N/A   Occupational History  .     Social History Main Topics  . Smoking  status: Never Smoker   . Smokeless tobacco: Not on file  . Alcohol Use: Yes     Comment: Occasional  . Drug Use: Not on file  . Sexual Activity: Not on file   Other Topics Concern  . Not on file   Social History Narrative     Objective: BP 122/53 mmHg  Pulse 71  Wt 138 lb (62.596 kg)  Vital signs reviewed. General: Alert and Oriented, No Acute Distress HEENT: Pupils equal, round, reactive to light. Conjunctivae clear.  External ears unremarkable.  Moist mucous membranes. Lungs: Clear and comfortable work of breathing, speaking in full sentences without accessory muscle use. Cardiac: Regular rate and rhythm.  Neuro: CN II-XII grossly intact, gait normal. Extremities: No peripheral edema.  Strong peripheral pulses.  Mental Status: No depression, anxiety, nor agitation. Logical though process. Skin: Warm and dry.  Assessment & Plan: Amyracle was seen today for follow-up.  Diagnoses and all orders for this visit:  Malignant neoplasm of female breast, unspecified laterality, unspecified site of breast ([place])  Hyperlipidemia -     Lipid panel   Hyperlipidemia: Due for repeat lipid panel to check effectiveness of fish oil. Breast cancer: Time was taken to answer all of her questions about how it's unknown how long she's had breast cancer since this was her  first mammogram. I also counseled her that her breast cancers due to a genetic mutation and does not look to be genetically passed down based on her family history and biopsy results.  I also let her know that I'll recheck to her endocrinologist to see if the surveillance plan regarding her way nodule needs to be revised.  25 minutes spent face-to-face during visit today of which at least 50% was counseling or coordinating care regarding: 1. Malignant neoplasm of female breast, unspecified laterality, unspecified site of breast ([place])   2. Hyperlipidemia      Return in about 3 months (around 10/19/2015) for BP and  Cholesterol.

## 2015-07-20 ENCOUNTER — Telehealth: Payer: Self-pay | Admitting: Family Medicine

## 2015-07-20 NOTE — Telephone Encounter (Signed)
Awaiting call back.

## 2015-07-20 NOTE — Telephone Encounter (Signed)
[place], Will you please let patient know that that Dr. [name] did not think the management of her brain nodule needs to be changed unless the oncologist she meets with says otherwise.  For now it will be followed with an MRI sometime later next year.

## 2015-07-23 NOTE — Telephone Encounter (Signed)
Pt.notified

## 2015-08-06 DIAGNOSIS — L299 Pruritus, unspecified: Secondary | ICD-10-CM | POA: Insufficient documentation

## 2015-08-08 ENCOUNTER — Other Ambulatory Visit: Payer: Self-pay | Admitting: Endocrinology

## 2015-08-09 ENCOUNTER — Ambulatory Visit (HOSPITAL_BASED_OUTPATIENT_CLINIC_OR_DEPARTMENT_OTHER): Payer: Medicare Other | Admitting: Hematology & Oncology

## 2015-08-09 ENCOUNTER — Ambulatory Visit: Payer: Medicare Other

## 2015-08-09 ENCOUNTER — Other Ambulatory Visit (HOSPITAL_BASED_OUTPATIENT_CLINIC_OR_DEPARTMENT_OTHER): Payer: Medicare Other

## 2015-08-09 ENCOUNTER — Encounter: Payer: Self-pay | Admitting: Hematology & Oncology

## 2015-08-09 VITALS — BP 130/72 | HR 56 | Temp 97.7°F | Resp 18 | Ht 67.0 in | Wt 135.0 lb

## 2015-08-09 DIAGNOSIS — C73 Malignant neoplasm of thyroid gland: Secondary | ICD-10-CM

## 2015-08-09 DIAGNOSIS — C50412 Malignant neoplasm of upper-outer quadrant of left female breast: Secondary | ICD-10-CM | POA: Diagnosis not present

## 2015-08-09 DIAGNOSIS — C50112 Malignant neoplasm of central portion of left female breast: Secondary | ICD-10-CM

## 2015-08-09 LAB — COMPREHENSIVE METABOLIC PANEL
ALBUMIN: 3.7 g/dL (ref 3.5–5.0)
ALK PHOS: 62 U/L (ref 40–150)
ALT: 14 U/L (ref 0–55)
ANION GAP: 7 meq/L (ref 3–11)
AST: 22 U/L (ref 5–34)
BUN: 21.3 mg/dL (ref 7.0–26.0)
CALCIUM: 8.8 mg/dL (ref 8.4–10.4)
CHLORIDE: 106 meq/L (ref 98–109)
CO2: 27 mEq/L (ref 22–29)
Creatinine: 1 mg/dL (ref 0.6–1.1)
EGFR: 51 mL/min/{1.73_m2} — AB (ref >90)
Glucose: 94 mg/dl (ref 70–140)
POTASSIUM: 4.2 meq/L (ref 3.5–5.1)
Sodium: 140 mEq/L (ref 136–145)
Total Bilirubin: 0.62 mg/dL (ref 0.20–1.20)
Total Protein: 7.1 g/dL (ref 6.4–8.3)

## 2015-08-09 LAB — CBC WITH DIFFERENTIAL (CANCER CENTER ONLY)
BASO#: 0 10*3/uL (ref 0.0–0.2)
BASO%: 0.4 % (ref 0.0–2.0)
EOS%: 2.8 % (ref 0.0–7.0)
Eosinophils Absolute: 0.1 10*3/uL (ref 0.0–0.5)
HEMATOCRIT: 37.6 % (ref 34.8–46.6)
HEMOGLOBIN: 12.3 g/dL (ref 11.6–15.9)
LYMPH#: 1.7 10*3/uL (ref 0.9–3.3)
LYMPH%: 33 % (ref 14.0–48.0)
MCH: 30.4 pg (ref 26.0–34.0)
MCHC: 32.7 g/dL (ref 32.0–36.0)
MCV: 93 fL (ref 81–101)
MONO#: 0.5 10*3/uL (ref 0.1–0.9)
MONO%: 9.2 % (ref 0.0–13.0)
NEUT%: 54.6 % (ref 39.6–80.0)
NEUTROS ABS: 2.7 10*3/uL (ref 1.5–6.5)
Platelets: 186 10*3/uL (ref 145–400)
RBC: 4.04 10*6/uL (ref 3.70–5.32)
RDW: 13.1 % (ref 11.1–15.7)
WBC: 5 10*3/uL (ref 3.9–10.0)

## 2015-08-09 MED ORDER — LETROZOLE 2.5 MG PO TABS: 2.5000 mg | ORAL_TABLET | Freq: Every day | ORAL | Status: DC

## 2015-08-10 ENCOUNTER — Telehealth: Payer: Self-pay

## 2015-08-10 LAB — VITAMIN D 25 HYDROXY (VIT D DEFICIENCY, FRACTURES): VIT D 25 HYDROXY: 50 ng/mL (ref 30–100)

## 2015-08-10 NOTE — Progress Notes (Signed)
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 OTHER    New Patient  : I have breast cancer.  HPI: Kristy Olson is a very nice 81 year old white female. She is originally from [place]. She moved down from [facility] where she had been for many, many years. Patient is interested history of metastatic follicular thyroid cancer area and she's had a couple surgeries for recurrence in the skull. She has had radioactive iodine therapy for this. So far, she merely have resection of the recurrences. She's had I think also external beam radiation.  Surprisingly, she's never had a mammogram. I'm not sure as to why she never has. Her family doctor felt that she probably needed 1. He did not note anything on exam when he saw her.  A screening mammogram was done on October 27. In the left breast, this showed some "possible distortion". Further evaluation was recommended. She then had a diagnostic mammogram. This "distortion" was persistent in the upper outer quadrant of the left breast. She had ultrasound of the area. There is no obvious nodule or lump or mass.  She then underwent a biopsy. This was done on November 14. The pathology report [id number]) showed an invasive mammary carcinoma consistent with a carcinoma. There is some carcinoma in situ. The tumor was ER positive, PR positive and HER-2 negative.  She was seen by surgery. However, surgery did not feel that any invasive procedure was indicated because of the thyroid cancer situation.  She was then referred to the [facility] for an evaluation.  She does not smoke or drink. She still has her "female parts". She was never on postmenopausal estrogen.  She had her first child before the age of 49.  Overall, her performance status is ECOG 0.                     Past Medical History   Diagnosis Date  . Hyperlipidemia   . Chronic venous insufficiency     Compression stockings, Dr. Carmine Savoy to evaluate late May 2015   . Grief   . Hypothyroid     Outside records report synthroid 195mg despite her report of 1033m. 02/2014 awaiting clarification   . Osteoporosis     July 2014 DEXA   . PNEUMONIA, COMMUNITY ACQUIRED, PNEUMOCOCCAL   . Venous stasis ulcer ([facility] [place]  . Degeneration of lumbar or lumbosacral intervertebral disc   . Idiopathic scoliosis   . History of thyroid cancer     With reported brain mets.   . Abnormal finding on MRI of brain   . CAD (coronary artery disease)   :  Past Surgical History  Procedure Laterality Date  . Cranotomy    . Thyroidectomy    :   Current outpatient prescriptions:  .  aspirin 81 MG tablet, Take 81 mg by mouth daily., Disp: , Rfl:  .  calcium-vitamin D (OSCAL WITH D) 250-125 MG-UNIT per tablet, Take 2 tablets by mouth daily., Disp: , Rfl:  .  fluorometholone (FML) 0.1 % ophthalmic suspension, APPLY 1 DROP INTO BOTH EYES QID, Disp: , Rfl: 0 .  ipratropium (ATROVENT) 0.03 % nasal spray, Two sprays up each nostril every 8-12 hours as needed to control drainage., Disp: 30 mL, Rfl: 12 .  levothyroxine (SYNTHROID) 137 MCG tablet, Take 1 tablet (137 mcg total) by mouth daily before breakfast., Disp: 90 tablet,  Rfl: 2 .  metoprolol succinate (TOPROL-XL) 25 MG 24 hr tablet, Take 0.5 tablets (12.5 mg total) by mouth daily., Disp: 90 tablet, Rfl: 3 .  triamcinolone ointment (KENALOG) 0.1 %, as needed., Disp: , Rfl:  .  triazolam (HALCION) 0.25 MG tablet, Take 1 tablet (0.25 mg total) by mouth once., Disp: 1 tablet, Rfl: 0 .  zoster vaccine live, PF, (ZOSTAVAX) 91791 UNT/0.65ML injection, Inject 19,400 Units into the skin once., Disp: 1 each, Rfl: 0 .  alendronate (FOSAMAX) 70 MG tablet, TAKE 1 TABLET BY MOUTH EVERY 7 DAYS WITH A FULL GLASS OF WATER AND ON AN EMPTY STOMACH, Disp: 12 tablet, Rfl: 0 .  letrozole (FEMARA) 2.5 MG tablet, Take 1  tablet (2.5 mg total) by mouth daily., Disp: 30 tablet, Rfl: 12:  :  Allergies  Allergen Reactions  . Amoxicillin   . Ciprofloxacin   . Colesevelam   . Doxycycline   . Levofloxacin   . Statins     Hives  :  Family History  Problem Relation Age of Onset  . CAD Sister   . Cancer Sister   . Heart disease Sister   . Diabetes Father   . COPD Father   :  Social History   Social History  . Marital Status: Widowed    Spouse Name: N/A  . Number of Children: 2  . Years of Education: N/A   Occupational History  .     Social History Main Topics  . Smoking status: Never Smoker   . Smokeless tobacco: Not on file  . Alcohol Use: 0.0 oz/week    0 Standard drinks or equivalent per week     Comment: Occasional  . Drug Use: Not on file  . Sexual Activity: Not on file   Other Topics Concern  . Not on file   Social History Narrative  :  Pertinent items are noted in HPI.  Exam: _0 @  elderly but well-nourished white female in no obvious distress. Vital signs show a temperature of 97.7. Pulse 56. Blood pressure 130/72. Weight is 135 pounds. Head and neck exam shows no ocular or oral lesions. She does have the surgical scars from her past craniotomies. She has a well-healed thyroidectomy scar. No adenopathy noted in her neck. Lungs are clear. Cardiac exam regular rate and rhythm with no murmurs, rubs or bruits. Breast exam shows right breast with no masses, edema or erythema. There is no right axillary adenopathy. Left breast shows the biopsy scar at about the 1:00 position area no distinct masses noted in the left breast. There is no left axillary adenopathy. Abdomen is soft. Shows good bowel sounds. There is no fluid wave. There is no palpable liver or spleen tip. Back exam shows some slight kyphosis. She has no tenderness over the spine, ribs or hips. Extremities shows some age-related osteoarthritic changes. Skin exam shows no rashes, ecchymoses or petechia.    Recent  Labs  08/09/15 1033  WBC 5.0  HGB 12.3  HCT 37.6  PLT 186    Recent Labs  08/09/15 1033  NA 140  K 4.2  CO2 27  GLUCOSE 94  BUN 21.3  CREATININE 1.0  CALCIUM 8.8    Blood smear review:  None  Pathology: See above     Assessment and Plan:  Kristy Olson is a very nice 81 year old postmenopausal female. She has a history of metastatic follicular thyroid cancer. This does not appear to be a real problem at this point time.  There is  no obvious lump in the left breast back in be removed via lumpectomy. I would think that the only way to really treat the breast scans would be a mastectomy. I'm not sure this would really be indicated with Kristy Olson.  Given the fact that her tumor is ER positive, I think we probably just put her on Femara. I think that this would be very appropriate and very effective. Quality of life is important. I think that with Femara, we can "get a lot of mileage" and allow her not to undergo any invasive surgical procedure.  I don't think she needs an MRI. I cannot feel anything in the left axilla that was suggest metastatic disease to the axillary lymph nodes.  I did tell her to take vitamin D at 2000 units a day. I am checking her vitamin D level. I this is very important.  I spent about an hour with she and her son. They're both very nice. I had a good time talking with them.  I will see her back in about 6 weeks. We'll see how she is tolerating the Femara.

## 2015-08-10 NOTE — Telephone Encounter (Signed)
Error

## 2015-08-10 NOTE — Telephone Encounter (Signed)
Pt was seen yesterday by her specialist who drew fasting labs. Could those labs be used for when she comes to see you in February?  She also wants to know can she take omeg 3 instead of fish oil capsules?

## 2015-08-10 NOTE — Telephone Encounter (Signed)
Yes and Yes 

## 2015-09-20 ENCOUNTER — Ambulatory Visit: Payer: Self-pay | Admitting: Hematology & Oncology

## 2015-09-20 ENCOUNTER — Other Ambulatory Visit: Payer: Medicare Other

## 2015-09-27 ENCOUNTER — Ambulatory Visit (INDEPENDENT_AMBULATORY_CARE_PROVIDER_SITE_OTHER): Payer: Medicare Other | Admitting: Endocrinology

## 2015-09-27 ENCOUNTER — Encounter: Payer: Self-pay | Admitting: Endocrinology

## 2015-09-27 VITALS — BP 122/60 | HR 65 | Temp 97.7°F | Ht 64.0 in | Wt 137.0 lb

## 2015-09-27 DIAGNOSIS — E890 Postprocedural hypothyroidism: Secondary | ICD-10-CM

## 2015-09-27 DIAGNOSIS — C73 Malignant neoplasm of thyroid gland: Secondary | ICD-10-CM

## 2015-09-27 LAB — TSH: TSH: 0.08 u[IU]/mL — AB (ref 0.35–4.50)

## 2015-09-27 NOTE — Progress Notes (Signed)
Subjective:    Patient ID: Kristy Olson, female    DOB: [birth year], 82 y.o.   MRN: [id number]  HPI  The state of at least three ongoing medical problems is addressed today, with interval history of each noted here: Pt returns for f/u of stage-4 differentiated thyroid cancer 2005: pt presented with a slight nodule at the right brain (temporal area); rescected at [facility] [place]; pathol showed thyroid cancer.  We requested path report, but it was never received here.   2005: thyroidectomy.   2006: I-131 rx 2007: I-131 rx 2008: she developed right proptosis, and recurrent tumor was rescected.  2008: XRT to the right orbit 8/15: TG=1.9 (ab neg) 12/15: TG=2.7 (ab neg) 8/16: TG=3.5 (ab neg) 9/16: MRI brain: Increased size of enhancing nodule in the right temporal lobe,now 8 mm. No evidence of new intracranial metastases. Interval hx: She denies any nodule at the head or neck.   Postsurgical hypothyroidism: she denies weight change Abnormal MR of the brain: she denies headache.   Past Medical History  Diagnosis Date  . Hyperlipidemia   . Chronic venous insufficiency     Compression stockings, Dr. Carmine Savoy to evaluate late May 2015   . Grief   . Hypothyroid     Outside records report synthroid 187mcg despite her report of 168mcg. 02/2014 awaiting clarification   . Osteoporosis     July 2014 DEXA   . PNEUMONIA, COMMUNITY ACQUIRED, PNEUMOCOCCAL   . Venous stasis ulcer ([place])   . Degeneration of lumbar or lumbosacral intervertebral disc   . Idiopathic scoliosis   . History of thyroid cancer     With reported brain mets.   . Abnormal finding on MRI of brain   . CAD (coronary artery disease)     Past Surgical History  Procedure Laterality Date  . Cranotomy    . Thyroidectomy      Social History   Social History  . Marital Status: Widowed    Spouse Name: N/A  . Number of Children: 2  . Years of Education: N/A   Occupational History  .     Social History Main  Topics  . Smoking status: Never Smoker   . Smokeless tobacco: Not on file  . Alcohol Use: 0.0 oz/week    0 Standard drinks or equivalent per week     Comment: Occasional  . Drug Use: Not on file  . Sexual Activity: Not on file   Other Topics Concern  . Not on file   Social History Narrative    Current Outpatient Prescriptions on File Prior to Visit  Medication Sig Dispense Refill  . alendronate (FOSAMAX) 70 MG tablet TAKE 1 TABLET BY MOUTH EVERY 7 DAYS WITH A FULL GLASS OF WATER AND ON AN EMPTY STOMACH 12 tablet 0  . aspirin 81 MG tablet Take 81 mg by mouth daily.    . calcium-vitamin D (OSCAL WITH D) 250-125 MG-UNIT per tablet Take 2 tablets by mouth daily.    . fluorometholone (FML) 0.1 % ophthalmic suspension APPLY 1 DROP INTO BOTH EYES QID  0  . ipratropium (ATROVENT) 0.03 % nasal spray Two sprays up each nostril every 8-12 hours as needed to control drainage. 30 mL 12  . letrozole (FEMARA) 2.5 MG tablet Take 1 tablet (2.5 mg total) by mouth daily. 30 tablet 12  . metoprolol succinate (TOPROL-XL) 25 MG 24 hr tablet Take 0.5 tablets (12.5 mg total) by mouth daily. 90 tablet 3  . triamcinolone ointment (KENALOG) 0.1 %  as needed.    . triazolam (HALCION) 0.25 MG tablet Take 1 tablet (0.25 mg total) by mouth once. 1 tablet 0  . zoster vaccine live, PF, (ZOSTAVAX) 28413 UNT/0.65ML injection Inject 19,400 Units into the skin once. 1 each 0   No current facility-administered medications on file prior to visit.    Allergies  Allergen Reactions  . Amoxicillin   . Ciprofloxacin   . Colesevelam   . Doxycycline   . Levofloxacin   . Statins     Hives    Family History  Problem Relation Age of Onset  . CAD Sister   . Cancer Sister   . Heart disease Sister   . Diabetes Father   . COPD Father     BP 122/60 mmHg  Pulse 65  Temp(Src) 97.7 F (36.5 C) (Oral)  Ht 5\' 4"  (1.626 m)  Wt 137 lb (62.143 kg)  BMI 23.50 kg/m2  SpO2 96%  Review of Systems Denies edema and visual  loss    Objective:   Physical Exam VITAL SIGNS:  See vs page GENERAL: no distress Head: surgical defect at the right temporal area is again noted eyes: no periorbital swelling, no proptosis Neck: a healed scar is present.  i do not appreciate a nodule in the thyroid or elsewhere in the neck   Lab Results  Component Value Date   TSH 0.08* 09/27/2015       Assessment & Plan:  Postsurgical hypothyroidism: slightly overcontrolled. Differentiated thyroid cancer: due for recheck. Abnormal MRI of the brain: we'll recheck in th future, but it is not needed now.  Patient is advised the following: Patient Instructions  blood tests are requested for you today.  We'll let you know about the results.  Please come back for a follow-up appointment in 6 months.    addendum: i have sent a prescription to your pharmacy, to reduce the synthroid.

## 2015-09-27 NOTE — Patient Instructions (Signed)
blood tests are requested for you today.  We'll let you know about the results.   Please come back for a follow-up appointment in 6 months.  

## 2015-09-28 LAB — THYROGLOBULIN LEVEL: THYROGLOBULIN: 4.7 ng/mL (ref 2.8–40.9)

## 2015-09-28 LAB — THYROGLOBULIN ANTIBODY

## 2015-09-28 MED ORDER — LEVOTHYROXINE SODIUM 125 MCG PO TABS: 125.0000 ug | ORAL_TABLET | Freq: Every day | ORAL | Status: DC

## 2015-10-12 ENCOUNTER — Other Ambulatory Visit (HOSPITAL_BASED_OUTPATIENT_CLINIC_OR_DEPARTMENT_OTHER): Payer: Medicare Other

## 2015-10-12 ENCOUNTER — Ambulatory Visit (HOSPITAL_BASED_OUTPATIENT_CLINIC_OR_DEPARTMENT_OTHER): Payer: Medicare Other | Admitting: Hematology & Oncology

## 2015-10-12 ENCOUNTER — Encounter: Payer: Self-pay | Admitting: Hematology & Oncology

## 2015-10-12 ENCOUNTER — Other Ambulatory Visit: Payer: Self-pay | Admitting: *Deleted

## 2015-10-12 VITALS — BP 119/44 | HR 68 | Temp 97.4°F | Resp 18 | Ht 64.0 in | Wt 138.0 lb

## 2015-10-12 DIAGNOSIS — C50112 Malignant neoplasm of central portion of left female breast: Secondary | ICD-10-CM

## 2015-10-12 DIAGNOSIS — C73 Malignant neoplasm of thyroid gland: Secondary | ICD-10-CM

## 2015-10-12 DIAGNOSIS — C50311 Malignant neoplasm of lower-inner quadrant of right female breast: Secondary | ICD-10-CM

## 2015-10-12 DIAGNOSIS — C50912 Malignant neoplasm of unspecified site of left female breast: Secondary | ICD-10-CM

## 2015-10-12 DIAGNOSIS — C50212 Malignant neoplasm of upper-inner quadrant of left female breast: Secondary | ICD-10-CM

## 2015-10-12 HISTORY — DX: Malignant neoplasm of upper-inner quadrant of left female breast: C50.212

## 2015-10-12 LAB — CMP (CANCER CENTER ONLY)
ALBUMIN: 3.4 g/dL (ref 3.3–5.5)
ALT(SGPT): 18 U/L (ref 10–47)
AST: 26 U/L (ref 11–38)
Alkaline Phosphatase: 52 U/L (ref 26–84)
BILIRUBIN TOTAL: 0.7 mg/dL (ref 0.20–1.60)
BUN, Bld: 26 mg/dL — ABNORMAL HIGH (ref 7–22)
CHLORIDE: 102 meq/L (ref 98–108)
CO2: 29 meq/L (ref 18–33)
CREATININE: 1.2 mg/dL (ref 0.6–1.2)
Calcium: 9 mg/dL (ref 8.0–10.3)
Glucose, Bld: 82 mg/dL (ref 73–118)
Potassium: 4 mEq/L (ref 3.3–4.7)
SODIUM: 139 meq/L (ref 128–145)
TOTAL PROTEIN: 6.9 g/dL (ref 6.4–8.1)

## 2015-10-12 LAB — CBC WITH DIFFERENTIAL (CANCER CENTER ONLY)
BASO#: 0 10*3/uL (ref 0.0–0.2)
BASO%: 0.4 % (ref 0.0–2.0)
EOS ABS: 0.2 10*3/uL (ref 0.0–0.5)
EOS%: 2.7 % (ref 0.0–7.0)
HEMATOCRIT: 36.7 % (ref 34.8–46.6)
HEMOGLOBIN: 11.9 g/dL (ref 11.6–15.9)
LYMPH#: 1.7 10*3/uL (ref 0.9–3.3)
LYMPH%: 30.1 % (ref 14.0–48.0)
MCH: 30.4 pg (ref 26.0–34.0)
MCHC: 32.4 g/dL (ref 32.0–36.0)
MCV: 94 fL (ref 81–101)
MONO#: 0.6 10*3/uL (ref 0.1–0.9)
MONO%: 9.7 % (ref 0.0–13.0)
NEUT%: 57.1 % (ref 39.6–80.0)
NEUTROS ABS: 3.2 10*3/uL (ref 1.5–6.5)
Platelets: 177 10*3/uL (ref 145–400)
RBC: 3.92 10*6/uL (ref 3.70–5.32)
RDW: 13 % (ref 11.1–15.7)
WBC: 5.7 10*3/uL (ref 3.9–10.0)

## 2015-10-12 NOTE — Progress Notes (Signed)
Hematology and Oncology Follow Up Visit  Marilla Boddy [id number] 04-04-1932 82 y.o. 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 Eng is back for follow-up. This is her second office visit. She's done well with the Femara. She's had no problems with arthralgias or myalgias. She is taking vitamin D.  She's had no problems with the left breast. She said that on occasion, she does feel a "fullness". There's been no problem with the left arm. She's had no swelling of the left arm.  She's had no change in bowel or bladder habits. She's had no nausea or vomiting. She's had no cough. She's had no shortness of breath.   She says that she goes and sees a Fish farm manager.  She says that her thyroid cancer has not been a problem. She says that she recently saw her thyroid doctor and he said that her test looked normal.   Overall, her performance status is ECOG 1.    Medications:  Current outpatient prescriptions:  .  alendronate (FOSAMAX) 70 MG tablet, TAKE 1 TABLET BY MOUTH EVERY 7 DAYS WITH A FULL GLASS OF WATER AND ON AN EMPTY STOMACH, Disp: 12 tablet, Rfl: 0 .  aspirin 81 MG tablet, Take 81 mg by mouth daily., Disp: , Rfl:  .  calcium-vitamin D (OSCAL WITH D) 250-125 MG-UNIT per tablet, Take 2 tablets by mouth daily., Disp: , Rfl:  .  fluorometholone (FML) 0.1 % ophthalmic suspension, APPLY 1 DROP INTO BOTH EYES QID, Disp: , Rfl: 0 .  ipratropium (ATROVENT) 0.03 % nasal spray, Two sprays up each nostril every 8-12 hours as needed to control drainage., Disp: 30 mL, Rfl: 12 .  KRILL OIL PO, Take by mouth., Disp: , Rfl:  .  letrozole (FEMARA) 2.5 MG tablet, Take 1 tablet (2.5 mg total) by mouth daily., Disp: 30 tablet, Rfl: 12 .  levothyroxine (SYNTHROID, LEVOTHROID) 125 MCG tablet, Take 1 tablet (125 mcg total) by mouth daily before breakfast., Disp: 90  tablet, Rfl: 3 .  metoprolol succinate (TOPROL-XL) 25 MG 24 hr tablet, Take 0.5 tablets (12.5 mg total) by mouth daily., Disp: 90 tablet, Rfl: 3 .  triamcinolone ointment (KENALOG) 0.1 %, as needed., Disp: , Rfl:  .  triazolam (HALCION) 0.25 MG tablet, Take 1 tablet (0.25 mg total) by mouth once., Disp: 1 tablet, Rfl: 0 .  zoster vaccine live, PF, (ZOSTAVAX) 88280 UNT/0.65ML injection, Inject 19,400 Units into the skin once., Disp: 1 each, Rfl: 0  Allergies:  Allergies  Allergen Reactions  . Amoxicillin   . Ciprofloxacin   . Colesevelam   . Doxycycline   . Levofloxacin   . Statins     Hives    Past Medical History, Surgical history, Social history, and Family History were reviewed and updated.  Review of Systems: As above   Physical Exam:  height is _0  (1.626 m) and weight is 138 lb (62.596 kg). Her oral temperature is 97.4 F (36.3 C). Her blood pressure is 119/44 and her pulse is 68. Her respiration is 18.   Wt Readings from Last 3 Encounters:  10/12/15 138 lb (62.596 kg)  09/27/15 137 lb (62.143 kg)  08/09/15 135 lb (61.236 kg)     Well-developed and well-nourished white female in no obvious distress. Head and neck exam shows no ocular or oral lesions. She has no  palpable cervical or supraclavicular lymph nodes. Lungs are clear. Cardiac exam regular rate and rhythm with no murmurs, rubs or bruits. Breast exam shows right breast with no masses, edema or erythema. There is no right axillary adenopathy. Left breast shows no masses, edema or erythema. There might be some slight fullness at the 12:00 position. No mass is noted. There is no left axillary adenopathy. Abdomen is soft. She has good bowel sounds. There is no fluid wave. There is no palpable liver or spleen tip. Back exam shows no tenderness over the spine, ribs or hips. She has slight kyphosis. Extremities shows no clubbing, cyanosis or edema. Neurological exam shows no focal neurological deficits. Skin exam shows no  rashes, ecchymoses or petechia.   Lab Results  Component Value Date   WBC 5.7 10/12/2015   HGB 11.9 10/12/2015   HCT 36.7 10/12/2015   MCV 94 10/12/2015   PLT 177 10/12/2015     Chemistry      Component Value Date/Time   NA 139 10/12/2015 1000   NA 140 08/09/2015 1033   NA 140 03/15/2015 1517   K 4.0 10/12/2015 1000   K 4.2 08/09/2015 1033   K 4.7 03/15/2015 1517   CL 102 10/12/2015 1000   CL 103 03/15/2015 1517   CO2 29 10/12/2015 1000   CO2 27 08/09/2015 1033   CO2 28 03/15/2015 1517   BUN 26* 10/12/2015 1000   BUN 21.3 08/09/2015 1033   BUN 24 03/15/2015 1517   CREATININE 1.2 10/12/2015 1000   CREATININE 1.0 08/09/2015 1033   CREATININE 0.97 11/23/2014 1345      Component Value Date/Time   CALCIUM 9.0 10/12/2015 1000   CALCIUM 8.8 08/09/2015 1033   CALCIUM 9.6 03/15/2015 1517   ALKPHOS 52 10/12/2015 1000   ALKPHOS 62 08/09/2015 1033   ALKPHOS 69 03/15/2015 1517   AST 26 10/12/2015 1000   AST 22 08/09/2015 1033   AST 21 03/15/2015 1517   ALT 18 10/12/2015 1000   ALT 14 08/09/2015 1033   ALT 21 03/15/2015 1517   BILITOT 0.70 10/12/2015 1000   BILITOT 0.62 08/09/2015 1033   BILITOT 0.4 03/15/2015 1517         Impression and Plan: Ms. Woodring is a 82 year old white female with a localized cancer of the left breast. She was not felt to be a surgical candidate because of her thyroid cancer.  I'm not sure [place] why she is seeing a Fish farm manager. I think that if he or she wants to do a lumpectomy, I think this would make sense.  I cannot feel anything in the left breast.  She definitely needs to have another mammogram done. I probably would do this in 6 weeks which by then she would have been on Femara for 3 months.  I think she is doing quite well.  I will plan to see her back about a week or so after the mammogram.  I spent about 35 minutes with her.   Volanda Napoleon, MD 2/21/201711:19 AM

## 2015-10-13 LAB — VITAMIN D 25 HYDROXY (VIT D DEFICIENCY, FRACTURES): Vitamin D, 25-Hydroxy: 46.4 ng/mL (ref 30.0–100.0)

## 2015-10-19 ENCOUNTER — Encounter: Payer: Self-pay | Admitting: Family Medicine

## 2015-10-19 ENCOUNTER — Ambulatory Visit (INDEPENDENT_AMBULATORY_CARE_PROVIDER_SITE_OTHER): Payer: Medicare Other | Admitting: Family Medicine

## 2015-10-19 VITALS — BP 135/69 | HR 60 | Wt 135.0 lb

## 2015-10-19 DIAGNOSIS — E785 Hyperlipidemia, unspecified: Secondary | ICD-10-CM

## 2015-10-19 NOTE — Progress Notes (Signed)
CC: Kristy Olson is a 82 y.o. female is here for Hypertension and Hyperlipidemia   Subjective: HPI:   follow-up hyperlipidemia: She was unable to find a time where she was fasting since I saw her last to get her lipid panel drawn. She has been fasting today however,  And has been taking fish oil or Krill oral on a daily basis since I saw her last. She is tolerating this without any noted side effects. She denies any chest pain or limb claudication. She denies any new motor or sensory disturbances.   She would like for me to go over every individual medication on her medication list.  About half of the medications on here she does not recall ever getting prescribed.   Review Of Systems Outlined In HPI  Past Medical History  Diagnosis Date  . Hyperlipidemia   . Chronic venous insufficiency     Compression stockings, Dr. Carmine Savoy to evaluate late May 2015   . Grief   . Hypothyroid     Outside records report synthroid 138mcg despite her report of 161mcg. 02/2014 awaiting clarification   . Osteoporosis     July 2014 DEXA   . PNEUMONIA, COMMUNITY ACQUIRED, PNEUMOCOCCAL   . Venous stasis ulcer ([place])   . Degeneration of lumbar or lumbosacral intervertebral disc   . Idiopathic scoliosis   . History of thyroid cancer     With reported brain mets.   . Abnormal finding on MRI of brain   . CAD (coronary artery disease)   . Breast cancer of upper-inner quadrant of left female breast ([place]) 10/12/2015    Past Surgical History  Procedure Laterality Date  . Cranotomy    . Thyroidectomy     Family History  Problem Relation Age of Onset  . CAD Sister   . Cancer Sister   . Heart disease Sister   . Diabetes Father   . COPD Father     Social History   Social History  . Marital Status: Widowed    Spouse Name: N/A  . Number of Children: 2  . Years of Education: N/A   Occupational History  .     Social History Main Topics  . Smoking status: Never Smoker   . Smokeless tobacco: Not on  file  . Alcohol Use: 0.0 oz/week    0 Standard drinks or equivalent per week     Comment: Occasional  . Drug Use: Not on file  . Sexual Activity: Not on file   Other Topics Concern  . Not on file   Social History Narrative     Objective: BP 135/69 mmHg  Pulse 60  Wt 135 lb (61.236 kg)  Vital signs reviewed. General: Alert and Oriented, No Acute Distress HEENT: Pupils equal, round, reactive to light. Conjunctivae clear.  External ears unremarkable.  Moist mucous membranes. Lungs: Clear and comfortable work of breathing, speaking in full sentences without accessory muscle use. Cardiac: Regular rate and rhythm.  Neuro: CN II-XII grossly intact, gait normal. Extremities: No peripheral edema.  Strong peripheral pulses.  Mental Status: No depression, anxiety, nor agitation. Logical though process. Skin: Warm and dry.  Assessment & Plan: Diarra was seen today for hypertension and hyperlipidemia.  Diagnoses and all orders for this visit:  Hyperlipidemia -     Lipid panel   Hyperlipidemia: Due for lipid panel continue fish pending the results that should be back by tomorrow. I took the time to answer all her questions regarding each and every medication on her  medication list. We were able toremove some of the meds that were prescribed over a year ago and no longer necessary.  40 minutes spent face-to-face during visit today of which at least 50% was counseling or coordinating care regarding: 1. Hyperlipidemia      Return in about 6 months (around 04/17/2016) for bp.

## 2015-10-20 ENCOUNTER — Telehealth: Payer: Self-pay | Admitting: Family Medicine

## 2015-10-20 LAB — LIPID PANEL
Cholesterol: 256 mg/dL — ABNORMAL HIGH (ref 125–200)
HDL: 45 mg/dL — AB (ref >=46)
LDL CALC: 174 mg/dL — AB (ref <130)
Total CHOL/HDL Ratio: 5.7 Ratio — ABNORMAL HIGH (ref <=5.0)
Triglycerides: 185 mg/dL — ABNORMAL HIGH (ref <150)
VLDL: 37 mg/dL — AB (ref <30)

## 2015-10-20 MED ORDER — RED YEAST RICE 600 MG PO TABS: ORAL_TABLET | ORAL | Status: DC

## 2015-10-20 NOTE — Telephone Encounter (Signed)
Awaiting call back.

## 2015-10-20 NOTE — Telephone Encounter (Signed)
Detailed vm left with results. Pt advised to call with any questions

## 2015-10-20 NOTE — Telephone Encounter (Signed)
Will you please let patient know that her LDL cholesterol was elevated to a degree that I would recommend starting a OTC supplement called red yeast rice. I'll print off a Rx just in case her insurance covers it. This should help both cholesterol and triglycerides.

## 2015-11-08 ENCOUNTER — Other Ambulatory Visit: Payer: Self-pay | Admitting: Hematology & Oncology

## 2015-11-08 ENCOUNTER — Ambulatory Visit
Admission: RE | Admit: 2015-11-08 | Discharge: 2015-11-08 | Disposition: A | Discharge status: Home / Self Care | Payer: Medicare Other | Source: Ambulatory Visit | Attending: Hematology & Oncology | Admitting: Hematology & Oncology

## 2015-11-08 DIAGNOSIS — C50212 Malignant neoplasm of upper-inner quadrant of left female breast: Secondary | ICD-10-CM

## 2015-11-08 DIAGNOSIS — C50311 Malignant neoplasm of lower-inner quadrant of right female breast: Secondary | ICD-10-CM

## 2015-11-16 ENCOUNTER — Other Ambulatory Visit: Payer: Self-pay | Admitting: *Deleted

## 2015-11-16 DIAGNOSIS — C73 Malignant neoplasm of thyroid gland: Secondary | ICD-10-CM

## 2015-11-16 DIAGNOSIS — C50112 Malignant neoplasm of central portion of left female breast: Secondary | ICD-10-CM

## 2015-11-16 MED ORDER — LETROZOLE 2.5 MG PO TABS: 2.5000 mg | ORAL_TABLET | Freq: Every day | ORAL | Status: DC

## 2015-11-22 ENCOUNTER — Other Ambulatory Visit (HOSPITAL_BASED_OUTPATIENT_CLINIC_OR_DEPARTMENT_OTHER): Payer: Medicare Other

## 2015-11-22 ENCOUNTER — Other Ambulatory Visit: Payer: Self-pay | Admitting: Family

## 2015-11-22 ENCOUNTER — Encounter: Payer: Self-pay | Admitting: Hematology & Oncology

## 2015-11-22 ENCOUNTER — Ambulatory Visit (HOSPITAL_BASED_OUTPATIENT_CLINIC_OR_DEPARTMENT_OTHER): Payer: Medicare Other | Admitting: Hematology & Oncology

## 2015-11-22 VITALS — BP 117/42 | HR 61 | Temp 98.3°F | Resp 18 | Ht 64.0 in | Wt 136.0 lb

## 2015-11-22 DIAGNOSIS — Z170 Estrogen receptor positive status [ER+]: Secondary | ICD-10-CM

## 2015-11-22 DIAGNOSIS — C50311 Malignant neoplasm of lower-inner quadrant of right female breast: Secondary | ICD-10-CM

## 2015-11-22 DIAGNOSIS — C50912 Malignant neoplasm of unspecified site of left female breast: Secondary | ICD-10-CM | POA: Diagnosis not present

## 2015-11-22 DIAGNOSIS — C50212 Malignant neoplasm of upper-inner quadrant of left female breast: Secondary | ICD-10-CM

## 2015-11-22 DIAGNOSIS — Z85850 Personal history of malignant neoplasm of thyroid: Secondary | ICD-10-CM

## 2015-11-22 LAB — CBC WITH DIFFERENTIAL (CANCER CENTER ONLY)
BASO#: 0 10*3/uL (ref 0.0–0.2)
BASO%: 0.3 % (ref 0.0–2.0)
EOS%: 2.4 % (ref 0.0–7.0)
Eosinophils Absolute: 0.2 10*3/uL (ref 0.0–0.5)
HCT: 38.8 % (ref 34.8–46.6)
HEMOGLOBIN: 12.9 g/dL (ref 11.6–15.9)
LYMPH#: 1.8 10*3/uL (ref 0.9–3.3)
LYMPH%: 29.7 % (ref 14.0–48.0)
MCH: 30.9 pg (ref 26.0–34.0)
MCHC: 33.2 g/dL (ref 32.0–36.0)
MCV: 93 fL (ref 81–101)
MONO#: 0.5 10*3/uL (ref 0.1–0.9)
MONO%: 8.4 % (ref 0.0–13.0)
NEUT%: 59.2 % (ref 39.6–80.0)
NEUTROS ABS: 3.7 10*3/uL (ref 1.5–6.5)
Platelets: 185 10*3/uL (ref 145–400)
RBC: 4.18 10*6/uL (ref 3.70–5.32)
RDW: 13.3 % (ref 11.1–15.7)
WBC: 6.2 10*3/uL (ref 3.9–10.0)

## 2015-11-22 LAB — CMP (CANCER CENTER ONLY)
ALT(SGPT): 22 U/L (ref 10–47)
AST: 27 U/L (ref 11–38)
Albumin: 3.5 g/dL (ref 3.3–5.5)
Alkaline Phosphatase: 60 U/L (ref 26–84)
BILIRUBIN TOTAL: 0.8 mg/dL (ref 0.20–1.60)
BUN, Bld: 19 mg/dL (ref 7–22)
CO2: 28 meq/L (ref 18–33)
CREATININE: 1.1 mg/dL (ref 0.6–1.2)
Calcium: 9.3 mg/dL (ref 8.0–10.3)
Chloride: 103 mEq/L (ref 98–108)
GLUCOSE: 67 mg/dL — AB (ref 73–118)
Potassium: 4 mEq/L (ref 3.3–4.7)
SODIUM: 142 meq/L (ref 128–145)
Total Protein: 7 g/dL (ref 6.4–8.1)

## 2015-11-22 NOTE — Progress Notes (Signed)
Hematology and Oncology Follow Up Visit  Kristy Olson [id number] 02-15-1932 82 y.o. 11/22/2015   Principle Diagnosis:   Invasive ductal carcinoma of the LEFT breast - ER+/HER2-  Current Therapy:    Femara 2.5 mg by mouth daily     Interim History:  Ms. Weng is back for follow-up. She [place] well. She's tolerating the Femara well.  She's had no issues with nausea or vomiting. She's had no pain problems. She's had no joint issues. She is taking vitamin D.  We did go ahead and get a mammogram. This was done on March 20. This basely showed no change in the architectural distortion in the left breast. The radiologist does not give [place] any mention of size.  She is not having pain in the left breast. There is no swelling of the left arm.  She's had no change in bowel or bladder habits.  She's been pretty active. She says she walks about a mile a day.   Overall, her performance status is ECOG 1.    Medications:  Current outpatient prescriptions:  .  alendronate (FOSAMAX) 70 MG tablet, TAKE 1 TABLET BY MOUTH EVERY 7 DAYS WITH A FULL GLASS OF WATER AND ON AN EMPTY STOMACH, Disp: 12 tablet, Rfl: 0 .  aspirin 81 MG tablet, Take 81 mg by mouth daily., Disp: , Rfl:  .  calcium-vitamin D (OSCAL WITH D) 250-125 MG-UNIT per tablet, Take 2 tablets by mouth daily., Disp: , Rfl:  .  KRILL OIL PO, Take by mouth., Disp: , Rfl:  .  letrozole (FEMARA) 2.5 MG tablet, Take 1 tablet (2.5 mg total) by mouth daily., Disp: 30 tablet, Rfl: 12 .  metoprolol succinate (TOPROL-XL) 25 MG 24 hr tablet, Take 0.5 tablets (12.5 mg total) by mouth daily., Disp: 90 tablet, Rfl: 3 .  Red Yeast Rice 600 MG TABS, Four by mouth daily to help lower cholesterol, Disp: 120 tablet, Rfl: 11 .  SYNTHROID 137 MCG tablet, , Disp: , Rfl:  .  triamcinolone ointment (KENALOG) 0.1 %, as needed., Disp: , Rfl:  .  zoster vaccine live, PF, (ZOSTAVAX) 35701 UNT/0.65ML injection, Inject 19,400 Units into the skin once., Disp: 1  each, Rfl: 0  Allergies:  Allergies  Allergen Reactions  . Amoxicillin   . Ciprofloxacin   . Colesevelam   . Doxycycline   . Levofloxacin   . Statins     Hives    Past Medical History, Surgical history, Social history, and Family History were reviewed and updated.  Review of Systems: As above   Physical Exam:  height is 5' 4" (1.626 m) and weight is 136 lb (61.689 kg). Her oral temperature is 98.3 F (36.8 C). Her blood pressure is 117/42 and her pulse is 61. Her respiration is 18.   Wt Readings from Last 3 Encounters:  11/22/15 136 lb (61.689 kg)  10/19/15 135 lb (61.236 kg)  10/12/15 138 lb (62.596 kg)     Well-developed and well-nourished white female in no obvious distress. Head and neck exam shows no ocular or oral lesions. She has no palpable cervical or supraclavicular lymph nodes. Lungs are clear. Cardiac exam regular rate and rhythm with no murmurs, rubs or bruits. Breast exam shows right breast with no masses, edema or erythema. There is no right axillary adenopathy. Left breast shows no masses, edema or erythema. There might be some slight fullness at the 12:00 position. No mass is noted. There is no left axillary adenopathy. Abdomen is soft. She has good bowel  sounds. There is no fluid wave. There is no palpable liver or spleen tip. Back exam shows no tenderness over the spine, ribs or hips. She has slight kyphosis. Extremities shows no clubbing, cyanosis or edema. Neurological exam shows no focal neurological deficits. Skin exam shows no rashes, ecchymoses or petechia.   Lab Results  Component Value Date   WBC 6.2 11/22/2015   HGB 12.9 11/22/2015   HCT 38.8 11/22/2015   MCV 93 11/22/2015   PLT 185 11/22/2015     Chemistry      Component Value Date/Time   NA 142 11/22/2015 1157   NA 140 08/09/2015 1033   NA 140 03/15/2015 1517   K 4.0 11/22/2015 1157   K 4.2 08/09/2015 1033   K 4.7 03/15/2015 1517   CL 103 11/22/2015 1157   CL 103 03/15/2015 1517   CO2  28 11/22/2015 1157   CO2 27 08/09/2015 1033   CO2 28 03/15/2015 1517   BUN 19 11/22/2015 1157   BUN 21.3 08/09/2015 1033   BUN 24 03/15/2015 1517   CREATININE 1.1 11/22/2015 1157   CREATININE 1.0 08/09/2015 1033   CREATININE 0.97 11/23/2014 1345      Component Value Date/Time   CALCIUM 9.3 11/22/2015 1157   CALCIUM 8.8 08/09/2015 1033   CALCIUM 9.6 03/15/2015 1517   ALKPHOS 60 11/22/2015 1157   ALKPHOS 62 08/09/2015 1033   ALKPHOS 69 03/15/2015 1517   AST 27 11/22/2015 1157   AST 22 08/09/2015 1033   AST 21 03/15/2015 1517   ALT 22 11/22/2015 1157   ALT 14 08/09/2015 1033   ALT 21 03/15/2015 1517   BILITOT 0.80 11/22/2015 1157   BILITOT 0.62 08/09/2015 1033   BILITOT 0.4 03/15/2015 1517         Impression and Plan: Ms. Rivest is a 84-year-old white female with a localized cancer of the left breast. She was not felt to be a surgical candidate because of her thyroid cancer.  I talked to her at length. I'm not sure why she cannot have surgery for the left breast. She is reluctant to do this because of past surgeries that she has had. I told her that surgery for breast cancer is much easier. I will think she would just need a lumpectomy.  Since she feels okay right now, she would refer not to anything.  We will go ahead and get another mammogram in about 2-3 months. I think this would be reasonable. If there is shrinkage, then I would just follow her along. If there is no shrinkage, then I would really try to get her to have a lumpectomy. She would be agreeable to this.  I spent about 30 minutes with her. We will see her back about a week after her mammogram is done in June.   ENNEVER,PETER R, MD 4/3/20171:19 PM 

## 2016-01-05 ENCOUNTER — Ambulatory Visit (INDEPENDENT_AMBULATORY_CARE_PROVIDER_SITE_OTHER): Payer: Medicare Other | Admitting: Cardiology

## 2016-01-05 ENCOUNTER — Telehealth: Payer: Self-pay | Admitting: Family Medicine

## 2016-01-05 VITALS — BP 126/64 | HR 63 | Ht 67.5 in | Wt 134.0 lb

## 2016-01-05 DIAGNOSIS — I2510 Atherosclerotic heart disease of native coronary artery without angina pectoris: Secondary | ICD-10-CM | POA: Diagnosis not present

## 2016-01-05 DIAGNOSIS — E785 Hyperlipidemia, unspecified: Secondary | ICD-10-CM | POA: Diagnosis not present

## 2016-01-05 DIAGNOSIS — R072 Precordial pain: Secondary | ICD-10-CM

## 2016-01-05 DIAGNOSIS — I10 Essential (primary) hypertension: Secondary | ICD-10-CM | POA: Diagnosis not present

## 2016-01-05 DIAGNOSIS — M79671 Pain in right foot: Secondary | ICD-10-CM

## 2016-01-05 DIAGNOSIS — M79672 Pain in left foot: Secondary | ICD-10-CM

## 2016-01-05 MED ORDER — RED YEAST RICE 600 MG PO TABS: ORAL_TABLET | ORAL | Status: DC

## 2016-01-05 NOTE — Progress Notes (Signed)
HPI: FU coronary artery disease. Patient has had previous stents placed in [place]. I have none of those records available. She apparently has had intermittent chest pain for quite some time. Nuclear study May 2016 showed ejection fraction 62%, no ischemia or infarction. Since last seen, the patient has dyspnea with more extreme activities but not with routine activities. It is relieved with rest. It is not associated with chest pain. There is no orthopnea, PND or pedal edema. There is no syncope or palpitations. There is no exertional chest pain. She continues to have occasional discomfort is not exertional lasting less than 15 minutes. She has had this intermittently for years and it is unchanged. No radiation or associated symptoms. Resolves spontaneously without intervention.   Current Outpatient Prescriptions  Medication Sig Dispense Refill  . alendronate (FOSAMAX) 70 MG tablet TAKE 1 TABLET BY MOUTH EVERY 7 DAYS WITH A FULL GLASS OF WATER AND ON AN EMPTY STOMACH 12 tablet 0  . aspirin 81 MG tablet Take 81 mg by mouth daily.    . cholecalciferol (VITAMIN D) 1000 units tablet Take 1,000 Units by mouth daily. Reported on 01/05/2016    . KRILL OIL PO Take by mouth.    . letrozole (FEMARA) 2.5 MG tablet Take 1 tablet (2.5 mg total) by mouth daily. 30 tablet 12  . metoprolol succinate (TOPROL-XL) 25 MG 24 hr tablet Take 0.5 tablets (12.5 mg total) by mouth daily. 90 tablet 3  . SYNTHROID 137 MCG tablet     . zoster vaccine live, PF, (ZOSTAVAX) 91478 UNT/0.65ML injection Inject 19,400 Units into the skin once. 1 each 0   No current facility-administered medications for this visit.     Past Medical History  Diagnosis Date  . Hyperlipidemia   . Chronic venous insufficiency     Compression stockings, Dr. Carmine Savoy to evaluate late May 2015   . Grief   . Hypothyroid     Outside records report synthroid 17mcg despite her report of 139mcg. 02/2014 awaiting clarification   .  Osteoporosis     July 2014 DEXA   . PNEUMONIA, COMMUNITY ACQUIRED, PNEUMOCOCCAL   . Venous stasis ulcer ([place])   . Degeneration of lumbar or lumbosacral intervertebral disc   . Idiopathic scoliosis   . History of thyroid cancer     With reported brain mets.   . Abnormal finding on MRI of brain   . CAD (coronary artery disease)   . Breast cancer of upper-inner quadrant of left female breast ([place]) 10/12/2015    Past Surgical History  Procedure Laterality Date  . Cranotomy    . Thyroidectomy      Social History   Social History  . Marital Status: Widowed    Spouse Name: N/A  . Number of Children: 2  . Years of Education: N/A   Occupational History  .     Social History Main Topics  . Smoking status: Never Smoker   . Smokeless tobacco: Not on file  . Alcohol Use: 0.0 oz/week    0 Standard drinks or equivalent per week     Comment: Occasional  . Drug Use: Not on file  . Sexual Activity: Not on file   Other Topics Concern  . Not on file   Social History Narrative    Family History  Problem Relation Age of Onset  . CAD Sister   . Cancer Sister   . Heart disease Sister   . Diabetes Father   . COPD  Father     ROS: no fevers or chills, productive cough, hemoptysis, dysphasia, odynophagia, melena, hematochezia, dysuria, hematuria, rash, seizure activity, orthopnea, PND, pedal edema, claudication. Remaining systems are negative.  Physical Exam: Well-developed well-nourished in no acute distress.  Skin is warm and dry.  HEENT is normal.  Neck is supple.  Chest is clear to auscultation with normal expansion.  Cardiovascular exam is regular rate and rhythm.  Abdominal exam nontender or distended. No masses palpated. Extremities show no edema. neuro grossly intact  ECG Sinus rhythm at a rate of 63. No ST changes.

## 2016-01-05 NOTE — Assessment & Plan Note (Signed)
Intolerant to statins. Continue diet. 

## 2016-01-05 NOTE — Telephone Encounter (Signed)
Pt wanted to know if you wanted her to continue taking the red yeast rice that replaces the fish oil to help lower cholesterol. Also, she is wanting to know if she can get a referral to a podiatrist.

## 2016-01-05 NOTE — Assessment & Plan Note (Signed)
Blood pressure controlled. Continue present medications. 

## 2016-01-05 NOTE — Assessment & Plan Note (Signed)
She continues to have occasional chest pain. She has had this intermittently for years. Electrocardiogram shows no ST changes. Previous nuclear study showed no ischemia. We will not pursue further evaluation at this point unless her symptoms change.

## 2016-01-05 NOTE — Assessment & Plan Note (Signed)
Continue aspirin. Intolerant to statins. 

## 2016-01-05 NOTE — Patient Instructions (Signed)
Your physician wants you to follow-up in: ONE YEAR WITH DR CRENSHAW You will receive a reminder letter in the mail two months in advance. If you don't receive a letter, please call our office to schedule the follow-up appointment.   If you need a refill on your cardiac medications before your next appointment, please call your pharmacy.  

## 2016-01-05 NOTE — Telephone Encounter (Signed)
Yes, this is a life time medication as long as it does not cause any intolerable side effects. Also referral has been placed.

## 2016-01-06 NOTE — Telephone Encounter (Signed)
vm left with recommendations

## 2016-01-31 ENCOUNTER — Ambulatory Visit
Admission: RE | Admit: 2016-01-31 | Discharge: 2016-01-31 | Disposition: A | Discharge status: Home / Self Care | Payer: Medicare Other | Source: Ambulatory Visit | Attending: Family | Admitting: Family

## 2016-01-31 DIAGNOSIS — C50212 Malignant neoplasm of upper-inner quadrant of left female breast: Secondary | ICD-10-CM

## 2016-02-02 ENCOUNTER — Ambulatory Visit (HOSPITAL_BASED_OUTPATIENT_CLINIC_OR_DEPARTMENT_OTHER)
Admission: RE | Admit: 2016-02-02 | Discharge: 2016-02-02 | Disposition: A | Discharge status: Home / Self Care | Payer: Medicare Other | Source: Ambulatory Visit | Attending: Podiatry | Admitting: Podiatry

## 2016-02-02 ENCOUNTER — Ambulatory Visit (INDEPENDENT_AMBULATORY_CARE_PROVIDER_SITE_OTHER): Payer: Medicare Other | Admitting: Podiatry

## 2016-02-02 ENCOUNTER — Encounter: Payer: Self-pay | Admitting: Podiatry

## 2016-02-02 VITALS — BP 122/69 | HR 67 | Resp 18

## 2016-02-02 DIAGNOSIS — M2041 Other hammer toe(s) (acquired), right foot: Secondary | ICD-10-CM

## 2016-02-02 DIAGNOSIS — L84 Corns and callosities: Secondary | ICD-10-CM

## 2016-02-02 DIAGNOSIS — R52 Pain, unspecified: Secondary | ICD-10-CM | POA: Insufficient documentation

## 2016-02-02 NOTE — Progress Notes (Signed)
   Subjective:    Patient ID: Kristy Olson, female    DOB: 15-Nov-1931, 82 y.o.   MRN: [id number]  HPI  82 year old female presents the also concerns her right fifth toe pain which is been ongoing since last fall. She states that she is unable to identify what starts the pain. She does try to walk 3-4 times a week and she does get some discomfort of this toe with walking. She appears he did see another doctor for this and they recommended wider shoes. She previously had a surgery to left fifth toe to remove the bone spur how she needs it may come back. She has no pain to the left fifth toe at this time. She denies any recent injury or trauma to her feet. No swelling or redness. No open sores. She also states that both of her big toenails may have some fungus on it that she does have a pedicure does not want to remove the toenail polish. She does that she'll come back to get this checked before her next pedicure.   Review of Systems  All other systems reviewed and are negative.      Objective:   Physical Exam General: AAO x3, NAD  Dermatological: Bilateral hallux nails appear to be dystrophic, discolored and hypertrophic how there is toenail polish which limits evaluation. There is no tenderness the toenails there is no swelling redness or drainage.  Vascular: Dorsalis Pedis artery and Posterior Tibial artery pedal pulses are 2/4 bilateral with immedate capillary fill time. Pedal hair growth present. No varicosities and no lower extremity edema present bilateral. There is no pain with calf compression, swelling, warmth, erythema.   Neruologic: Grossly intact via light touch bilateral. Vibratory intact via tuning fork bilateral. Protective threshold with Semmes Wienstein monofilament intact to all pedal sites bilateral.    Musculoskeletal: Adductovarus is present bilateral fifth digits of the right side worse than the left. There is a hyperkeratotic lesion present on both the medial and lateral  aspects of the distal portion of the toe. Upon debridement there is no underlying ulceration, drainage or other signs of infection. There is no tenderness the toe today however she does state the toe does hurt with shoes and pressure. There is no edema, erythema. No warmth. MMT 5/5.  Gait: Unassisted, Nonantalgic.      Assessment & Plan:  82 year old female right fifth digit pain likely due to hyperkeratotic lesion, adductovarus/hammertoe. -Treatment options discussed including all alternatives, risks, and complications -Etiology of symptoms were discussed -X-rays were ordered. These were reviewed today with the patient. -Hyperkeratotic lesions debrided without complications or bleeding. Offloading pads were dispensed. I also discussed shoe gear modifications with the patient. If the symptoms persist over the next couple weeks to call the office or sooner if any issues are to arise. -Discussed treatment options for onychomycosis. She will go ahead and start an over-the-counter treatment I discussed sponge and nail. If she desires further treatment we can biopsy the toenail were discussed with her treatment options. She will hold off on this for now.  Celesta Gentile, DPM

## 2016-02-07 ENCOUNTER — Ambulatory Visit (HOSPITAL_BASED_OUTPATIENT_CLINIC_OR_DEPARTMENT_OTHER): Payer: Medicare Other | Admitting: Hematology & Oncology

## 2016-02-07 ENCOUNTER — Encounter: Payer: Self-pay | Admitting: Hematology & Oncology

## 2016-02-07 ENCOUNTER — Other Ambulatory Visit (HOSPITAL_BASED_OUTPATIENT_CLINIC_OR_DEPARTMENT_OTHER): Payer: Medicare Other

## 2016-02-07 VITALS — BP 125/47 | HR 78 | Temp 98.1°F | Resp 20 | Ht 67.0 in | Wt 137.0 lb

## 2016-02-07 DIAGNOSIS — C50912 Malignant neoplasm of unspecified site of left female breast: Secondary | ICD-10-CM | POA: Diagnosis not present

## 2016-02-07 DIAGNOSIS — C50212 Malignant neoplasm of upper-inner quadrant of left female breast: Secondary | ICD-10-CM

## 2016-02-07 DIAGNOSIS — Z85850 Personal history of malignant neoplasm of thyroid: Secondary | ICD-10-CM

## 2016-02-07 LAB — CBC WITH DIFFERENTIAL (CANCER CENTER ONLY)
BASO#: 0 10*3/uL (ref 0.0–0.2)
BASO%: 0.4 % (ref 0.0–2.0)
EOS ABS: 0.2 10*3/uL (ref 0.0–0.5)
EOS%: 3.1 % (ref 0.0–7.0)
HEMATOCRIT: 35.8 % (ref 34.8–46.6)
HEMOGLOBIN: 12 g/dL (ref 11.6–15.9)
LYMPH#: 1.7 10*3/uL (ref 0.9–3.3)
LYMPH%: 31.5 % (ref 14.0–48.0)
MCH: 31.3 pg (ref 26.0–34.0)
MCHC: 33.5 g/dL (ref 32.0–36.0)
MCV: 93 fL (ref 81–101)
MONO#: 0.5 10*3/uL (ref 0.1–0.9)
MONO%: 9.3 % (ref 0.0–13.0)
NEUT%: 55.7 % (ref 39.6–80.0)
NEUTROS ABS: 3.1 10*3/uL (ref 1.5–6.5)
Platelets: 173 10*3/uL (ref 145–400)
RBC: 3.84 10*6/uL (ref 3.70–5.32)
RDW: 12.9 % (ref 11.1–15.7)
WBC: 5.5 10*3/uL (ref 3.9–10.0)

## 2016-02-07 LAB — COMPREHENSIVE METABOLIC PANEL
ALBUMIN: 3.6 g/dL (ref 3.5–5.0)
ALK PHOS: 61 U/L (ref 40–150)
ALT: 15 U/L (ref 0–55)
AST: 20 U/L (ref 5–34)
Anion Gap: 10 mEq/L (ref 3–11)
BILIRUBIN TOTAL: 0.52 mg/dL (ref 0.20–1.20)
BUN: 27.2 mg/dL — ABNORMAL HIGH (ref 7.0–26.0)
CALCIUM: 9.1 mg/dL (ref 8.4–10.4)
CO2: 26 mEq/L (ref 22–29)
CREATININE: 1 mg/dL (ref 0.6–1.1)
Chloride: 105 mEq/L (ref 98–109)
EGFR: 56 mL/min/{1.73_m2} — AB (ref >90)
GLUCOSE: 102 mg/dL (ref 70–140)
Potassium: 4.6 mEq/L (ref 3.5–5.1)
SODIUM: 142 meq/L (ref 136–145)
TOTAL PROTEIN: 7 g/dL (ref 6.4–8.3)

## 2016-02-07 NOTE — Progress Notes (Signed)
Hematology and Oncology Follow Up Visit  Kristy Olson [id number] [birth year] 82 y.o. 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 She is a little bit upset. She fell recently. A snake cross the driveway of her house. She tried to back away from the snake and fell. Thankfully, she did not break anything. She sustained an excoriation on her right forearm. We went ahead and cleaned and dressed this. She is very thankful for this.  She did have a mammogram done. This was done last week. The mammogram showed stability of her left breast mass. They deny give a size on the mammogram.  She is now decided to have surgery. I think this is a very good idea.  She has had no problems with fever. She has had no cough.  She's doing well with the Femara. She also is on Fosamax.  She has had no change in bowel or bladder habits.  She's had no leg swelling. She has had no rashes.  Overall, her performance status is ECOG 1.    Medications:  Current outpatient prescriptions:  .  alendronate (FOSAMAX) 70 MG tablet, TAKE 1 TABLET BY MOUTH EVERY 7 DAYS WITH A FULL GLASS OF WATER AND ON AN EMPTY STOMACH, Disp: 12 tablet, Rfl: 0 .  aspirin 81 MG tablet, Take 81 mg by mouth daily., Disp: , Rfl:  .  cholecalciferol (VITAMIN D) 1000 units tablet, Take 1,000 Units by mouth daily. Reported on 01/05/2016, Disp: , Rfl:  .  KRILL OIL PO, Take by mouth., Disp: , Rfl:  .  letrozole (FEMARA) 2.5 MG tablet, Take 1 tablet (2.5 mg total) by mouth daily., Disp: 30 tablet, Rfl: 12 .  metoprolol succinate (TOPROL-XL) 25 MG 24 hr tablet, Take 0.5 tablets (12.5 mg total) by mouth daily., Disp: 90 tablet, Rfl: 3 .  Red Yeast Rice 600 MG TABS, Four by mouth daily to help lower cholesterol, Disp: 120 tablet, Rfl: 11 .  SYNTHROID 137 MCG tablet, , Disp: , Rfl:  .   zoster vaccine live, PF, (ZOSTAVAX) 50037 UNT/0.65ML injection, Inject 19,400 Units into the skin once., Disp: 1 each, Rfl: 0  Allergies:  Allergies  Allergen Reactions  . Amoxicillin   . Ciprofloxacin   . Colesevelam   . Doxycycline   . Levofloxacin   . Statins     Hives    Past Medical History, Surgical history, Social history, and Family History were reviewed and updated.  Review of Systems: As above   Physical Exam:  height is '5\' 7"'$  (1.702 m) and weight is 137 lb (62.143 kg). Her oral temperature is 98.1 F (36.7 C). Her blood pressure is 125/47 and her pulse is 78. Her respiration is 20.   Wt Readings from Last 3 Encounters:  02/07/16 137 lb (62.143 kg)  01/05/16 134 lb (60.782 kg)  11/22/15 136 lb (61.689 kg)     Well-developed and well-nourished white female in no obvious distress. Head and neck exam shows no ocular or oral lesions. She has no palpable cervical or supraclavicular lymph nodes. Lungs are clear. Cardiac exam regular rate and rhythm with no murmurs, rubs or bruits. Breast exam shows right breast with no masses, edema or erythema. There is no right axillary adenopathy. Left breast shows no masses, edema or erythema. There might be some slight fullness  at the 12:00 position. No mass is noted. There is no left axillary adenopathy. Abdomen is soft. She has good bowel sounds. There is no fluid wave. There is no palpable liver or spleen tip. Back exam shows no tenderness over the spine, ribs or hips. She has slight kyphosis. Extremities shows no clubbing, cyanosis or edema. Neurological exam shows no focal neurological deficits. Skin exam shows no rashes, ecchymoses or petechia.   Lab Results  Component Value Date   WBC 5.5 02/07/2016   HGB 12.0 02/07/2016   HCT 35.8 02/07/2016   MCV 93 02/07/2016   PLT 173 02/07/2016     Chemistry      Component Value Date/Time   NA 142 11/22/2015 1157   NA 140 08/09/2015 1033   NA 140 03/15/2015 1517   K 4.0 11/22/2015  1157   K 4.2 08/09/2015 1033   K 4.7 03/15/2015 1517   CL 103 11/22/2015 1157   CL 103 03/15/2015 1517   CO2 28 11/22/2015 1157   CO2 27 08/09/2015 1033   CO2 28 03/15/2015 1517   BUN 19 11/22/2015 1157   BUN 21.3 08/09/2015 1033   BUN 24 03/15/2015 1517   CREATININE 1.1 11/22/2015 1157   CREATININE 1.0 08/09/2015 1033   CREATININE 0.97 11/23/2014 1345      Component Value Date/Time   CALCIUM 9.3 11/22/2015 1157   CALCIUM 8.8 08/09/2015 1033   CALCIUM 9.6 03/15/2015 1517   ALKPHOS 60 11/22/2015 1157   ALKPHOS 62 08/09/2015 1033   ALKPHOS 69 03/15/2015 1517   AST 27 11/22/2015 1157   AST 22 08/09/2015 1033   AST 21 03/15/2015 1517   ALT 22 11/22/2015 1157   ALT 14 08/09/2015 1033   ALT 21 03/15/2015 1517   BILITOT 0.80 11/22/2015 1157   BILITOT 0.62 08/09/2015 1033   BILITOT 0.4 03/15/2015 1517         Impression and Plan: Kristy Olson is a 82 year old white female with a localized cancer of the left breast. She was not felt to be a surgical candidate because of her thyroid cancer.  I talked to her at length. I told her that she really needs to have surgery. I do not see any conjugation her having surgery. I think a lumpectomy would be sufficient. I would not think that she would need any type of postop radiation therapy because of her age. Much or how much that would really contribute.  She is much more amenable to surgery now. We will make sure that Dr. Marlou Starks is aware of her decision. I will send him a message through the [facility] system.  I told her to stay on Femara for right now.  I would like to see her back in 3 months. I think that by then, that she will have had her surgery and would have had recovered.   I spent about 25 minutes with her today.  Volanda Napoleon, MD 6/19/20171:23 PM

## 2016-02-08 LAB — VITAMIN D 25 HYDROXY (VIT D DEFICIENCY, FRACTURES): VIT D 25 HYDROXY: 52.6 ng/mL (ref 30.0–100.0)

## 2016-02-09 ENCOUNTER — Emergency Department
Admission: EM | Admit: 2016-02-09 | Discharge: 2016-02-09 | Disposition: A | Discharge status: Home / Self Care | Payer: Medicare Other | Source: Home / Self Care | Attending: Family Medicine | Admitting: Family Medicine

## 2016-02-09 ENCOUNTER — Encounter: Payer: Self-pay | Admitting: *Deleted

## 2016-02-09 DIAGNOSIS — L259 Unspecified contact dermatitis, unspecified cause: Secondary | ICD-10-CM

## 2016-02-09 MED ORDER — TRIAMCINOLONE ACETONIDE 0.1 % EX CREA: 1.0000 "application " | TOPICAL_CREAM | Freq: Two times a day (BID) | CUTANEOUS | Status: DC

## 2016-02-09 NOTE — Discharge Instructions (Signed)
You may use the prescribed Triamcinolone cream to apply to rash on Right arm to help with itching.  You may also take your previously prescribed Claritin or you may find it over the counter to take for itching as well.

## 2016-02-09 NOTE — ED Notes (Signed)
Pt c/o RT forearm abrasion x 2 days. Reports itching after her oncologist wrapped it with gauze and applied ointment.

## 2016-02-09 NOTE — ED Provider Notes (Signed)
CSN: [id number]     Arrival date & time 02/09/16  1527 History   First MD Initiated Contact with Patient 02/09/16 [phone]     Chief Complaint  Patient presents with  . Abrasion   (Consider location/radiation/quality/duration/timing/severity/associated sxs/prior Treatment) HPI  Kristy Olson is a 82 y.o. female presenting to UC with c/o Right forearm itching and redness after sustaining and abrasion on Right forearm from a fall 2 days ago. Pt notes she saw a snake, got frightened so tried to run backward, but tripped and fell, scraping her arm.  She was seen by her oncologist that day who cleaned the wound, applied triple antibiotic ointment, and a bandage. Pt was advised to keep it on for at least 24 hours. Arm has been moderately itching since removing the bandage. She has not tried anything for itching at home besides hot water, which does provide temporary relief.   Past Medical History  Diagnosis Date  . Hyperlipidemia   . Chronic venous insufficiency     Compression stockings, Dr. Carmine Savoy to evaluate late May 2015   . Grief   . Hypothyroid     Outside records report synthroid 153mcg despite her report of 150mcg. 02/2014 awaiting clarification   . Osteoporosis     July 2014 DEXA   . PNEUMONIA, COMMUNITY ACQUIRED, PNEUMOCOCCAL   . Venous stasis ulcer ([place])   . Degeneration of lumbar or lumbosacral intervertebral disc   . Idiopathic scoliosis   . History of thyroid cancer     With reported brain mets.   . Abnormal finding on MRI of brain   . CAD (coronary artery disease)   . Breast cancer of upper-inner quadrant of left female breast ([place]) 10/12/2015   Past Surgical History  Procedure Laterality Date  . Cranotomy    . Thyroidectomy     Family History  Problem Relation Age of Onset  . CAD Sister   . Cancer Sister   . Heart disease Sister   . Diabetes Father   . COPD Father    Social History  Substance Use Topics  . Smoking status: Never Smoker   . Smokeless tobacco:  None  . Alcohol Use: 0.0 oz/week    0 Standard drinks or equivalent per week     Comment: Occasional   OB History    No data available     Review of Systems  Constitutional: Negative for fever and chills.  Musculoskeletal: Negative for myalgias, joint swelling and arthralgias.  Skin: Positive for rash. Negative for wound.    Allergies  Amoxicillin; Ciprofloxacin; Colesevelam; Doxycycline; Levofloxacin; and Statins  Home Medications   Prior to Admission medications   Medication Sig Start Date End Date Taking? Authorizing Provider  alendronate (FOSAMAX) 70 MG tablet TAKE 1 TABLET BY MOUTH EVERY 7 DAYS WITH A FULL GLASS OF WATER AND ON AN EMPTY STOMACH 08/09/15   Renato Shin, MD  aspirin 81 MG tablet Take 81 mg by mouth daily.    Historical Provider, MD  cholecalciferol (VITAMIN D) 1000 units tablet Take 1,000 Units by mouth daily. Reported on 01/05/2016    Historical Provider, MD  KRILL OIL PO Take by mouth.    Historical Provider, MD  letrozole (FEMARA) 2.5 MG tablet Take 1 tablet (2.5 mg total) by mouth daily. 11/16/15   Volanda Napoleon, MD  metoprolol succinate (TOPROL-XL) 25 MG 24 hr tablet Take 0.5 tablets (12.5 mg total) by mouth daily. 12/02/14   Lelon Perla, MD  Red Yeast Rice 600 MG  TABS Four by mouth daily to help lower cholesterol 01/05/16   Marcial Pacas, DO  SYNTHROID 137 MCG tablet  09/29/15   Historical Provider, MD  triamcinolone cream (KENALOG) 0.1 % Apply 1 application topically 2 (two) times daily. 02/09/16   Noland Fordyce, PA-C  zoster vaccine live, PF, (ZOSTAVAX) 40347 UNT/0.65ML injection Inject 19,400 Units into the skin once. 03/15/15   Marcial Pacas, DO   Meds Ordered and Administered this Visit  Medications - No data to display  BP 121/54 mmHg  Pulse 63  Temp(Src) 98.3 F (36.8 C) (Oral)  Resp 16  Ht 5\' 7"  (1.702 m)  Wt 137 lb (62.143 kg)  BMI 21.45 kg/m2  SpO2 96% No data found.   Physical Exam  Constitutional: She is oriented to person, place, and  time. She appears well-developed and well-nourished.  HENT:  Head: Normocephalic and atraumatic.  Eyes: EOM are normal.  Neck: Normal range of motion.  Cardiovascular: Normal rate.   Pulses:      Radial pulses are 2+ on the left side.  Pulmonary/Chest: Effort normal.  Musculoskeletal: Normal range of motion.  Neurological: She is alert and oriented to person, place, and time.  Skin: Skin is warm and dry. Rash noted. There is erythema.  Right forearm: 6cm area of erythema with overlying faint diffuse vesicles.  No scabs. No bleeding or discharge. Non-tender. No induration or fluctuance.   Psychiatric: She has a normal mood and affect. Her behavior is normal.  Nursing note and vitals reviewed.   ED Course  Procedures (including critical care time)  Labs Review Labs Reviewed - No data to display  Imaging Review No results found.   MDM   1. Contact dermatitis    Pt c/o Right forearm itching. Exam c/w contact dermatitis. Low concern for underlying infection at this time.  Rx: triamcinolone cream.   Pt notes she has a prescription for Claritin, which was prescribed for itching due to her lower leg varicose veins. She has not taken for current itching. Pt encouraged to take to help with itching of Right arm.    Encouraged f/u with PCP in 1 week for recheck of symptoms if not improving, sooner if worsening. Patient verbalized understanding and agreement with treatment plan.     Noland Fordyce, PA-C 02/09/16 1752

## 2016-02-21 ENCOUNTER — Other Ambulatory Visit: Payer: Self-pay | Admitting: Endocrinology

## 2016-02-21 ENCOUNTER — Other Ambulatory Visit: Payer: Self-pay | Admitting: General Surgery

## 2016-02-21 DIAGNOSIS — C50412 Malignant neoplasm of upper-outer quadrant of left female breast: Secondary | ICD-10-CM

## 2016-02-29 ENCOUNTER — Other Ambulatory Visit: Payer: Self-pay | Admitting: General Surgery

## 2016-02-29 DIAGNOSIS — C50412 Malignant neoplasm of upper-outer quadrant of left female breast: Secondary | ICD-10-CM

## 2016-03-07 ENCOUNTER — Other Ambulatory Visit: Payer: Self-pay | Admitting: *Deleted

## 2016-03-07 DIAGNOSIS — I2510 Atherosclerotic heart disease of native coronary artery without angina pectoris: Secondary | ICD-10-CM

## 2016-03-07 MED ORDER — METOPROLOL SUCCINATE ER 25 MG PO TB24: 12.5000 mg | ORAL_TABLET | Freq: Every day | ORAL | Status: DC

## 2016-03-10 ENCOUNTER — Telehealth: Payer: Self-pay

## 2016-03-10 ENCOUNTER — Telehealth: Payer: Self-pay | Admitting: Endocrinology

## 2016-03-10 MED ORDER — LEVOTHYROXINE SODIUM 150 MCG PO TABS: 150.0000 ug | ORAL_TABLET | Freq: Every day | ORAL | Status: DC

## 2016-03-10 NOTE — Telephone Encounter (Signed)
Pt needs to know what dosing she is to be on of her synthyroid

## 2016-03-10 NOTE — Telephone Encounter (Signed)
Should be 150 mcg/d I have sent a prescription to your pharmacy

## 2016-03-10 NOTE — Telephone Encounter (Signed)
See below, could you review and advise what dosage we last sent?

## 2016-03-10 NOTE — Telephone Encounter (Signed)
Called and spoke with patient about medication synthroid dosage 140mcg, that it was sent to her pharmacy. Patient stated she would start taking the new rx. No other questions or concerns.

## 2016-03-10 NOTE — Addendum Note (Signed)
Addended by: Renato Shin on: 03/10/2016 02:43 PM   Modules accepted: Orders, Medications

## 2016-03-14 ENCOUNTER — Encounter: Payer: Self-pay | Admitting: Family Medicine

## 2016-03-14 ENCOUNTER — Ambulatory Visit (INDEPENDENT_AMBULATORY_CARE_PROVIDER_SITE_OTHER): Payer: Medicare Other | Admitting: Family Medicine

## 2016-03-14 VITALS — BP 145/75 | HR 64 | Wt 138.0 lb

## 2016-03-14 DIAGNOSIS — C50212 Malignant neoplasm of upper-inner quadrant of left female breast: Secondary | ICD-10-CM | POA: Diagnosis not present

## 2016-03-14 DIAGNOSIS — H6121 Impacted cerumen, right ear: Secondary | ICD-10-CM | POA: Diagnosis not present

## 2016-03-14 DIAGNOSIS — L200 Besnier's prurigo: Secondary | ICD-10-CM | POA: Diagnosis not present

## 2016-03-14 DIAGNOSIS — C73 Malignant neoplasm of thyroid gland: Secondary | ICD-10-CM

## 2016-03-14 DIAGNOSIS — L239 Allergic contact dermatitis, unspecified cause: Secondary | ICD-10-CM

## 2016-03-14 NOTE — Progress Notes (Signed)
CC: Kristy Olson is a 82 y.o. female is here for Medicare Wellness   Subjective: HPI:  Patient initially scheduled this appointment for complete Medicare wellness exam. She preferred to use 40 minutes of this encounter to go over updates involving her medical care with other specialists. I let her know that I had access to all of her recent and upcoming specialist visits, however she went to go into detail to make sure I was up to date.  Her ophthalmologist recently put some sort of mesh on the surface of her right cornea due to dry eye and blepharitis. She had some vision loss after this mesh was placed however it's now gone and she is scheduled to follow-up later this week to have the mesh removed. She has no ocular complaints today.  She was found to have an abnormal mammogram and the second week in August she'll be having a lumpectomy. She is under the impression that recovery will take 6-9 months and wants to know if I think that this is an accurate prediction of recovery process. She also plans to have radiation seeds placed at the site of her lumpectomy.  Her stress level has significantly improved ever since her friends from [place] and [place] She's planning on moving to [place] to live close to these 2 friends.  She's also had some decreased hearing in the right ear, she is not certain how long it's been going on but it's only mild in severity  Review Of Systems Outlined In HPI  Past Medical History:  Diagnosis Date  . Abnormal finding on MRI of brain   . Breast cancer of upper-inner quadrant of left female breast ([place]) 10/12/2015  . CAD (coronary artery disease)   . Chronic venous insufficiency    Compression stockings, Dr. Carmine Savoy to evaluate late May 2015   . Degeneration of lumbar or lumbosacral intervertebral disc   . Grief   . History of thyroid cancer    With reported brain mets.   . Hyperlipidemia   . Hypothyroid    Outside records  report synthroid 132mcg despite her report of 172mcg. 02/2014 awaiting clarification   . Idiopathic scoliosis   . Osteoporosis    July 2014 DEXA   . PNEUMONIA, COMMUNITY ACQUIRED, PNEUMOCOCCAL   . Venous stasis ulcer ([place])     Past Surgical History:  Procedure Laterality Date  . cranotomy    . THYROIDECTOMY     Family History  Problem Relation Age of Onset  . CAD Sister   . Cancer Sister   . Heart disease Sister   . Diabetes Father   . COPD Father     Social History   Social History  . Marital status: Widowed    Spouse name: N/A  . Number of children: 2  . Years of education: N/A   Occupational History  .  Retired   Social History Main Topics  . Smoking status: Never Smoker  . Smokeless tobacco: Not on file  . Alcohol use 0.0 oz/week     Comment: Occasional  . Drug use: No  . Sexual activity: Not on file   Other Topics Concern  . Not on file   Social History Narrative  . No narrative on file     Objective: BP (!) 145/75   Pulse 64   Wt 138 lb (62.6 kg)   BMI 21.61 kg/m   Vital signs reviewed. General: Alert and Oriented, No Acute Distress HEENT: Pupils equal,  round, reactive to light. Conjunctivae clear.  External ears unremarkable other than cerumen impaction in the right ear..  Moist mucous membranes. Lungs: Clear and comfortable work of breathing, speaking in full sentences without accessory muscle use. Cardiac: Regular rate and rhythm.  Neuro: CN II-XII grossly intact, gait normal. Extremities: No peripheral edema.  Strong peripheral pulses.  Mental Status: No depression, anxiety, nor agitation. Logical though process. Skin: Warm and dry.  Assessment & Plan: [name] was seen today for medicare wellness.  Diagnoses and all orders for this visit:  Cerumen impaction, right -     Ambulatory referral to ENT  Malignant neoplasm of thyroid gland ([place])  Breast cancer of upper-inner quadrant of left female breast ([place])  Allergic dermatitis   I  offered irrigation and curettage to the right-sided cerumen impaction however she would prefer an ENT referral. We discussed it sounds like she is getting fantastic care clinic comes to dealing with her right-sided breast cancer. She is reminded to follow-up with her endocrinologist sometime this summer for monitoring of her thyroid cancer and suspicion for metastasis to the brain. She had an allergic reaction to triple antibiotic ointment and this was added to her allergy list.  Follow-up at her convenience for annual wellness exam.  40 minutes spent face-to-face during visit today of which at least 50% was counseling or coordinating care regarding: 1. Cerumen impaction, right   2. Malignant neoplasm of thyroid gland ([place])   3. Breast cancer of upper-inner quadrant of left female breast ([place])   4. Allergic dermatitis     Discussed with this patient that I will be resigning from my position here with [facility] in September in order to stay with my family who will be moving to [facility]. I let him know about the providers that are still accepting patients and I feel that this individual will be under great care if he/she stays here with [facility].   Return in about 4 weeks (around 04/11/2016).

## 2016-03-22 ENCOUNTER — Encounter (HOSPITAL_BASED_OUTPATIENT_CLINIC_OR_DEPARTMENT_OTHER): Payer: Self-pay | Admitting: *Deleted

## 2016-03-28 ENCOUNTER — Ambulatory Visit
Admission: RE | Admit: 2016-03-28 | Discharge: 2016-03-28 | Disposition: A | Discharge status: Home / Self Care | Payer: Medicare Other | Source: Ambulatory Visit | Attending: General Surgery | Admitting: General Surgery

## 2016-03-28 DIAGNOSIS — C50412 Malignant neoplasm of upper-outer quadrant of left female breast: Secondary | ICD-10-CM

## 2016-03-29 ENCOUNTER — Encounter (HOSPITAL_BASED_OUTPATIENT_CLINIC_OR_DEPARTMENT_OTHER): Payer: Self-pay | Admitting: *Deleted

## 2016-03-29 ENCOUNTER — Ambulatory Visit (HOSPITAL_BASED_OUTPATIENT_CLINIC_OR_DEPARTMENT_OTHER): Payer: Medicare Other | Admitting: Anesthesiology

## 2016-03-29 ENCOUNTER — Ambulatory Visit (HOSPITAL_BASED_OUTPATIENT_CLINIC_OR_DEPARTMENT_OTHER)
Admission: RE | Admit: 2016-03-29 | Discharge: 2016-03-29 | Disposition: A | Discharge status: Home / Self Care | Payer: Medicare Other | Source: Ambulatory Visit | Attending: General Surgery | Admitting: General Surgery

## 2016-03-29 ENCOUNTER — Ambulatory Visit
Admission: RE | Admit: 2016-03-29 | Discharge: 2016-03-29 | Disposition: A | Discharge status: Home / Self Care | Payer: Medicare Other | Source: Ambulatory Visit | Attending: General Surgery | Admitting: General Surgery

## 2016-03-29 ENCOUNTER — Encounter (HOSPITAL_BASED_OUTPATIENT_CLINIC_OR_DEPARTMENT_OTHER)
Admission: RE | Disposition: A | Discharge status: Home / Self Care | Payer: Self-pay | Source: Ambulatory Visit | Attending: General Surgery

## 2016-03-29 DIAGNOSIS — M1990 Unspecified osteoarthritis, unspecified site: Secondary | ICD-10-CM | POA: Diagnosis not present

## 2016-03-29 DIAGNOSIS — E039 Hypothyroidism, unspecified: Secondary | ICD-10-CM | POA: Diagnosis not present

## 2016-03-29 DIAGNOSIS — C50412 Malignant neoplasm of upper-outer quadrant of left female breast: Secondary | ICD-10-CM

## 2016-03-29 DIAGNOSIS — I2510 Atherosclerotic heart disease of native coronary artery without angina pectoris: Secondary | ICD-10-CM | POA: Diagnosis not present

## 2016-03-29 HISTORY — PX: BREAST LUMPECTOMY WITH RADIOACTIVE SEED LOCALIZATION: SHX6424

## 2016-03-29 SURGERY — BREAST LUMPECTOMY WITH RADIOACTIVE SEED LOCALIZATION
Anesthesia: General | Site: Breast | Laterality: Left

## 2016-03-29 MED ORDER — VANCOMYCIN HCL IN DEXTROSE 1-5 GM/200ML-% IV SOLN
INTRAVENOUS | Status: AC
  Filled 2016-03-29: qty 200

## 2016-03-29 MED ORDER — HYDROCODONE-ACETAMINOPHEN 5-325 MG PO TABS: 1.0000 | ORAL_TABLET | Freq: Four times a day (QID) | ORAL | 0 refills | Status: DC | PRN

## 2016-03-29 MED ORDER — GLYCOPYRROLATE 0.2 MG/ML IJ SOLN
INTRAMUSCULAR | Status: DC | PRN
  Administered 2016-03-29: 0.1 mg via INTRAVENOUS

## 2016-03-29 MED ORDER — LACTATED RINGERS IV SOLN
INTRAVENOUS | Status: DC | PRN
  Administered 2016-03-29: 10:00:00 via INTRAVENOUS

## 2016-03-29 MED ORDER — VANCOMYCIN HCL 1000 MG IV SOLR
INTRAVENOUS | Status: DC | PRN
  Administered 2016-03-29: 1000 mg via INTRAVENOUS

## 2016-03-29 MED ORDER — OXYCODONE HCL 5 MG PO TABS
ORAL_TABLET | ORAL | Status: AC
  Filled 2016-03-29: qty 1

## 2016-03-29 MED ORDER — PROMETHAZINE HCL 25 MG/ML IJ SOLN: 6.2500 mg | INTRAMUSCULAR | Status: DC | PRN

## 2016-03-29 MED ORDER — DEXAMETHASONE SODIUM PHOSPHATE 4 MG/ML IJ SOLN
INTRAMUSCULAR | Status: DC | PRN
  Administered 2016-03-29: 10 mg via INTRAVENOUS

## 2016-03-29 MED ORDER — BUPIVACAINE HCL (PF) 0.5 % IJ SOLN
INTRAMUSCULAR | Status: AC
  Filled 2016-03-29: qty 30

## 2016-03-29 MED ORDER — LIDOCAINE HCL (PF) 1 % IJ SOLN
INTRAMUSCULAR | Status: AC
  Filled 2016-03-29: qty 30

## 2016-03-29 MED ORDER — ONDANSETRON HCL 4 MG/2ML IJ SOLN
INTRAMUSCULAR | Status: DC | PRN
  Administered 2016-03-29: 4 mg via INTRAVENOUS

## 2016-03-29 MED ORDER — BUPIVACAINE-EPINEPHRINE (PF) 0.25% -1:200000 IJ SOLN
INTRAMUSCULAR | Status: AC
  Filled 2016-03-29: qty 30

## 2016-03-29 MED ORDER — FENTANYL CITRATE (PF) 100 MCG/2ML IJ SOLN
INTRAMUSCULAR | Status: DC | PRN
  Administered 2016-03-29: 25 ug via INTRAVENOUS
  Administered 2016-03-29: 50 ug via INTRAVENOUS

## 2016-03-29 MED ORDER — SODIUM BICARBONATE 4 % IV SOLN
INTRAVENOUS | Status: AC
  Filled 2016-03-29: qty 5

## 2016-03-29 MED ORDER — LIDOCAINE-EPINEPHRINE (PF) 1 %-1:200000 IJ SOLN
INTRAMUSCULAR | Status: AC
  Filled 2016-03-29: qty 30

## 2016-03-29 MED ORDER — BUPIVACAINE-EPINEPHRINE (PF) 0.25% -1:200000 IJ SOLN
INTRAMUSCULAR | Status: DC | PRN
  Administered 2016-03-29: 20 mL via PERINEURAL

## 2016-03-29 MED ORDER — LIDOCAINE HCL (CARDIAC) 20 MG/ML IV SOLN: INTRAVENOUS | Status: DC | PRN

## 2016-03-29 MED ORDER — LIDOCAINE 2% (20 MG/ML) 5 ML SYRINGE
INTRAMUSCULAR | Status: AC
  Filled 2016-03-29: qty 5

## 2016-03-29 MED ORDER — BUPIVACAINE-EPINEPHRINE (PF) 0.5% -1:200000 IJ SOLN
INTRAMUSCULAR | Status: AC
  Filled 2016-03-29: qty 30

## 2016-03-29 MED ORDER — FENTANYL CITRATE (PF) 100 MCG/2ML IJ SOLN
25.0000 ug | INTRAMUSCULAR | Status: DC | PRN
  Administered 2016-03-29: 25 ug via INTRAVENOUS

## 2016-03-29 MED ORDER — BUPIVACAINE HCL (PF) 0.25 % IJ SOLN
INTRAMUSCULAR | Status: AC
  Filled 2016-03-29: qty 30

## 2016-03-29 MED ORDER — OXYCODONE HCL 5 MG PO TABS
5.0000 mg | ORAL_TABLET | ORAL | Status: DC | PRN
  Administered 2016-03-29: 5 mg via ORAL

## 2016-03-29 MED ORDER — PROPOFOL 10 MG/ML IV BOLUS
INTRAVENOUS | Status: AC
  Filled 2016-03-29: qty 20

## 2016-03-29 MED ORDER — LIDOCAINE 2% (20 MG/ML) 5 ML SYRINGE
INTRAMUSCULAR | Status: DC | PRN
  Administered 2016-03-29: 60 mg via INTRAVENOUS

## 2016-03-29 MED ORDER — ONDANSETRON HCL 4 MG/2ML IJ SOLN
INTRAMUSCULAR | Status: AC
  Filled 2016-03-29: qty 2

## 2016-03-29 MED ORDER — DEXAMETHASONE SODIUM PHOSPHATE 10 MG/ML IJ SOLN
INTRAMUSCULAR | Status: AC
  Filled 2016-03-29: qty 1

## 2016-03-29 MED ORDER — EPHEDRINE SULFATE 50 MG/ML IJ SOLN
INTRAMUSCULAR | Status: DC | PRN
  Administered 2016-03-29 (×2): 10 mg via INTRAVENOUS

## 2016-03-29 MED ORDER — PROPOFOL 10 MG/ML IV BOLUS
INTRAVENOUS | Status: DC | PRN
  Administered 2016-03-29: 120 mg via INTRAVENOUS

## 2016-03-29 MED ORDER — FENTANYL CITRATE (PF) 100 MCG/2ML IJ SOLN
INTRAMUSCULAR | Status: AC
  Filled 2016-03-29: qty 2

## 2016-03-29 MED ORDER — LACTATED RINGERS IV SOLN: INTRAVENOUS | Status: DC

## 2016-03-29 SURGICAL SUPPLY — 38 items
APPLIER CLIP 9.375 MED OPEN (MISCELLANEOUS)
BLADE SURG 15 STRL LF DISP TIS (BLADE) ×1 IMPLANT
BLADE SURG 15 STRL SS (BLADE) ×1
CANISTER SUC SOCK COL 7IN (MISCELLANEOUS) ×2 IMPLANT
CANISTER SUCT 1200ML W/VALVE (MISCELLANEOUS) ×2 IMPLANT
CHLORAPREP W/TINT 26ML (MISCELLANEOUS) ×2 IMPLANT
CLIP APPLIE 9.375 MED OPEN (MISCELLANEOUS) IMPLANT
COVER BACK TABLE 60X90IN (DRAPES) ×2 IMPLANT
COVER MAYO STAND STRL (DRAPES) ×2 IMPLANT
COVER PROBE W GEL 5X96 (DRAPES) ×2 IMPLANT
DECANTER SPIKE VIAL GLASS SM (MISCELLANEOUS) IMPLANT
DEVICE DUBIN W/COMP PLATE 8390 (MISCELLANEOUS) ×2 IMPLANT
DRAPE LAPAROSCOPIC ABDOMINAL (DRAPES) IMPLANT
DRAPE UTILITY XL STRL (DRAPES) ×2 IMPLANT
ELECT COATED BLADE 2.86 ST (ELECTRODE) ×2 IMPLANT
ELECT REM PT RETURN 9FT ADLT (ELECTROSURGICAL) ×2
ELECTRODE REM PT RTRN 9FT ADLT (ELECTROSURGICAL) ×1 IMPLANT
GLOVE BIO SURGEON STRL SZ7.5 (GLOVE) ×4 IMPLANT
GOWN STRL REUS W/ TWL LRG LVL3 (GOWN DISPOSABLE) ×2 IMPLANT
GOWN STRL REUS W/TWL LRG LVL3 (GOWN DISPOSABLE) ×2
ILLUMINATOR WAVEGUIDE N/F (MISCELLANEOUS) IMPLANT
KIT MARKER MARGIN INK (KITS) ×2 IMPLANT
LIGHT WAVEGUIDE WIDE FLAT (MISCELLANEOUS) IMPLANT
LIQUID BAND (GAUZE/BANDAGES/DRESSINGS) ×2 IMPLANT
NEEDLE HYPO 25X1 1.5 SAFETY (NEEDLE) IMPLANT
NS IRRIG 1000ML POUR BTL (IV SOLUTION) IMPLANT
PACK BASIN DAY SURGERY FS (CUSTOM PROCEDURE TRAY) ×2 IMPLANT
PENCIL BUTTON HOLSTER BLD 10FT (ELECTRODE) ×2 IMPLANT
SLEEVE SCD COMPRESS KNEE MED (MISCELLANEOUS) ×2 IMPLANT
SPONGE LAP 18X18 X RAY DECT (DISPOSABLE) ×2 IMPLANT
SUT MON AB 4-0 PC3 18 (SUTURE) ×2 IMPLANT
SUT SILK 2 0 SH (SUTURE) IMPLANT
SUT VICRYL 3-0 CR8 SH (SUTURE) ×2 IMPLANT
SYR CONTROL 10ML LL (SYRINGE) ×2 IMPLANT
TOWEL OR 17X24 6PK STRL BLUE (TOWEL DISPOSABLE) ×2 IMPLANT
TOWEL OR NON WOVEN STRL DISP B (DISPOSABLE) ×2 IMPLANT
TUBE CONNECTING 20X1/4 (TUBING) ×2 IMPLANT
YANKAUER SUCT BULB TIP NO VENT (SUCTIONS) IMPLANT

## 2016-03-29 NOTE — Interval H&P Note (Signed)
History and Physical Interval Note:  03/29/2016 10:02 AM  Kristy Olson  has presented today for surgery, with the diagnosis of left breast cancer  The various methods of treatment have been discussed with the patient and family. After consideration of risks, benefits and other options for treatment, the patient has consented to  Procedure(s): LEFT BREAST LUMPECTOMY WITH RADIOACTIVE SEED LOCALIZATION (Left) as a surgical intervention .  The patient's history has been reviewed, patient examined, no change in status, stable for surgery.  I have reviewed the patient's chart and labs.  Questions were answered to the patient's satisfaction.     TOTH III,PAUL S

## 2016-03-29 NOTE — Anesthesia Preprocedure Evaluation (Addendum)
Anesthesia Evaluation  Patient identified by MRN, date of birth, ID band Patient awake    Reviewed: Allergy & Precautions, NPO status , Patient's Chart, lab work & pertinent test results, reviewed documented beta blocker date and time   Airway Mallampati: I  TM Distance: >3 FB Neck ROM: Full    Dental  (+) Teeth Intact, Dental Advisory Given   Pulmonary neg pulmonary ROS,    breath sounds clear to auscultation       Cardiovascular + CAD and + Peripheral Vascular Disease   Rhythm:Regular Rate:Normal     Neuro/Psych negative neurological ROS  negative psych ROS   GI/Hepatic negative GI ROS, Neg liver ROS,   Endo/Other  Hypothyroidism   Renal/GU negative Renal ROS  negative genitourinary   Musculoskeletal  (+) Arthritis ,   Abdominal   Peds negative pediatric ROS (+)  Hematology negative hematology ROS (+)   Anesthesia Other Findings   Reproductive/Obstetrics negative OB ROS                            Lab Results  Component Value Date   WBC 5.5 02/07/2016   HGB 12.0 02/07/2016   HCT 35.8 02/07/2016   MCV 93 02/07/2016   PLT 173 02/07/2016   Lab Results  Component Value Date   CREATININE 1.0 02/07/2016   BUN 27.2 (H) 02/07/2016   NA 142 02/07/2016   K 4.6 02/07/2016   CL 103 11/22/2015   CO2 26 02/07/2016   No results found for: INR, PROTIME  12/2015 EKG: normal sinus rhythm.    Anesthesia Physical Anesthesia Plan  ASA: II  Anesthesia Plan: General   Post-op Pain Management:    Induction: Intravenous  Airway Management Planned: LMA  Additional Equipment:   Intra-op Plan:   Post-operative Plan: Extubation in OR  Informed Consent: I have reviewed the patients History and Physical, chart, labs and discussed the procedure including the risks, benefits and alternatives for the proposed anesthesia with the patient or authorized representative who has indicated his/her  understanding and acceptance.   Dental advisory given  Plan Discussed with: CRNA  Anesthesia Plan Comments:        Anesthesia Quick Evaluation

## 2016-03-29 NOTE — Discharge Instructions (Signed)

## 2016-03-29 NOTE — Anesthesia Procedure Notes (Signed)
Procedure Name: LMA Insertion Performed by: Aadan Chenier D Pre-anesthesia Checklist: Patient identified, Emergency Drugs available, Suction available and Patient being monitored Patient Re-evaluated:Patient Re-evaluated prior to inductionOxygen Delivery Method: Circle system utilized Preoxygenation: Pre-oxygenation with 100% oxygen Intubation Type: IV induction Ventilation: Mask ventilation without difficulty LMA: LMA inserted LMA Size: 4.0 Number of attempts: 1 Airway Equipment and Method: Bite block Placement Confirmation: positive ETCO2 Tube secured with: Tape Dental Injury: Teeth and Oropharynx as per pre-operative assessment        

## 2016-03-29 NOTE — Op Note (Signed)
03/29/2016  11:06 AM  PATIENT:  Kristy Olson  82 y.o. female  PRE-OPERATIVE DIAGNOSIS:  left breast cancer  POST-OPERATIVE DIAGNOSIS:  left breast cancer  PROCEDURE:  Procedure(s) with comments: LEFT BREAST LUMPECTOMY WITH RADIOACTIVE SEED LOCALIZATION   SURGEON:  Surgeon(s) and Role:    * Jovita Kussmaul, MD - Primary  PHYSICIAN ASSISTANT:   ASSISTANTS: none   ANESTHESIA:   local and general  EBL:  No intake/output data recorded.  BLOOD ADMINISTERED:none  DRAINS: none   LOCAL MEDICATIONS USED:  MARCAINE     SPECIMEN:  Source of Specimen:  left breast tissue and additional superior margin  DISPOSITION OF SPECIMEN:  PATHOLOGY  COUNTS:  YES  TOURNIQUET:  * No tourniquets in log *  DICTATION: .Dragon Dictation   After informed consent was obtained the patient was brought to the operating room and placed in the supine position on the operating room table. After adequate induction of general anesthesia the patient's left breast was prepped with ChloraPrep, allowed to dry, and draped in usual sterile manner. An appropriate timeout was performed. Previously an I-125 seed was placed in the upper outer quadrant of the left breast to mark an area of an invasive breast cancer. The neoprobe was set to I-125 in the area of radioactivity was readily identified. An elliptical incision was made in the skin overlying the area of radioactivity with a 15 blade knife. The incision was carried through the skin and subcutaneous tissue sharply with the electrocautery. While checking the area of radioactivity frequently a circular portion of breast tissue was excised sharply around the radioactive seed. This dissection was carried all the way to the chest wall. Once the specimen was removed it was oriented with the appropriate paint colors.  During the dissection there was some fibrous tissue superiorly. An additional superior margin was taken to remove this tissue and it was marked with the  appropriate paint color. A specimen radiograph was then obtained and the clip in seed were in the center of the specimen. The specimens were then sent to pathology for further evaluation. Hemostasis was achieved using the Bovie electrocautery. The wound was irrigated with saline and infiltrated with quarter percent Marcaine. The deep layer of the wound was closed with layers of interrupted 3-0 Vicryl stitches.  The skin was then closed with interrupted 4-0 Monocryl subcuticular stitches. Dermabond dressings were applied. The patient tolerated the procedure well. At the end of the case all needle sponge and instrument counts were correct. The patient was then awakened and taken to recovery in stable condition.  PLAN OF CARE: Discharge to home after PACU  PATIENT DISPOSITION:  PACU - hemodynamically stable.   Delay start of Pharmacological VTE agent (>24hrs) due to surgical blood loss or risk of bleeding: not applicable

## 2016-03-29 NOTE — Anesthesia Postprocedure Evaluation (Signed)
Anesthesia Post Note  Patient: Kristy Olson  Procedure(s) Performed: Procedure(s) (LRB): LEFT BREAST LUMPECTOMY WITH RADIOACTIVE SEED LOCALIZATION (Left)  Patient location during evaluation: PACU Anesthesia Type: General Level of consciousness: awake and alert Pain management: pain level controlled Vital Signs Assessment: post-procedure vital signs reviewed and stable Respiratory status: spontaneous breathing, nonlabored ventilation, respiratory function stable and patient connected to nasal cannula oxygen Cardiovascular status: blood pressure returned to baseline and stable Postop Assessment: no signs of nausea or vomiting Anesthetic complications: no    Last Vitals:  Vitals:   03/29/16 1145 03/29/16 1255  BP: 136/62 136/62  Pulse: 73 64  Resp: 17 16  Temp:  36.6 C    Last Pain:  Vitals:   03/29/16 1255  TempSrc: Oral  PainSc: 0-No pain                 Effie Berkshire

## 2016-03-29 NOTE — Transfer of Care (Signed)
Immediate Anesthesia Transfer of Care Note  Patient: Kristy Olson  Procedure(s) Performed: Procedure(s) with comments: LEFT BREAST LUMPECTOMY WITH RADIOACTIVE SEED LOCALIZATION (Left) - LEFT BREAST LUMPECTOMY WITH RADIOACTIVE SEED LOCALIZATION  Patient Location: PACU  Anesthesia Type:General  Level of Consciousness: awake, sedated and patient cooperative  Airway & Oxygen Therapy: Patient Spontanous Breathing and Patient connected to face mask oxygen  Post-op Assessment: Report given to RN and Post -op Vital signs reviewed and stable  Post vital signs: Reviewed and stable  Last Vitals:  Vitals:   03/29/16 0931  BP: (!) 115/54  Pulse: (!) 57  Resp: 18  Temp: 36.7 C    Last Pain:  Vitals:   03/29/16 0931  TempSrc: Oral         Complications: No apparent anesthesia complications

## 2016-03-29 NOTE — H&P (Signed)
Kristy Olson  Location: [facility] Patient #: [id number] DOB: [birth year] Widowed / Language: Cleophus Molt / Race: White Female   History of Present Illness The patient is a 82 year old female who presents for a follow-up for Breast cancer. The patient is an 82 year old white female who has a known cancer in the upper outer quadrant of the left breast. This cancer is ER and PR positive and HER-2 negative with a Ki-67 of 5%. Because of her comorbid condition she initially chose to treat this with neoadjuvant antiestrogen therapy. Repeat imaging has shown no change in size of the cancer. At this point her oncologist would prefer to have the primary cancer removed.   Allergies  Amoxicillin *PENICILLINS* Ciprofloxacin *FLUOROQUINOLONES* Doxycycline Hyclate *TETRACYCLINES* LevoFLOXacin *FLUOROQUINOLONES* Statins Support *DIETARY PRODUCTS/DIETARY MANAGEMENT PRODUCTS* No Known Drug Allergies04/25/2017 (Marked as Inactive)  Medication History  Fluorometholone (0.1% Suspension, Ophthalmic as needed) Active. No Current Medications (Taken starting 02/21/2016) Triamcinolone Acetonide (0.1% Ointment, External) Active. Triazolam (0.25MG Tablet, Oral) Active. Aspirin EC (81MG Tablet DR, Oral) Active. Calcium-Vitamin D (250-125MG-UNIT Tablet, Oral) Active. Krill Oil (1000MG Capsule, Oral) Active. Alendronate Sodium (70MG Tablet, Oral) Active. Letrozole (2.5MG Tablet, Oral) Active. Metoprolol Succinate ER (25MG Tablet ER 24HR, Oral) Active. Synthroid (137MCG Tablet, Oral) Active. Medications Reconciled    Review of Systems General Not Present- Appetite Loss, Chills, Fatigue, Fever, Night Sweats, Weight Gain and Weight Loss. HEENT Present- Nose Bleed, Ringing in the Ears and Visual Disturbances. Not Present- Earache, Hearing Loss, Hoarseness, Oral Ulcers, Seasonal Allergies, Sinus Pain, Sore Throat, Wears glasses/contact lenses and Yellow Eyes. Respiratory Not  Present- Bloody sputum, Chronic Cough, Difficulty Breathing, Snoring and Wheezing. Breast Present- Breast Mass. Not Present- Breast Pain, Nipple Discharge and Skin Changes. Female Genitourinary Not Present- Frequency, Nocturia, Painful Urination, Pelvic Pain and Urgency. Musculoskeletal Present- Back Pain. Not Present- Joint Pain, Joint Stiffness, Muscle Pain, Muscle Weakness and Swelling of Extremities. Neurological Present- Decreased Memory. Not Present- Fainting, Headaches, Numbness, Seizures, Tingling, Tremor, Trouble walking and Weakness. Psychiatric Present- Anxiety. Not Present- Bipolar, Change in Sleep Pattern, Depression, Fearful and Frequent crying. Endocrine Present- Cold Intolerance, Hair Changes and Hot flashes. Not Present- Excessive Hunger, Heat Intolerance and New Diabetes.  Vitals  Weight: 139 lb Height: 67in Body Surface Area: 1.73 m Body Mass Index: 21.77 kg/m  Temp.: 97.60F  Pulse: 76 (Regular)  BP: 124/78 (Sitting, Left Arm, Standard)       Physical Exam  General Mental Status-Alert. General Appearance-Consistent with stated age. Hydration-Well hydrated. Voice-Normal.  Head and Neck Head-normocephalic, atraumatic with no lesions or palpable masses. Trachea-midline. Thyroid Gland Characteristics - normal size and consistency.  Eye Eyeball - Bilateral-Extraocular movements intact. Sclera/Conjunctiva - Bilateral-No scleral icterus.  Chest and Lung Exam Chest and lung exam reveals -quiet, even and easy respiratory effort with no use of accessory muscles and on auscultation, normal breath sounds, no adventitious sounds and normal vocal resonance. Inspection Chest Wall - Normal. Back - normal.  Breast Note: There is no palpable mass in either breast. There is a small mobile palpable lymph node in the left axilla. Other than this there is no other palpable axillary, supraclavicular, or cervical  lymphadenopathy.   Cardiovascular Cardiovascular examination reveals -normal heart sounds, regular rate and rhythm with no murmurs and normal pedal pulses bilaterally.  Abdomen Inspection Inspection of the abdomen reveals - No Hernias. Skin - Scar - no surgical scars. Palpation/Percussion Palpation and Percussion of the abdomen reveal - Soft, Non Tender, No Rebound tenderness, No Rigidity (guarding) and No hepatosplenomegaly.  Auscultation Auscultation of the abdomen reveals - Bowel sounds normal.  Neurologic Neurologic evaluation reveals -alert and oriented x 3 with no impairment of recent or remote memory. Mental Status-Normal.  Musculoskeletal Normal Exam - Left-Upper Extremity Strength Normal and Lower Extremity Strength Normal. Normal Exam - Right-Upper Extremity Strength Normal and Lower Extremity Strength Normal.  Lymphatic Head & Neck  General Head & Neck Lymphatics: Bilateral - Description - Normal. Axillary  General Axillary Region: Bilateral - Description - Normal. Tenderness - Non Tender. Femoral & Inguinal  Generalized Femoral & Inguinal Lymphatics: Bilateral - Description - Normal. Tenderness - Non Tender.    Assessment & Plan BREAST CANCER OF UPPER-OUTER QUADRANT OF LEFT FEMALE BREAST (C50.412) Impression: The patient has a small cancer in the upper outer left breast. She has been treated with neoadjuvant antiestrogen medicine and it does not appear by imaging that the cancer has changed at all. For this reason her oncologist would like to have the primary cancer removed. I think this is a reasonable thing to do. I have discussed with her in detail the risks and benefits of the operation to remove this cancer from her left breast including some of the technical aspects and she understands and wishes to proceed. I will plan for a left breast radioactive seed localized lumpectomy

## 2016-03-30 ENCOUNTER — Encounter (HOSPITAL_BASED_OUTPATIENT_CLINIC_OR_DEPARTMENT_OTHER): Payer: Self-pay | Admitting: General Surgery

## 2016-04-17 ENCOUNTER — Ambulatory Visit (INDEPENDENT_AMBULATORY_CARE_PROVIDER_SITE_OTHER): Payer: Medicare Other | Admitting: Family Medicine

## 2016-04-17 ENCOUNTER — Encounter: Payer: Self-pay | Admitting: Family Medicine

## 2016-04-17 VITALS — BP 147/69 | HR 73 | Wt 134.0 lb

## 2016-04-17 DIAGNOSIS — Z23 Encounter for immunization: Secondary | ICD-10-CM | POA: Diagnosis not present

## 2016-04-17 DIAGNOSIS — I2510 Atherosclerotic heart disease of native coronary artery without angina pectoris: Secondary | ICD-10-CM

## 2016-04-17 DIAGNOSIS — L659 Nonscarring hair loss, unspecified: Secondary | ICD-10-CM | POA: Insufficient documentation

## 2016-04-17 DIAGNOSIS — H6120 Impacted cerumen, unspecified ear: Secondary | ICD-10-CM | POA: Diagnosis not present

## 2016-04-17 MED ORDER — METOPROLOL SUCCINATE ER 25 MG PO TB24: 25.0000 mg | ORAL_TABLET | Freq: Every day | ORAL | 0 refills | Status: DC

## 2016-04-17 NOTE — Progress Notes (Signed)
CC: Kristy Olson is a 82 y.o. female is here for Hypertension   Subjective: HPI:  Follow-up from an impaction: She is requesting a referral to [place] [facility]. I placed a referral for this however she tells me she was never notified, [place]  points out that she was given an appointment but never showed. She continues to have mild hearing loss bilaterally.  Her biggest complaint is hair loss involving the scalp. It's been gradual and not responding to over-the-counter daily Products. She's not tried Rogaine as of yet. It slowly worsening and is diffusely involving only the scalp. She denies any other hair or skin complaints.  Follow-up essential hypertension: She is currently only taking 12.5 mg metoprolol on a daily basis. No outside blood pressures to report. She has a history of coronary artery disease but no recent chest pain shortness of breath orthopnea or peripheral edema.   Review Of Systems Outlined In HPI  Past Medical History:  Diagnosis Date  . Abnormal finding on MRI of brain   . Breast cancer of upper-inner quadrant of left female breast ([place]) 10/12/2015  . CAD (coronary artery disease)   . Chronic venous insufficiency    Compression stockings, Dr. Carmine Savoy to evaluate late May 2015   . Degeneration of lumbar or lumbosacral intervertebral disc   . History of thyroid cancer    With reported brain mets.   . Hyperlipidemia   . Hypothyroid    Outside records report synthroid 160mcg despite her report of 121mcg. 02/2014 awaiting clarification   . Idiopathic scoliosis   . Osteoporosis    July 2014 DEXA   . PNEUMONIA, COMMUNITY ACQUIRED, PNEUMOCOCCAL   . Venous stasis ulcer ([place] [facility])     Past Surgical History:  Procedure Laterality Date  . BREAST LUMPECTOMY WITH RADIOACTIVE SEED LOCALIZATION Left 03/29/2016   Procedure: LEFT BREAST LUMPECTOMY WITH RADIOACTIVE SEED LOCALIZATION;  Surgeon: Autumn Messing III, MD;  Location: [facility];  Service: General;   Laterality: Left;  LEFT BREAST LUMPECTOMY WITH RADIOACTIVE SEED LOCALIZATION  . cranotomy    . THYROIDECTOMY     Family History  Problem Relation Age of Onset  . CAD Sister   . Cancer Sister   . Heart disease Sister   . Diabetes Father   . COPD Father     Social History   Social History  . Marital status: Widowed    Spouse name: N/A  . Number of children: 2  . Years of education: N/A   Occupational History  .  Retired   Social History Main Topics  . Smoking status: Never Smoker  . Smokeless tobacco: Never Used  . Alcohol use 0.0 oz/week     Comment: Occasional  . Drug use: No  . Sexual activity: Not on file   Other Topics Concern  . Not on file   Social History Narrative  . No narrative on file     Objective: BP (!) 147/69   Pulse 73   Wt 134 lb (60.8 kg)   BMI 20.99 kg/m   General: Alert and Oriented, No Acute Distress HEENT: Pupils equal, round, reactive to light. Conjunctivae clear.  Moist mucous membranes Lungs: Clear to auscultation bilaterally, no wheezing/ronchi/rales.  Comfortable work of breathing. Good air movement. Cardiac: Regular rate and rhythm. Normal S1/S2.  No murmurs, rubs, nor gallops.   Extremities: No peripheral edema.  Strong peripheral pulses.  Mental Status: No depression, anxiety, nor agitation. Skin: Warm and dry. Early female pattern baldness  Assessment & Plan: Kristy Olson was seen today for hypertension.  Diagnoses and all orders for this visit:  Needs flu shot -     Flu vaccine HIGH DOSE PF (Fluzone High dose)  Cerumen impaction, unspecified laterality  Hair loss -     Ambulatory referral to Dermatology  Coronary artery disease involving native coronary artery of native heart without angina pectoris -     metoprolol succinate (TOPROL-XL) 25 MG 24 hr tablet; Take 1 tablet (25 mg total) by mouth daily. Replaces half tab regimen.   Cerumen impaction: Referral again has been made to [place] in her Synthroid per her request.  She is not in just in having the removal done here Her loss: Referral to dermatology specifically [facility] Dr. Leonie Green try Rogaine in the meantime Coronary artery disease is controlled however hypertension is uncontrolled, increasing metoprolol continue aspirin  Follow-up the next 3 months. Discussed with this patient that I will be resigning from my position here with [facility] in September in order to stay with my family who will be moving to [facility]. I let him know about the providers that are still accepting patients and I feel that this individual will be under great care if he/she stays here with [facility].  No Follow-up on file.

## 2016-04-17 NOTE — Patient Instructions (Signed)
Over the counter Minoxidil

## 2016-05-08 ENCOUNTER — Other Ambulatory Visit (HOSPITAL_BASED_OUTPATIENT_CLINIC_OR_DEPARTMENT_OTHER): Payer: Medicare Other

## 2016-05-08 ENCOUNTER — Encounter: Payer: Self-pay | Admitting: Hematology & Oncology

## 2016-05-08 ENCOUNTER — Ambulatory Visit (HOSPITAL_BASED_OUTPATIENT_CLINIC_OR_DEPARTMENT_OTHER): Payer: Medicare Other | Admitting: Hematology & Oncology

## 2016-05-08 DIAGNOSIS — C50912 Malignant neoplasm of unspecified site of left female breast: Secondary | ICD-10-CM

## 2016-05-08 DIAGNOSIS — N6092 Unspecified benign mammary dysplasia of left breast: Secondary | ICD-10-CM

## 2016-05-08 DIAGNOSIS — M810 Age-related osteoporosis without current pathological fracture: Secondary | ICD-10-CM | POA: Diagnosis not present

## 2016-05-08 DIAGNOSIS — C50112 Malignant neoplasm of central portion of left female breast: Secondary | ICD-10-CM

## 2016-05-08 DIAGNOSIS — C50212 Malignant neoplasm of upper-inner quadrant of left female breast: Secondary | ICD-10-CM

## 2016-05-08 DIAGNOSIS — C73 Malignant neoplasm of thyroid gland: Secondary | ICD-10-CM

## 2016-05-08 LAB — CBC WITH DIFFERENTIAL (CANCER CENTER ONLY)
BASO#: 0 10*3/uL (ref 0.0–0.2)
BASO%: 0.3 % (ref 0.0–2.0)
EOS ABS: 0.2 10*3/uL (ref 0.0–0.5)
EOS%: 2.7 % (ref 0.0–7.0)
HEMATOCRIT: 37.6 % (ref 34.8–46.6)
HEMOGLOBIN: 12.5 g/dL (ref 11.6–15.9)
LYMPH#: 2 10*3/uL (ref 0.9–3.3)
LYMPH%: 32.6 % (ref 14.0–48.0)
MCH: 30.6 pg (ref 26.0–34.0)
MCHC: 33.2 g/dL (ref 32.0–36.0)
MCV: 92 fL (ref 81–101)
MONO#: 0.6 10*3/uL (ref 0.1–0.9)
MONO%: 9.5 % (ref 0.0–13.0)
NEUT%: 54.9 % (ref 39.6–80.0)
NEUTROS ABS: 3.3 10*3/uL (ref 1.5–6.5)
Platelets: 175 10*3/uL (ref 145–400)
RBC: 4.08 10*6/uL (ref 3.70–5.32)
RDW: 13 % (ref 11.1–15.7)
WBC: 6 10*3/uL (ref 3.9–10.0)

## 2016-05-08 LAB — COMPREHENSIVE METABOLIC PANEL
ALBUMIN: 3.7 g/dL (ref 3.5–5.0)
ALK PHOS: 75 U/L (ref 40–150)
ALT: 13 U/L (ref 0–55)
ANION GAP: 10 meq/L (ref 3–11)
AST: 20 U/L (ref 5–34)
BILIRUBIN TOTAL: 0.53 mg/dL (ref 0.20–1.20)
BUN: 27.5 mg/dL — ABNORMAL HIGH (ref 7.0–26.0)
CALCIUM: 9.3 mg/dL (ref 8.4–10.4)
CHLORIDE: 105 meq/L (ref 98–109)
CO2: 28 mEq/L (ref 22–29)
Creatinine: 1 mg/dL (ref 0.6–1.1)
EGFR: 54 mL/min/{1.73_m2} — AB (ref >90)
Glucose: 83 mg/dl (ref 70–140)
POTASSIUM: 4.3 meq/L (ref 3.5–5.1)
Sodium: 143 mEq/L (ref 136–145)
Total Protein: 7.1 g/dL (ref 6.4–8.3)

## 2016-05-08 MED ORDER — LETROZOLE 2.5 MG PO TABS: 2.5000 mg | ORAL_TABLET | Freq: Every day | ORAL | 4 refills | Status: DC

## 2016-05-08 NOTE — Progress Notes (Signed)
Hematology and Oncology Follow Up Visit  Kristy Olson [id number] 27-Nov-1931 82 y.o. 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 She did have a lumpectomy. This is about a month ago. She had this on October 9. The pathology report ([id number]) showed a well-differentiated tubulo-lobular carcinoma. This measures 0.9 cm. There is no lymphovascular space invasion.  The tumor was ER positive/PR positive/HER-2 negative.  She had no problems with the surgery. She is filled up nicely.  She is on Femara right now. She is tolerating this pretty well. She says that some of her hair is coming out. I suppose this might be from the Femara.  Of note, with her path report, she was found to have atypical lobular hyperplasia and a second location. As such, I think Femara would be a good idea for her.  Given her age, I'm not sure if she would need any radiation therapy. This is a small tumor. Granted, one of the margins is pretty close. I'll speak with radiation oncology about this.  Otherwise, she is feeling pretty well.  Her overall performance status is ECOG 1. Medications:  Current Outpatient Prescriptions:  .  alendronate (FOSAMAX) 70 MG tablet, TAKE 1 TABLET BY MOUTH EVERY 7 DAYS WITH A FULL GLASS OF WATER AND ON AN EMPTY STOMACH, Disp: 12 tablet, Rfl: 0 .  aspirin 81 MG tablet, Take 81 mg by mouth daily., Disp: , Rfl:  .  cholecalciferol (VITAMIN D) 1000 units tablet, Take 1,000 Units by mouth daily. Reported on 01/05/2016, Disp: , Rfl:  .  HYDROcodone-acetaminophen (NORCO) 5-325 MG tablet, Take 1-2 tablets by mouth every 6 (six) hours as needed., Disp: 20 tablet, Rfl: 0 .  letrozole (FEMARA) 2.5 MG tablet, Take 1 tablet (2.5 mg total) by mouth daily., Disp: 90 tablet, Rfl: 4 .   levothyroxine (SYNTHROID, LEVOTHROID) 150 MCG tablet, Take 1 tablet (150 mcg total) by mouth daily., Disp: 90 tablet, Rfl: 3 .  metoprolol succinate (TOPROL-XL) 25 MG 24 hr tablet, Take 1 tablet (25 mg total) by mouth daily. Replaces half tab regimen., Disp: 90 tablet, Rfl: 0 .  Red Yeast Rice 600 MG TABS, Four by mouth daily to help lower cholesterol, Disp: 120 tablet, Rfl: 11 .  triamcinolone cream (KENALOG) 0.1 %, Apply 1 application topically 2 (two) times daily., Disp: 30 g, Rfl: 0 .  zoster vaccine live, PF, (ZOSTAVAX) 99833 UNT/0.65ML injection, Inject 19,400 Units into the skin once., Disp: 1 each, Rfl: 0  Allergies:  Allergies  Allergen Reactions  . Levaquin  [Levofloxacin In D5w] Other (See Comments)  . Amoxicillin   . Ciprofloxacin   . Colesevelam   . Doxycycline   . Levofloxacin   . Neomycin     Allergic reaction only topically.  . Other Itching    curad triple antibiotic ointment.  . Statins     Hives  . Tape     Past Medical History, Surgical history, Social history, and Family History were reviewed and updated.  Review of Systems:  As above  Physical Exam:  height is _0  (1.702 m) and weight is 133 lb 6.4 oz (60.5 kg). Her oral temperature is 98.2 F (36.8 C). Her blood pressure is 133/47 (abnormal) and her pulse is 59 (abnormal). Her respiration is  18.   Wt Readings from Last 3 Encounters:  05/08/16 133 lb 6.4 oz (60.5 kg)  04/17/16 134 lb (60.8 kg)  03/29/16 137 lb (62.1 kg)      Well-developed and well-nourished white female in no obvious distress. Head and neck exam shows no ocular or oral lesions. There are no palpable cervical or supraclavicular lymph nodes. Lungs are clear. Cardiac exam regular rate and rhythm with no murmurs, rubs or bruits. Breast exam shows right breast with no masses, edema or erythema. There is no right axillary adenopathy. Left breast shows a lumpectomy at the 2:00 position. This is healing nicely. There is no erythema or warmth  at the lumpectomy site. There is no left axillary adenopathy.   abdominal exam shows no fluid wave. There is no abdominal mass. There is no palpable liver or spleen tip. Externally shows no clubbing, cyanosis or edema. Back exam shows some slight kyphosis. Skin exam shows no rashes, ecchymoses or petechia. Lab Results  Component Value Date   WBC 6.0 05/08/2016   HGB 12.5 05/08/2016   HCT 37.6 05/08/2016   MCV 92 05/08/2016   PLT 175 05/08/2016     Chemistry      Component Value Date/Time   NA 142 02/07/2016 1153   K 4.6 02/07/2016 1153   CL 103 11/22/2015 1157   CO2 26 02/07/2016 1153   BUN 27.2 (H) 02/07/2016 1153   CREATININE 1.0 02/07/2016 1153      Component Value Date/Time   CALCIUM 9.1 02/07/2016 1153   ALKPHOS 61 02/07/2016 1153   AST 20 02/07/2016 1153   ALT 15 02/07/2016 1153   BILITOT 0.52 02/07/2016 1153         Impression and Plan: Kristy Olson is  A 82 year old white female. She has a stage I tubular lobular carcinoma of the left breast. She underwent lumpectomy.  Again, I'm not sure if she needs radiation therapy. Given her age, the small size of the tumor, and the fact this is a better histological tumor type, I'm not sure radiation would offer substantial benefit. However, I will speak with one of the radiation oncologist about this.  Given that she does have atypical lobular hyperplasia, I suspect that the Femara will be a good idea for her.  We will have to watch out for any issues with osteoporosis from the Femara.  Unfortunately her family doctor has left town. I'm not sure if she will be able to get another one.  I would like to see her back in 2 months.  I spent about 30 minutes with her today. I went over the pathology report    Volanda Napoleon, MD 9/18/201712:01 PM

## 2016-05-11 ENCOUNTER — Telehealth: Payer: Self-pay | Admitting: Endocrinology

## 2016-05-11 NOTE — Telephone Encounter (Signed)
Patient stated that it is time for to have a MRI, please advise

## 2016-05-12 NOTE — Telephone Encounter (Signed)
Ov is due.  Let's address then 

## 2016-05-12 NOTE — Telephone Encounter (Signed)
I contacted the patient and left a voicemail advising of message from Dr. [name] for her to call back and schedule an a office visit before we can order the MRI.

## 2016-05-12 NOTE — Telephone Encounter (Signed)
See message and please advise, Thanks!  

## 2016-06-26 ENCOUNTER — Other Ambulatory Visit: Payer: Self-pay | Admitting: Osteopathic Medicine

## 2016-06-26 ENCOUNTER — Encounter: Payer: Self-pay | Admitting: Osteopathic Medicine

## 2016-06-26 DIAGNOSIS — H16121 Filamentary keratitis, right eye: Secondary | ICD-10-CM | POA: Insufficient documentation

## 2016-06-26 DIAGNOSIS — H01003 Unspecified blepharitis right eye, unspecified eyelid: Secondary | ICD-10-CM | POA: Insufficient documentation

## 2016-06-26 DIAGNOSIS — H01006 Unspecified blepharitis left eye, unspecified eyelid: Secondary | ICD-10-CM

## 2016-06-26 DIAGNOSIS — H1013 Acute atopic conjunctivitis, bilateral: Secondary | ICD-10-CM | POA: Insufficient documentation

## 2016-06-26 DIAGNOSIS — H16223 Keratoconjunctivitis sicca, not specified as Sjogren's, bilateral: Secondary | ICD-10-CM | POA: Insufficient documentation

## 2016-06-26 MED ORDER — LIFITEGRAST 5 % OP SOLN: 1.0000 [drp] | Freq: Two times a day (BID) | OPHTHALMIC | 0 refills | Status: DC

## 2016-06-26 MED ORDER — POLYETHYL GLYCOL-PROPYL GLYCOL 0.4-0.3 % OP GEL: 1.0000 "application " | Freq: Four times a day (QID) | OPHTHALMIC | 0 refills | Status: DC | PRN

## 2016-06-26 NOTE — Progress Notes (Signed)
Updated medications based on most recent note from ophthalmology

## 2016-07-05 DIAGNOSIS — L97322 Non-pressure chronic ulcer of left ankle with fat layer exposed: Secondary | ICD-10-CM | POA: Insufficient documentation

## 2016-07-11 ENCOUNTER — Ambulatory Visit (INDEPENDENT_AMBULATORY_CARE_PROVIDER_SITE_OTHER): Payer: Medicare Other | Admitting: Osteopathic Medicine

## 2016-07-11 VITALS — BP 146/61 | HR 70 | Ht 67.0 in | Wt 130.0 lb

## 2016-07-11 DIAGNOSIS — I83029 Varicose veins of left lower extremity with ulcer of unspecified site: Secondary | ICD-10-CM

## 2016-07-11 DIAGNOSIS — L97929 Non-pressure chronic ulcer of unspecified part of left lower leg with unspecified severity: Secondary | ICD-10-CM

## 2016-07-11 MED ORDER — HYDROCODONE-ACETAMINOPHEN 5-325 MG PO TABS: 1.0000 | ORAL_TABLET | Freq: Four times a day (QID) | ORAL | 0 refills | Status: DC | PRN

## 2016-07-11 MED ORDER — GABAPENTIN 100 MG PO CAPS
100.0000 mg | ORAL_CAPSULE | Freq: Three times a day (TID) | ORAL | 1 refills | Status: DC
Start: 2016-07-11 — End: 2017-04-11

## 2016-07-11 NOTE — Patient Instructions (Addendum)
Ask if topical Lidocaine can be used for wound pain if you change dressings more frequently.  DO NOT use the Hydrocodone and the Gabapentin together.

## 2016-07-11 NOTE — Progress Notes (Signed)
HPI: Kristy Olson is a 82 y.o. female  who presents to [facility] today, 07/11/16,  for chief complaint of:  Chief Complaint  Patient presents with  . Rash    on ankle    Wound pain . Context: Following with wound care for venous stasis ulcer, patient is complaining of shallow burning/stinging pain, was told by wound care to come to PCP for pain medication . Location: Left lower extremity . Quality: Stinging/burning . Timing: constant . Modifying factors: Was told she could take Tylenol but she hasn't tried this . Assoc signs/symptoms: No lower extremity swelling, no shortness of breath, no falling    Past medical, surgical, social and family history reviewed: Patient Active Problem List   Diagnosis Date Noted  . Filamentary keratitis of right eye 06/26/2016  . Keratoconjunctivitis sicca due to decreased tear production, bilateral 06/26/2016  . Blepharitis of both eyes 06/26/2016  . Atopic conjunctivitis of both eyes 06/26/2016  . Cerumen impaction 04/17/2016  . Hair loss 04/17/2016  . Breast cancer of upper-inner quadrant of left female breast ([place]) 10/12/2015  . Rhinorrhea 12/15/2014  . Chest pain 12/02/2014  . Hypothyroidism, postsurgical 07/21/2014  . CAD (coronary artery disease) 04/29/2014  . Abnormal finding on MRI of brain 04/14/2014  . Malignant neoplasm of thyroid gland ([place]) 04/14/2014  . History of thyroid cancer 03/27/2014  . Chronic venous insufficiency 12/23/2013  . Degeneration of lumbar or lumbosacral intervertebral disc 10/08/2013  . Idiopathic scoliosis 10/08/2013  . Osteoporosis 10/06/2013  . Essential hypertension, benign 08/18/2013  . Venous stasis ulcer ([place]) 08/06/2013  . Grief 08/06/2013  . Hyperlipidemia 08/15/2010   Past Surgical History:  Procedure Laterality Date  . BREAST LUMPECTOMY WITH RADIOACTIVE SEED LOCALIZATION Left 03/29/2016   Procedure: LEFT BREAST LUMPECTOMY WITH RADIOACTIVE SEED  LOCALIZATION;  Surgeon: Autumn Messing III, MD;  Location: [facility];  Service: General;  Laterality: Left;  LEFT BREAST LUMPECTOMY WITH RADIOACTIVE SEED LOCALIZATION  . cranotomy    . THYROIDECTOMY     Social History  Substance Use Topics  . Smoking status: Never Smoker  . Smokeless tobacco: Never Used  . Alcohol use 0.0 oz/week     Comment: Occasional   Family History  Problem Relation Age of Onset  . CAD Sister   . Cancer Sister   . Heart disease Sister   . Diabetes Father   . COPD Father      Current medication list and allergy/intolerance information reviewed:   Current Outpatient Prescriptions on File Prior to Visit  Medication Sig Dispense Refill  . alendronate (FOSAMAX) 70 MG tablet TAKE 1 TABLET BY MOUTH EVERY 7 DAYS WITH A FULL GLASS OF WATER AND ON AN EMPTY STOMACH 12 tablet 0  . aspirin 81 MG tablet Take 81 mg by mouth daily.    . cholecalciferol (VITAMIN D) 1000 units tablet Take 1,000 Units by mouth daily. Reported on 01/05/2016    . HYDROcodone-acetaminophen (NORCO) 5-325 MG tablet Take 1-2 tablets by mouth every 6 (six) hours as needed. 20 tablet 0  . letrozole (FEMARA) 2.5 MG tablet Take 1 tablet (2.5 mg total) by mouth daily. 90 tablet 4  . levothyroxine (SYNTHROID, LEVOTHROID) 150 MCG tablet Take 1 tablet (150 mcg total) by mouth daily. 90 tablet 3  . Lifitegrast (XIIDRA) 5 % SOLN Apply 1 drop to eye 2 (two) times daily. 1 each 0  . metoprolol succinate (TOPROL-XL) 25 MG 24 hr tablet Take 1 tablet (25 mg total) by mouth  daily. Replaces half tab regimen. 90 tablet 0  . Polyethyl Glycol-Propyl Glycol (SYSTANE) 0.4-0.3 % GEL ophthalmic gel Place 1 application into both eyes 4 (four) times daily as needed. 1 Bottle 0  . Red Yeast Rice 600 MG TABS Four by mouth daily to help lower cholesterol 120 tablet 11  . triamcinolone cream (KENALOG) 0.1 % Apply 1 application topically 2 (two) times daily. 30 g 0  . zoster vaccine live, PF, (ZOSTAVAX) 13086 UNT/0.65ML  injection Inject 19,400 Units into the skin once. 1 each 0   No current facility-administered medications on file prior to visit.    Allergies  Allergen Reactions  . Levaquin  [Levofloxacin In D5w] Other (See Comments)  . Amoxicillin   . Ciprofloxacin   . Colesevelam   . Doxycycline   . Levofloxacin   . Neomycin     Allergic reaction only topically.  . Other Itching    curad triple antibiotic ointment.  . Statins     Hives  . Tape       Review of Systems:  Constitutional: No recent illness  HEENT: No  headache, no vision change  Cardiac: No  chest pain, No  pressure, No palpitations  Respiratory:  No  shortness of breath. No  Cough  Gastrointestinal: No  abdominal pain, no change on bowel habits  Musculoskeletal: No new myalgia/arthralgia  Skin: No  Rash  Hem/Onc: No  easy bruising/bleeding, No  abnormal lumps/bumps  Neurologic: No  weakness, No  Dizziness  Psychiatric: No  concerns with depression, No  concerns with anxiety  Exam:  BP (!) 146/61   Pulse 70   Ht 5\' 7"  (1.702 m)   Wt 130 lb (59 kg)   BMI 20.36 kg/m   Constitutional: VS see above. General Appearance: alert, well-developed, well-nourished, NAD  Neck: No masses, trachea midline.   Respiratory: Normal respiratory effort.  Cardiovascular: No LE edema, Homan's neg bilaterally   Musculoskeletal: Gait normal. Symmetric and independent movement of all extremities  Skin: ulcer L medial leg, serosanguinous frainage w/o erythema, granulation tissue present, skin dry  Neurological: Normal balance/coordination. No tremor.  Skin: warm, dry, intact.   Psychiatric: Normal judgment/insight. Normal mood and affect. Oriented x3.      ASSESSMENT/PLAN:   Pain control as below, lowest effective dose advised, can increase gabapentin if needed  Venous stasis ulcer of left lower extremity (HCC) - Plan: HYDROcodone-acetaminophen (NORCO) 5-325 MG tablet, gabapentin (NEURONTIN) 100 MG capsule     Patient Instructions  Ask if topical Lidocaine can be used for wound pain if you change dressings more frequently.  DO NOT use the Hydrocodone and the Gabapentin together.      Visit summary with medication list and pertinent instructions was printed for patient to review. All questions at time of visit were answered - patient instructed to contact office with any additional concerns. ER/RTC precautions were reviewed with the patient. Follow-up plan: keep current appointment or let us know if need seen sooner

## 2016-07-17 ENCOUNTER — Ambulatory Visit: Payer: Self-pay | Admitting: Osteopathic Medicine

## 2016-07-17 ENCOUNTER — Ambulatory Visit (HOSPITAL_BASED_OUTPATIENT_CLINIC_OR_DEPARTMENT_OTHER): Payer: Medicare Other | Admitting: Hematology & Oncology

## 2016-07-17 ENCOUNTER — Other Ambulatory Visit (HOSPITAL_BASED_OUTPATIENT_CLINIC_OR_DEPARTMENT_OTHER): Payer: Medicare Other

## 2016-07-17 DIAGNOSIS — C50912 Malignant neoplasm of unspecified site of left female breast: Secondary | ICD-10-CM

## 2016-07-17 DIAGNOSIS — L97829 Non-pressure chronic ulcer of other part of left lower leg with unspecified severity: Secondary | ICD-10-CM | POA: Diagnosis not present

## 2016-07-17 DIAGNOSIS — C50112 Malignant neoplasm of central portion of left female breast: Secondary | ICD-10-CM

## 2016-07-17 DIAGNOSIS — C73 Malignant neoplasm of thyroid gland: Secondary | ICD-10-CM

## 2016-07-17 LAB — CBC WITH DIFFERENTIAL (CANCER CENTER ONLY)
BASO#: 0 10*3/uL (ref 0.0–0.2)
BASO%: 0.3 % (ref 0.0–2.0)
EOS ABS: 0.2 10*3/uL (ref 0.0–0.5)
EOS%: 3.8 % (ref 0.0–7.0)
HCT: 35.3 % (ref 34.8–46.6)
HGB: 11.5 g/dL — ABNORMAL LOW (ref 11.6–15.9)
LYMPH#: 1.6 10*3/uL (ref 0.9–3.3)
LYMPH%: 25.4 % (ref 14.0–48.0)
MCH: 30.2 pg (ref 26.0–34.0)
MCHC: 32.6 g/dL (ref 32.0–36.0)
MCV: 93 fL (ref 81–101)
MONO#: 0.5 10*3/uL (ref 0.1–0.9)
MONO%: 8.5 % (ref 0.0–13.0)
NEUT#: 3.9 10*3/uL (ref 1.5–6.5)
NEUT%: 62 % (ref 39.6–80.0)
PLATELETS: 187 10*3/uL (ref 145–400)
RBC: 3.81 10*6/uL (ref 3.70–5.32)
RDW: 13.1 % (ref 11.1–15.7)
WBC: 6.4 10*3/uL (ref 3.9–10.0)

## 2016-07-17 LAB — CMP (CANCER CENTER ONLY)
ALBUMIN: 3.4 g/dL (ref 3.3–5.5)
ALT(SGPT): 20 U/L (ref 10–47)
AST: 30 U/L (ref 11–38)
Alkaline Phosphatase: 68 U/L (ref 26–84)
BUN: 22 mg/dL (ref 7–22)
CALCIUM: 9.4 mg/dL (ref 8.0–10.3)
CHLORIDE: 103 meq/L (ref 98–108)
CO2: 29 mEq/L (ref 18–33)
Creat: 0.8 mg/dl (ref 0.6–1.2)
Glucose, Bld: 84 mg/dL (ref 73–118)
POTASSIUM: 4.3 meq/L (ref 3.3–4.7)
Sodium: 145 mEq/L (ref 128–145)
TOTAL PROTEIN: 6.7 g/dL (ref 6.4–8.1)
Total Bilirubin: 0.6 mg/dl (ref 0.20–1.60)

## 2016-07-17 MED ORDER — LETROZOLE 2.5 MG PO TABS: 2.5000 mg | ORAL_TABLET | Freq: Every day | ORAL | 4 refills | Status: DC

## 2016-07-17 NOTE — Progress Notes (Signed)
Hematology and Oncology Follow Up Visit  Analy Olson [id number] 1932/06/18 82 y.o. 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 Her big problem is that she has an ulcer on the medial aspect of the ankle of her left leg. She sees a doctor at the wound clinic. She is putting some dressing on this. I'm sure this is some and that we'll need surgery.  She is doing well with the Femara. She's having no problems with Femara. She's not having any arthralgias. She is on vitamin D which she takes daily.  She had a very nice Thanksgiving. She was with her family.  She's had no problems with cough or shortness of breath. She has had no nausea or vomiting. She's had no rashes. She's had no sweats. She's had no fever.  Her overall performance status is ECOG 1. Medications:  Current Outpatient Prescriptions:  .  alendronate (FOSAMAX) 70 MG tablet, TAKE 1 TABLET BY MOUTH EVERY 7 DAYS WITH A FULL GLASS OF WATER AND ON AN EMPTY STOMACH, Disp: 12 tablet, Rfl: 0 .  aspirin 81 MG tablet, Take 81 mg by mouth daily., Disp: , Rfl:  .  cholecalciferol (VITAMIN D) 1000 units tablet, Take 1,000 Units by mouth daily. Reported on 01/05/2016, Disp: , Rfl:  .  gabapentin (NEURONTIN) 100 MG capsule, Take 1-3 capsules (100-300 mg total) by mouth 3 (three) times daily., Disp: 60 capsule, Rfl: 1 .  HYDROcodone-acetaminophen (NORCO) 5-325 MG tablet, Take 1-2 tablets by mouth every 6 (six) hours as needed., Disp: 20 tablet, Rfl: 0 .  letrozole (FEMARA) 2.5 MG tablet, Take 1 tablet (2.5 mg total) by mouth daily., Disp: 90 tablet, Rfl: 4 .  levothyroxine (SYNTHROID, LEVOTHROID) 150 MCG tablet, Take 1 tablet (150 mcg total) by mouth daily., Disp: 90 tablet, Rfl: 3 .  Lifitegrast (XIIDRA) 5 % SOLN, Apply 1  drop to eye 2 (two) times daily., Disp: 1 each, Rfl: 0 .  metoprolol succinate (TOPROL-XL) 25 MG 24 hr tablet, Take 1 tablet (25 mg total) by mouth daily. Replaces half tab regimen., Disp: 90 tablet, Rfl: 0 .  Polyethyl Glycol-Propyl Glycol (SYSTANE) 0.4-0.3 % GEL ophthalmic gel, Place 1 application into both eyes 4 (four) times daily as needed., Disp: 1 Bottle, Rfl: 0 .  Red Yeast Rice 600 MG TABS, Four by mouth daily to help lower cholesterol, Disp: 120 tablet, Rfl: 11 .  triamcinolone cream (KENALOG) 0.1 %, Apply 1 application topically 2 (two) times daily., Disp: 30 g, Rfl: 0 .  zoster vaccine live, PF, (ZOSTAVAX) 40981 UNT/0.65ML injection, Inject 19,400 Units into the skin once., Disp: 1 each, Rfl: 0  Allergies:  Allergies  Allergen Reactions  . Levaquin  [Levofloxacin In D5w] Other (See Comments)  . Amoxicillin   . Ciprofloxacin   . Colesevelam   . Doxycycline   . Levofloxacin   . Neomycin     Allergic reaction only topically.  . Other Itching    curad triple antibiotic ointment.  . Statins     Hives  . Tape     Past Medical History, Surgical history, Social history, and Family History were reviewed and updated.  Review of Systems:  As above  Physical Exam:  weight is 134 lb 12.8 oz (61.1 kg). Her oral  temperature is 97.6 F (36.4 C). Her blood pressure is 137/45 (abnormal) and her pulse is 67. Her respiration is 18 and oxygen saturation is 97%.   Wt Readings from Last 3 Encounters:  07/17/16 134 lb 12.8 oz (61.1 kg)  07/11/16 130 lb (59 kg)  05/08/16 133 lb 6.4 oz (60.5 kg)      Well-developed and well-nourished white female in no obvious distress. Head and neck exam shows no ocular or oral lesions. There are no palpable cervical or supraclavicular lymph nodes. Lungs are clear. Cardiac exam regular rate and rhythm with no murmurs, rubs or bruits. Breast exam shows right breast with no masses, edema or erythema. There is no right axillary adenopathy. Left breast shows  a lumpectomy at the 2:00 position. This is healing nicely. There is no erythema or warmth at the lumpectomy site. There is no left axillary adenopathy.   abdominal exam shows no fluid wave. There is no abdominal mass. There is no palpable liver or spleen tip. Externally shows no clubbing, cyanosis or edema. She does have a wrap on the left ankle. Back exam shows some slight kyphosis. Skin exam shows no rashes, ecchymoses or petechia.  Lab Results  Component Value Date   WBC 6.4 07/17/2016   HGB 11.5 (L) 07/17/2016   HCT 35.3 07/17/2016   MCV 93 07/17/2016   PLT 187 07/17/2016     Chemistry      Component Value Date/Time   NA 145 07/17/2016 1159   NA 143 05/08/2016 1103   K 4.3 07/17/2016 1159   K 4.3 05/08/2016 1103   CL 103 07/17/2016 1159   CO2 29 07/17/2016 1159   CO2 28 05/08/2016 1103   BUN 22 07/17/2016 1159   BUN 27.5 (H) 05/08/2016 1103   CREATININE 0.8 07/17/2016 1159   CREATININE 1.0 05/08/2016 1103      Component Value Date/Time   CALCIUM 9.4 07/17/2016 1159   CALCIUM 9.3 05/08/2016 1103   ALKPHOS 68 07/17/2016 1159   ALKPHOS 75 05/08/2016 1103   AST 30 07/17/2016 1159   AST 20 05/08/2016 1103   ALT 20 07/17/2016 1159   ALT 13 05/08/2016 1103   BILITOT 0.60 07/17/2016 1159   BILITOT 0.53 05/08/2016 1103         Impression and Plan: Kristy Olson is  A 82 year old white female. She has a stage I tubular lobular carcinoma of the left breast. She underwent lumpectomy.  I think her biggest problem is this ulcer on the inside of her left lower leg. Hope, this will be able to be treated. She goes to the wound clinic for this.   I will see any problems with her being on the Femara. I think she is doing well with Femara.  We will plan to get her back in another 4 months now. I think we get her through the holidays and wintertime.    Volanda Napoleon, MD 11/27/20171:00 PM Kristy Olson is a

## 2016-07-18 ENCOUNTER — Encounter: Payer: Self-pay | Admitting: Osteopathic Medicine

## 2016-07-19 ENCOUNTER — Encounter: Payer: Self-pay | Admitting: Osteopathic Medicine

## 2016-07-19 DIAGNOSIS — R7989 Other specified abnormal findings of blood chemistry: Secondary | ICD-10-CM | POA: Insufficient documentation

## 2016-07-19 DIAGNOSIS — L650 Telogen effluvium: Secondary | ICD-10-CM | POA: Insufficient documentation

## 2016-07-21 ENCOUNTER — Ambulatory Visit: Payer: Self-pay | Admitting: Osteopathic Medicine

## 2016-07-25 ENCOUNTER — Encounter: Payer: Self-pay | Admitting: Osteopathic Medicine

## 2016-07-25 ENCOUNTER — Ambulatory Visit (INDEPENDENT_AMBULATORY_CARE_PROVIDER_SITE_OTHER): Payer: Medicare Other | Admitting: Osteopathic Medicine

## 2016-07-25 VITALS — BP 117/73 | HR 64 | Wt 129.0 lb

## 2016-07-25 DIAGNOSIS — C73 Malignant neoplasm of thyroid gland: Secondary | ICD-10-CM | POA: Diagnosis not present

## 2016-07-25 DIAGNOSIS — L97929 Non-pressure chronic ulcer of unspecified part of left lower leg with unspecified severity: Secondary | ICD-10-CM

## 2016-07-25 DIAGNOSIS — I2510 Atherosclerotic heart disease of native coronary artery without angina pectoris: Secondary | ICD-10-CM

## 2016-07-25 DIAGNOSIS — I10 Essential (primary) hypertension: Secondary | ICD-10-CM

## 2016-07-25 DIAGNOSIS — E785 Hyperlipidemia, unspecified: Secondary | ICD-10-CM

## 2016-07-25 DIAGNOSIS — M810 Age-related osteoporosis without current pathological fracture: Secondary | ICD-10-CM

## 2016-07-25 DIAGNOSIS — C50212 Malignant neoplasm of upper-inner quadrant of left female breast: Secondary | ICD-10-CM

## 2016-07-25 DIAGNOSIS — I83029 Varicose veins of left lower extremity with ulcer of unspecified site: Secondary | ICD-10-CM

## 2016-07-25 NOTE — Progress Notes (Signed)
HPI: Kristy Olson is a 82 y.o. female  who presents to [name] today, 07/25/16,  for chief complaint of:  Chief Complaint  Patient presents with  . Establish Care    switching from Kristy Olson:   S/p lumpectomy 03/2016 and following with [name] (oncology).   Hx thyroid cancer 2005, following with Dr [name] [facility]) s/p thyroidectomy. Hx brain mets. S/p radiation x2 rounds. Recurrence in 2008, s/p craniotomy and radiation. Residual dry eye and hearing problems R side. On Synthroid. Has been some time since TSH checked.   SKIN  Following with wound care for venous stasis ulcer. Gabapentin was not started, she is occasionally using the hydrocodone (she thinks - she isn't sure, it's a white oval pill).   MUSCULOSKELETAL  On Fosamax for osteoporosis  CARDIAC  Hyperlipidemia - decline statin     Past medical, surgical, social and family history reviewed: Patient Active Problem List   Diagnosis Date Noted  . 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 Decreased thyroid stimulating hormone (TSH) level 07/19/2016  . Filamentary keratitis of right eye 06/26/2016  . Keratoconjunctivitis sicca due to decreased tear production, bilateral 06/26/2016  . Blepharitis of both eyes 06/26/2016  . Atopic conjunctivitis of both eyes 06/26/2016  . Cerumen impaction 04/17/2016  . Hair loss 04/17/2016  . Breast cancer of upper-inner quadrant of left female breast ([place]) 10/12/2015  . Rhinorrhea 12/15/2014  . Chest pain 12/02/2014  . Hypothyroidism, postsurgical 07/21/2014  . CAD (coronary artery disease) 04/29/2014  . Abnormal finding on MRI of brain 04/14/2014  . Malignant neoplasm of thyroid gland ([place]) 04/14/2014  . History of thyroid cancer 03/27/2014  . Chronic venous insufficiency 12/23/2013  . Degeneration of lumbar or lumbosacral intervertebral disc 10/08/2013  . Idiopathic scoliosis 10/08/2013  . Osteoporosis 10/06/2013  . Essential  hypertension, benign 08/18/2013  . Venous stasis ulcer ([place]) 08/06/2013  . Grief 08/06/2013  . Hyperlipidemia 08/15/2010   Past Surgical History:  Procedure Laterality Date  . BREAST LUMPECTOMY WITH RADIOACTIVE SEED LOCALIZATION Left 03/29/2016   Procedure: LEFT BREAST LUMPECTOMY WITH RADIOACTIVE SEED LOCALIZATION;  Surgeon: Autumn Messing III, MD;  Location: [facility];  Service: General;  Laterality: Left;  LEFT BREAST LUMPECTOMY WITH RADIOACTIVE SEED LOCALIZATION  . cranotomy    . THYROIDECTOMY     Social History  Substance Use Topics  . Smoking status: Never Smoker  . Smokeless tobacco: Never Used  . Alcohol use 0.0 oz/week     Comment: Occasional   Family History  Problem Relation Age of Onset  . CAD Sister   . Cancer Sister   . Heart disease Sister   . Diabetes Father   . COPD Father      Current medication list and allergy/intolerance information reviewed:   Current Outpatient Prescriptions on File Prior to Visit  Medication Sig Dispense Refill  . alendronate (FOSAMAX) 70 MG tablet TAKE 1 TABLET BY MOUTH EVERY 7 DAYS WITH A FULL GLASS OF WATER AND ON AN EMPTY STOMACH 12 tablet 0  . aspirin 81 MG tablet Take 81 mg by mouth daily.    . cholecalciferol (VITAMIN D) 1000 units tablet Take 1,000 Units by mouth daily. Reported on 01/05/2016    . gabapentin (NEURONTIN) 100 MG capsule Take 1-3 capsules (100-300 mg total) by mouth 3 (three) times daily. 60 capsule 1  . HYDROcodone-acetaminophen (NORCO) 5-325 MG tablet Take 1-2 tablets by mouth every 6 (six) hours as needed. 20 tablet 0  .  letrozole (FEMARA) 2.5 MG tablet Take 1 tablet (2.5 mg total) by mouth daily. 90 tablet 4  . levothyroxine (SYNTHROID, LEVOTHROID) 150 MCG tablet Take 1 tablet (150 mcg total) by mouth daily. 90 tablet 3  . Lifitegrast (XIIDRA) 5 % SOLN Apply 1 drop to eye 2 (two) times daily. 1 each 0  . metoprolol succinate (TOPROL-XL) 25 MG 24 hr tablet Take 1 tablet (25 mg total) by mouth daily.  Replaces half tab regimen. 90 tablet 0  . Polyethyl Glycol-Propyl Glycol (SYSTANE) 0.4-0.3 % GEL ophthalmic gel Place 1 application into both eyes 4 (four) times daily as needed. 1 Bottle 0  . Red Yeast Rice 600 MG TABS Four by mouth daily to help lower cholesterol 120 tablet 11  . triamcinolone cream (KENALOG) 0.1 % Apply 1 application topically 2 (two) times daily. 30 g 0  . zoster vaccine live, PF, (ZOSTAVAX) 96295 UNT/0.65ML injection Inject 19,400 Units into the skin once. 1 each 0   No current facility-administered medications on file prior to visit.    Allergies  Allergen Reactions  . Levaquin  [Levofloxacin In D5w] Other (See Comments)  . Amoxicillin   . Ciprofloxacin   . Colesevelam   . Doxycycline   . Levofloxacin   . Neomycin     Allergic reaction only topically.  . Other Itching    curad triple antibiotic ointment.  . Statins     Hives  . Tape       Review of Systems:  Constitutional: No recent illness  Cardiac: No  chest pain  Respiratory:  No  shortness of breath. No  Cough  Musculoskeletal: No new myalgia/arthralgia   Exam:  BP 117/73   Pulse 64   Wt 129 lb (58.5 kg)   BMI 20.20 kg/m   Constitutional: VS see above. General Appearance: alert, well-developed, well-nourished, NAD  Respiratory: Normal respiratory effort. no wheeze, no rhonchi, no rales  Cardiovascular: S1/S2 normal, no murmur, no rub/gallop auscultated. RRR.   Musculoskeletal: Gait normal. Symmetric and independent movement of all extremities  Neurological: Normal balance/coordination. No tremor.     ASSESSMENT/PLAN:   Essential hypertension, benign  Thyroid cancer ([place]) - Plan: TSH  Malignant neoplasm of upper-inner quadrant of left female breast, unspecified estrogen receptor status ([place])  Coronary artery disease involving native coronary artery of native heart without angina pectoris  Hyperlipidemia, unspecified hyperlipidemia type  Osteoporosis without current  pathological fracture, unspecified osteoporosis type  Venous stasis ulcer of left lower extremity [facility])    Patient Instructions  Please schedule follow-up with Dr [name] to recheck thyroid levels and review MRI situation.   Physical to gabapentin on the patient's visit summary as preferable treatment for pain/her seizure associated with venous stasis ulcer. Opiate pain medication to be used sparingly if at all.  Visit summary with medication list and pertinent instructions was printed for patient to review. All questions at time of visit were answered - patient instructed to contact office with any additional concerns. ER/RTC precautions were reviewed with the patient. Follow-up plan: Return in about 3 months (around 10/23/2016) for [place], sooner if needed.  Note: Total time spent 25 minutes, greater than 50% of the visit was spent face-to-face counseling and coordinating care for the following: The primary encounter diagnosis was Essential hypertension, benign. Diagnoses of Thyroid cancer ([place]), Malignant neoplasm of upper-inner quadrant of left female breast, unspecified estrogen receptor status ([place]), Coronary artery disease involving native coronary artery of native heart without angina pectoris, Hyperlipidemia, unspecified hyperlipidemia type, Osteoporosis  without current pathological fracture, unspecified osteoporosis type, and Venous stasis ulcer of left lower extremity ([place]) were also pertinent to this visit.Marland Kitchen

## 2016-07-25 NOTE — Patient Instructions (Signed)
Please schedule follow-up with Dr [name] to recheck thyroid levels and review MRI situation.

## 2016-07-26 LAB — TSH: TSH: 0.03 mIU/L — ABNORMAL LOW

## 2016-07-28 ENCOUNTER — Other Ambulatory Visit: Payer: Self-pay | Admitting: Physician Assistant

## 2016-07-28 MED ORDER — LEVOTHYROXINE SODIUM 137 MCG PO TABS: 137.0000 ug | ORAL_TABLET | Freq: Every day | ORAL | 1 refills | Status: DC

## 2016-07-28 NOTE — Addendum Note (Signed)
Addended by: Donella Stade on: 07/28/2016 12:11 PM   Modules accepted: Orders

## 2016-08-02 ENCOUNTER — Telehealth: Payer: Self-pay

## 2016-08-02 NOTE — Telephone Encounter (Signed)
Patient request refill for Hydrocodone 5-325mg  . Please advise. Kristy Olson,CMA

## 2016-08-02 NOTE — Telephone Encounter (Signed)
Left a message on patient vm to call back regarding information below. Rhonda Cunningham,CMA

## 2016-08-02 NOTE — Telephone Encounter (Signed)
At her visit, we discussed that she should be taking the gabapentin rather than the opioid pain medication, please confirm if she has tried this medicine first. I would like to keep her off of the stronger pain medicines if we are able to.

## 2016-08-03 NOTE — Telephone Encounter (Signed)
Made another attempt to call patient concerning message below. Kristy Olson,CMA

## 2016-09-05 ENCOUNTER — Emergency Department
Admission: EM | Admit: 2016-09-05 | Discharge: 2016-09-05 | Disposition: A | Discharge status: Home / Self Care | Payer: Medicare Other | Source: Home / Self Care | Attending: Family Medicine | Admitting: Family Medicine

## 2016-09-05 DIAGNOSIS — B9789 Other viral agents as the cause of diseases classified elsewhere: Secondary | ICD-10-CM

## 2016-09-05 DIAGNOSIS — J069 Acute upper respiratory infection, unspecified: Secondary | ICD-10-CM

## 2016-09-05 MED ORDER — OSELTAMIVIR PHOSPHATE 75 MG PO CAPS: 75.0000 mg | ORAL_CAPSULE | Freq: Two times a day (BID) | ORAL | 0 refills | Status: DC

## 2016-09-05 NOTE — Discharge Instructions (Signed)
Take plain guaifenesin (1200mg  extended release tabs such as Mucinex) twice daily, with plenty of water, for cough and congestion.  Get adequate rest.   Also recommend using saline nasal spray several times daily and saline nasal irrigation (AYR is a common brand).    Try warm salt water gargles for sore throat.  Stop all antihistamines for now, and other non-prescription cough/cold preparations. May take Tylenol or Ibuprofen for fever, body aches, sore throat, etc. May take Delsym Cough Suppressant at bedtime for nighttime cough.

## 2016-09-05 NOTE — ED Triage Notes (Signed)
Started Sunday night with sore throat.  Denies fever

## 2016-09-05 NOTE — ED Provider Notes (Signed)
[name]    CSN: [id number] Arrival date & time: 09/05/16  1553     History   Chief Complaint Chief Complaint  Patient presents with  . Sore Throat    HPI Kristy Olson is a 83 y.o. female.   After being at a funeral two days ago patient developed a sore throat but did not feel ill.  The sore throat improved, and then yesterday afternoon became worse with development of increased sinus congestion, fatigue, chills, and mild non-productive cough.  She is very concerned that she may be developing influenza.   The history is provided by the patient.    Past Medical History:  Diagnosis Date  . Abnormal finding on MRI of brain   . Breast cancer of upper-inner quadrant of left female breast ([place]) 10/12/2015  . CAD (coronary artery disease)   . Chronic venous insufficiency    Compression stockings, Dr. Carmine Savoy to evaluate late May 2015   . Degeneration of lumbar or lumbosacral intervertebral disc   . History of thyroid cancer    With reported brain mets.   . Hyperlipidemia   . Hypothyroid    Outside records report synthroid 155mcg despite her report of 156mcg. 02/2014 awaiting clarification   . Idiopathic scoliosis   . Osteoporosis    July 2014 DEXA   . PNEUMONIA, COMMUNITY ACQUIRED, PNEUMOCOCCAL   . Venous stasis ulcer ([place])     Patient Active Problem List   Diagnosis Date Noted  . 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 Decreased thyroid stimulating hormone (TSH) level 07/19/2016  . Filamentary keratitis of right eye 06/26/2016  . Keratoconjunctivitis sicca due to decreased tear production, bilateral 06/26/2016  . Blepharitis of both eyes 06/26/2016  . Atopic conjunctivitis of both eyes 06/26/2016  . Cerumen impaction 04/17/2016  . Hair loss 04/17/2016  . Breast cancer of upper-inner quadrant of left female breast ([place]) 10/12/2015  . Rhinorrhea 12/15/2014  . Chest pain 12/02/2014  . Hypothyroidism, postsurgical 07/21/2014  . CAD (coronary artery disease)  04/29/2014  . Abnormal finding on MRI of brain 04/14/2014  . Malignant neoplasm of thyroid gland ([place]) 04/14/2014  . History of thyroid cancer 03/27/2014  . Chronic venous insufficiency 12/23/2013  . Degeneration of lumbar or lumbosacral intervertebral disc 10/08/2013  . Idiopathic scoliosis 10/08/2013  . Osteoporosis 10/06/2013  . Essential hypertension, benign 08/18/2013  . Venous stasis ulcer of left lower extremity ([place]) 08/06/2013  . Grief 08/06/2013  . Hyperlipidemia 08/15/2010    Past Surgical History:  Procedure Laterality Date  . BREAST LUMPECTOMY WITH RADIOACTIVE SEED LOCALIZATION Left 03/29/2016   Procedure: LEFT BREAST LUMPECTOMY WITH RADIOACTIVE SEED LOCALIZATION;  Surgeon: Autumn Messing III, MD;  Location: [facility];  Service: General;  Laterality: Left;  LEFT BREAST LUMPECTOMY WITH RADIOACTIVE SEED LOCALIZATION  . cranotomy    . THYROIDECTOMY      OB History    No data available       Home Medications    Prior to Admission medications   Medication Sig Start Date End Date Taking? Authorizing Provider  alendronate (FOSAMAX) 70 MG tablet TAKE 1 TABLET BY MOUTH EVERY 7 DAYS WITH A FULL GLASS OF WATER AND ON AN EMPTY STOMACH 02/21/16   Renato Shin, MD  aspirin 81 MG tablet Take 81 mg by mouth daily.    Historical Provider, MD  cholecalciferol (VITAMIN D) 1000 units tablet Take 1,000 Units by mouth daily. Reported on 01/05/2016    Historical Provider, MD  gabapentin (NEURONTIN) 100 MG capsule  Take 1-3 capsules (100-300 mg total) by mouth 3 (three) times daily. 07/11/16   Emeterio Reeve, DO  HYDROcodone-acetaminophen (NORCO) 5-325 MG tablet Take 1-2 tablets by mouth every 6 (six) hours as needed. 07/11/16   Emeterio Reeve, DO  letrozole [facility]) 2.5 MG tablet Take 1 tablet (2.5 mg total) by mouth daily. 07/17/16   Volanda Napoleon, MD  levothyroxine (SYNTHROID, LEVOTHROID) 137 MCG tablet TAKE 1 TABLET(137 MCG) BY MOUTH DAILY BEFORE BREAKFAST 07/28/16   Jade L  Breeback, PA-C  Lifitegrast (XIIDRA) 5 % SOLN Apply 1 drop to eye 2 (two) times daily. 06/26/16   Emeterio Reeve, DO  metoprolol succinate (TOPROL-XL) 25 MG 24 hr tablet Take 1 tablet (25 mg total) by mouth daily. Replaces half tab regimen. 04/17/16   Marcial Pacas, DO  oseltamivir (TAMIFLU) 75 MG capsule Take 1 capsule (75 mg total) by mouth every 12 (twelve) hours. 09/05/16   Kandra Nicolas, MD  Polyethyl Glycol-Propyl Glycol (SYSTANE) 0.4-0.3 % GEL ophthalmic gel Place 1 application into both eyes 4 (four) times daily as needed. 06/26/16   Emeterio Reeve, DO  Red Yeast Rice 600 MG TABS Four by mouth daily to help lower cholesterol 01/05/16   Sean Hommel, DO  triamcinolone cream (KENALOG) 0.1 % Apply 1 application topically 2 (two) times daily. 02/09/16   Noland Fordyce, PA-C  zoster vaccine live, PF, (ZOSTAVAX) 16109 UNT/0.65ML injection Inject 19,400 Units into the skin once. 03/15/15   Marcial Pacas, DO    Family History Family History  Problem Relation Age of Onset  . CAD Sister   . Cancer Sister   . Heart disease Sister   . Diabetes Father   . COPD Father     Social History Social History  Substance Use Topics  . Smoking status: Never Smoker  . Smokeless tobacco: Never Used  . Alcohol use 0.0 oz/week     Comment: Occasional     Allergies   Levaquin  [levofloxacin in d5w]; Amoxicillin; Ciprofloxacin; Colesevelam; Doxycycline; Levofloxacin; Neomycin; Other; Statins; and Tape   Review of Systems Review of Systems + sore throat + cough No pleuritic pain No wheezing + nasal congestion + post-nasal drainage No sinus pain/pressure No itchy/red eyes No earache No hemoptysis No SOB No fever, + chills No nausea No vomiting No abdominal pain No diarrhea No urinary symptoms No skin rash + fatigue No myalgias No headache Used OTC meds without relief   Physical Exam Triage Vital Signs ED Triage Vitals  Enc Vitals Group     BP 09/05/16 1626 129/67     Pulse Rate  09/05/16 1626 81     Resp --      Temp 09/05/16 1626 98.5 F (36.9 C)     Temp Source 09/05/16 1626 Oral     SpO2 09/05/16 1626 96 %     Weight 09/05/16 1627 127 lb (57.6 kg)     Height 09/05/16 1627 5\' 7"  (1.702 m)     Head Circumference --      Peak Flow --      Pain Score 09/05/16 1628 7     Pain Loc --      Pain Edu? --      Excl. in [place]? --    No data found.   Updated Vital Signs BP 129/67 (BP Location: Left Arm)   Pulse 81   Temp 98.5 F (36.9 C) (Oral)   Ht 5\' 7"  (1.702 m)   Wt 127 lb (57.6 kg)   SpO2 96%  BMI 19.89 kg/m   Visual Acuity Right Eye Distance:   Left Eye Distance:   Bilateral Distance:    Right Eye Near:   Left Eye Near:    Bilateral Near:     Physical Exam Nursing notes and Vital Signs reviewed. Appearance:  Patient appears stated age, and in no acute distress Eyes:  Pupils are equal, round, and reactive to light and accomodation.  Extraocular movement is intact.  Conjunctivae are not inflamed  Ears:  Canals normal.  Tympanic membranes normal.  Nose:  Mildly congested turbinates.  No sinus tenderness  Pharynx:  Normal Neck:  Supple.  Tender enlarged posterior/lateral nodes are palpated bilaterally  Lungs:  Clear to auscultation.  Breath sounds are equal.  Moving air well. Heart:  Regular rate and rhythm without murmurs, rubs, or gallops.  Abdomen:  Nontender without masses or hepatosplenomegaly.  Bowel sounds are present.  No CVA or flank tenderness.  Extremities:  No edema.  Skin:  No rash present.    UC Treatments / Results  Labs (all labs ordered are listed, but only abnormal results are displayed) Labs Reviewed  POCT RAPID STREP A (OFFICE)    EKG  EKG Interpretation None       Radiology No results found.  Procedures Procedures (including critical care time)  Medications Ordered in UC Medications - No data to display   Initial Impression / Assessment and Plan / UC Course  I have reviewed the triage vital signs and  the nursing notes.  Pertinent labs & imaging results that were available during my care of the patient were reviewed by me and considered in my medical decision making (see chart for details).  Clinical Course   Because of patient's concern for possible influenza, will begin Tamiflu. Take plain guaifenesin (1200mg  extended release tabs such as Mucinex) twice daily, with plenty of water, for cough and congestion.  Get adequate rest.   Also recommend using saline nasal spray several times daily and saline nasal irrigation (AYR is a common brand).    Try warm salt water gargles for sore throat.  Stop all antihistamines for now, and other non-prescription cough/cold preparations. May take Tylenol or Ibuprofen for fever, body aches, sore throat, etc. May take Delsym Cough Suppressant at bedtime for nighttime cough.  Followup with Family Doctor if not improved in one week.      Final Clinical Impressions(s) / UC Diagnoses   Final diagnoses:  Viral URI with cough    New Prescriptions New Prescriptions   OSELTAMIVIR (TAMIFLU) 75 MG CAPSULE    Take 1 capsule (75 mg total) by mouth every 12 (twelve) hours.     Kandra Nicolas, MD 09/05/16 [phone]

## 2016-09-06 ENCOUNTER — Ambulatory Visit: Payer: Self-pay | Admitting: Osteopathic Medicine

## 2016-10-23 ENCOUNTER — Ambulatory Visit: Payer: Self-pay

## 2016-10-23 ENCOUNTER — Ambulatory Visit: Payer: Self-pay | Admitting: Osteopathic Medicine

## 2016-11-14 ENCOUNTER — Other Ambulatory Visit (HOSPITAL_BASED_OUTPATIENT_CLINIC_OR_DEPARTMENT_OTHER): Payer: Medicare Other

## 2016-11-14 ENCOUNTER — Ambulatory Visit (HOSPITAL_BASED_OUTPATIENT_CLINIC_OR_DEPARTMENT_OTHER): Payer: Medicare Other | Admitting: Family

## 2016-11-14 VITALS — BP 131/41 | HR 65 | Temp 97.4°F | Resp 19 | Wt 124.0 lb

## 2016-11-14 DIAGNOSIS — C50212 Malignant neoplasm of upper-inner quadrant of left female breast: Secondary | ICD-10-CM

## 2016-11-14 DIAGNOSIS — C50112 Malignant neoplasm of central portion of left female breast: Secondary | ICD-10-CM

## 2016-11-14 DIAGNOSIS — Z170 Estrogen receptor positive status [ER+]: Secondary | ICD-10-CM

## 2016-11-14 DIAGNOSIS — C73 Malignant neoplasm of thyroid gland: Secondary | ICD-10-CM

## 2016-11-14 DIAGNOSIS — C50912 Malignant neoplasm of unspecified site of left female breast: Secondary | ICD-10-CM

## 2016-11-14 LAB — CBC WITH DIFFERENTIAL (CANCER CENTER ONLY)
BASO#: 0 10*3/uL (ref 0.0–0.2)
BASO%: 0.2 % (ref 0.0–2.0)
EOS%: 3.3 % (ref 0.0–7.0)
Eosinophils Absolute: 0.2 10*3/uL (ref 0.0–0.5)
HEMATOCRIT: 34.2 % — AB (ref 34.8–46.6)
HGB: 11.3 g/dL — ABNORMAL LOW (ref 11.6–15.9)
LYMPH#: 1.4 10*3/uL (ref 0.9–3.3)
LYMPH%: 25 % (ref 14.0–48.0)
MCH: 30.4 pg (ref 26.0–34.0)
MCHC: 33 g/dL (ref 32.0–36.0)
MCV: 92 fL (ref 81–101)
MONO#: 0.5 10*3/uL (ref 0.1–0.9)
MONO%: 8.8 % (ref 0.0–13.0)
NEUT#: 3.4 10*3/uL (ref 1.5–6.5)
NEUT%: 62.7 % (ref 39.6–80.0)
PLATELETS: 196 10*3/uL (ref 145–400)
RBC: 3.72 10*6/uL (ref 3.70–5.32)
RDW: 14.2 % (ref 11.1–15.7)
WBC: 5.5 10*3/uL (ref 3.9–10.0)

## 2016-11-14 LAB — COMPREHENSIVE METABOLIC PANEL
ALT: 13 U/L (ref 0–55)
ANION GAP: 11 meq/L (ref 3–11)
AST: 19 U/L (ref 5–34)
Albumin: 3.7 g/dL (ref 3.5–5.0)
Alkaline Phosphatase: 81 U/L (ref 40–150)
BUN: 29.7 mg/dL — ABNORMAL HIGH (ref 7.0–26.0)
CALCIUM: 9.4 mg/dL (ref 8.4–10.4)
CHLORIDE: 104 meq/L (ref 98–109)
CO2: 26 meq/L (ref 22–29)
CREATININE: 1.1 mg/dL (ref 0.6–1.1)
EGFR: 49 mL/min/{1.73_m2} — ABNORMAL LOW (ref >90)
Glucose: 69 mg/dl — ABNORMAL LOW (ref 70–140)
POTASSIUM: 4.5 meq/L (ref 3.5–5.1)
Sodium: 141 mEq/L (ref 136–145)
Total Bilirubin: 0.41 mg/dL (ref 0.20–1.20)
Total Protein: 6.9 g/dL (ref 6.4–8.3)
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
## 2016-11-29 ENCOUNTER — Other Ambulatory Visit: Payer: Self-pay

## 2016-11-29 DIAGNOSIS — I2510 Atherosclerotic heart disease of native coronary artery without angina pectoris: Secondary | ICD-10-CM

## 2016-11-29 MED ORDER — METOPROLOL SUCCINATE ER 25 MG PO TB24: 25.0000 mg | ORAL_TABLET | Freq: Every day | ORAL | 0 refills | Status: DC

## 2017-01-22 ENCOUNTER — Emergency Department
Admission: EM | Admit: 2017-01-22 | Discharge: 2017-01-22 | Disposition: A | Discharge status: Home / Self Care | Payer: Medicare Other | Source: Home / Self Care | Attending: Family Medicine | Admitting: Family Medicine

## 2017-01-22 ENCOUNTER — Encounter: Payer: Self-pay | Admitting: Emergency Medicine

## 2017-01-22 DIAGNOSIS — R21 Rash and other nonspecific skin eruption: Secondary | ICD-10-CM

## 2017-01-22 MED ORDER — TRIAMCINOLONE ACETONIDE 40 MG/ML IJ SUSP
20.0000 mg | Freq: Once | INTRAMUSCULAR | Status: AC
  Administered 2017-01-22: 20 mg via INTRAMUSCULAR

## 2017-01-22 NOTE — ED Provider Notes (Signed)
[name]    CSN: [id number] Arrival date & time: 01/22/17  1543     History   Chief Complaint Chief Complaint  Patient presents with  . Pruritis    HPI Kristy Olson is a 83 y.o. female.   Patient ate at a local seafood restaurant two days ago to celebrate a birthday party.  That evening she began to develop mild itching under her bra elastic.  Yesterday she began developing more widespread rash and itching, especially on extensor surfaces.  No wheezing or shortness of breath.  She feels well otherwise.  Today she awoke with mild facial swelling.   The history is provided by the patient.  Rash  Location: extremities, face, and chest. Quality: dryness, itchiness, redness and swelling   Quality: not blistering, not burning, not painful, not peeling, not scaling and not weeping   Severity:  Mild Onset quality:  Gradual Duration:  2 days Timing:  Constant Progression:  Spreading Chronicity:  New Context: food   Context: not animal contact, not chemical exposure, not eggs, not exposure to similar rash, not hot tub use, not insect bite/sting, not medications, not new detergent/soap, not nuts, not plant contact and not sun exposure   Relieved by:  None tried Worsened by:  Nothing Ineffective treatments:  None tried Associated symptoms: no abdominal pain, no diarrhea, no fatigue, no fever, no headaches, no induration, no joint pain, no myalgias, no nausea, no periorbital edema, no shortness of breath, no sore throat, no throat swelling, no tongue swelling, no URI and not wheezing     Past Medical History:  Diagnosis Date  . Abnormal finding on MRI of brain   . Breast cancer of upper-inner quadrant of left female breast ([place]) 10/12/2015  . CAD (coronary artery disease)   . Chronic venous insufficiency    Compression stockings, Dr. Carmine Savoy to evaluate late May 2015   . Degeneration of lumbar or lumbosacral intervertebral disc   . History of thyroid cancer    With reported brain mets.   . Hyperlipidemia   . Hypothyroid    Outside records report synthroid 15mcg despite her report of 165mcg. 02/2014 awaiting clarification   . Idiopathic scoliosis   . Osteoporosis    July 2014 DEXA   . PNEUMONIA, COMMUNITY ACQUIRED, PNEUMOCOCCAL   . Venous stasis ulcer ([place])     Patient Active Problem List   Diagnosis Date Noted  . 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 Decreased thyroid stimulating hormone (TSH) level 07/19/2016  . Filamentary keratitis of right eye 06/26/2016  . Keratoconjunctivitis sicca due to decreased tear production, bilateral 06/26/2016  . Blepharitis of both eyes 06/26/2016  . Atopic conjunctivitis of both eyes 06/26/2016  . Cerumen impaction 04/17/2016  . Hair loss 04/17/2016  . Breast cancer of upper-inner quadrant of left female breast ([place]) 10/12/2015  . Rhinorrhea 12/15/2014  . Chest pain 12/02/2014  . Hypothyroidism, postsurgical 07/21/2014  . CAD (coronary artery disease) 04/29/2014  . Abnormal finding on MRI of brain 04/14/2014  . Malignant neoplasm of thyroid gland ([place]) 04/14/2014  . History of thyroid cancer 03/27/2014  . Chronic venous insufficiency 12/23/2013  . Degeneration of lumbar or lumbosacral intervertebral disc 10/08/2013  . Idiopathic scoliosis 10/08/2013  . Osteoporosis 10/06/2013  . Essential hypertension, benign 08/18/2013  . Venous stasis ulcer of left lower extremity ([place]) 08/06/2013  . Grief 08/06/2013  . Hyperlipidemia 08/15/2010    Past Surgical History:  Procedure Laterality Date  . BREAST LUMPECTOMY WITH RADIOACTIVE SEED LOCALIZATION Left 03/29/2016  Procedure: LEFT BREAST LUMPECTOMY WITH RADIOACTIVE SEED LOCALIZATION;  Surgeon: Autumn Messing III, MD;  Location: [facility];  Service: General;  Laterality: Left;  LEFT BREAST LUMPECTOMY WITH RADIOACTIVE SEED LOCALIZATION  . cranotomy    . THYROIDECTOMY      OB History    No data available       Home Medications    Prior to  Admission medications   Medication Sig Start Date End Date Taking? Authorizing Provider  alendronate (FOSAMAX) 70 MG tablet TAKE 1 TABLET BY MOUTH EVERY 7 DAYS WITH A FULL GLASS OF WATER AND ON AN EMPTY STOMACH 02/21/16   Renato Shin, MD  aspirin 81 MG tablet Take 81 mg by mouth daily.    [provider]  cholecalciferol (VITAMIN D) 1000 units tablet Take 1,000 Units by mouth daily. Reported on 01/05/2016    [provider]  gabapentin (NEURONTIN) 100 MG capsule Take 1-3 capsules (100-300 mg total) by mouth 3 (three) times daily. 07/11/16   Emeterio Reeve, DO  HYDROcodone-acetaminophen (NORCO) 5-325 MG tablet Take 1-2 tablets by mouth every 6 (six) hours as needed. 07/11/16   Emeterio Reeve, DO  letrozole [facility]) 2.5 MG tablet Take 1 tablet (2.5 mg total) by mouth daily. 07/17/16   Volanda Napoleon, MD  levothyroxine (SYNTHROID, LEVOTHROID) 137 MCG tablet TAKE 1 TABLET(137 MCG) BY MOUTH DAILY BEFORE BREAKFAST 07/28/16   Breeback, Jade L, PA-C  Lifitegrast (XIIDRA) 5 % SOLN Apply 1 drop to eye 2 (two) times daily. 06/26/16   Emeterio Reeve, DO  metoprolol succinate (TOPROL-XL) 25 MG 24 hr tablet Take 1 tablet (25 mg total) by mouth daily. Due for follow up appointment. 11/29/16   Emeterio Reeve, DO  Polyethyl Glycol-Propyl Glycol (SYSTANE) 0.4-0.3 % GEL ophthalmic gel Place 1 application into both eyes 4 (four) times daily as needed. 06/26/16   Emeterio Reeve, DO  Red Yeast Rice 600 MG TABS Four by mouth daily to help lower cholesterol 01/05/16   Hommel, Sean, DO  triamcinolone cream (KENALOG) 0.1 % Apply 1 application topically 2 (two) times daily. 02/09/16   Noland Fordyce, PA-C    Family History Family History  Problem Relation Age of Onset  . CAD Sister   . Cancer Sister   . Heart disease Sister   . Diabetes Father   . COPD Father     Social History Social History  Substance Use Topics  . Smoking status: Never Smoker  . Smokeless tobacco: Never Used    . Alcohol use 0.0 oz/week     Comment: Occasional     Allergies   Neomy-bacit-polymyx-pramoxine; Levaquin  [levofloxacin in d5w]; Amoxicillin; Ciprofloxacin; Colesevelam; Doxycycline; Levofloxacin; Neomycin; Other; Statins; and Tape   Review of Systems Review of Systems  Constitutional: Negative for fatigue and fever.  HENT: Negative for sore throat.   Respiratory: Negative for shortness of breath and wheezing.   Gastrointestinal: Negative for abdominal pain, diarrhea and nausea.  Musculoskeletal: Negative for arthralgias and myalgias.  Skin: Positive for rash.  Neurological: Negative for headaches.     Physical Exam Triage Vital Signs ED Triage Vitals  Enc Vitals Group     BP 01/22/17 1622 126/71     Pulse Rate 01/22/17 1622 86     Resp --      Temp 01/22/17 1622 98.4 F (36.9 C)     Temp Source 01/22/17 1622 Oral     SpO2 01/22/17 1622 95 %     Weight 01/22/17 1622 118 lb (53.5 kg)  Height --      Head Circumference --      Peak Flow --      Pain Score 01/22/17 1623 0     Pain Loc --      Pain Edu? --      Excl. in [place]? --    No data found.   Updated Vital Signs BP 126/71 (BP Location: Right Arm)   Pulse 86   Temp 98.4 F (36.9 C) (Oral)   Wt 118 lb (53.5 kg)   SpO2 95%   BMI 18.48 kg/m   Visual Acuity Right Eye Distance:   Left Eye Distance:   Bilateral Distance:    Right Eye Near:   Left Eye Near:    Bilateral Near:     Physical Exam  Constitutional: She appears well-developed and well-nourished. No distress.  HENT:  Head: Normocephalic.  Right Ear: External ear normal.  Left Ear: External ear normal.  Nose: Nose normal.  Mouth/Throat: Oropharynx is clear and moist.  Eyes: Conjunctivae are normal. Pupils are equal, round, and reactive to light.  Neck: Neck supple.  Cardiovascular: Normal heart sounds.   Pulmonary/Chest: Breath sounds normal.  Musculoskeletal: She exhibits no edema.  Lymphadenopathy:    She has no cervical  adenopathy.  Neurological: She is alert.  Skin: Skin is warm and dry.     Macular erythema face, upper chest and extremities.  Nursing note and vitals reviewed.    UC Treatments / Results  Labs (all labs ordered are listed, but only abnormal results are displayed) Labs Reviewed - No data to display  EKG  EKG Interpretation None       Radiology No results found.  Procedures Procedures (including critical care time)  Medications Ordered in UC Medications  triamcinolone acetonide (KENALOG-40) injection 20 mg (not administered)     Initial Impression / Assessment and Plan / UC Course  I have reviewed the triage vital signs and the nursing notes.  Pertinent labs & imaging results that were available during my care of the patient were reviewed by me and considered in my medical decision making (see chart for details).    Suspect rash initiated by food intake. Administered Kenalog 20mg  IM May take Benadryl 25mg  at bedtime, if needed, for itching. If symptoms become significantly worse during the night or over the weekend, proceed to the local emergency room.  Followup with Family Doctor if not improved in about 4 days.    Final Clinical Impressions(s) / UC Diagnoses   Final diagnoses:  Rash and nonspecific skin eruption    New Prescriptions New Prescriptions   No medications on file     Kandra Nicolas, MD 01/22/17 [phone]

## 2017-01-22 NOTE — ED Triage Notes (Signed)
Pt c/o itching and swelling on arms and hands that started suddenly last night. Denies new exposure.

## 2017-01-22 NOTE — Discharge Instructions (Signed)
May take Benadryl 25mg  at bedtime, if needed, for itching. If symptoms become significantly worse during the night or over the weekend, proceed to the local emergency room.

## 2017-01-29 ENCOUNTER — Encounter: Payer: Self-pay | Admitting: Osteopathic Medicine

## 2017-01-29 ENCOUNTER — Ambulatory Visit (INDEPENDENT_AMBULATORY_CARE_PROVIDER_SITE_OTHER): Payer: Medicare Other | Admitting: Osteopathic Medicine

## 2017-01-29 VITALS — BP 132/67 | HR 69 | Ht 67.0 in | Wt 118.0 lb

## 2017-01-29 DIAGNOSIS — Z23 Encounter for immunization: Secondary | ICD-10-CM

## 2017-01-29 DIAGNOSIS — Z0000 Encounter for general adult medical examination without abnormal findings: Secondary | ICD-10-CM | POA: Diagnosis not present

## 2017-01-29 DIAGNOSIS — R634 Abnormal weight loss: Secondary | ICD-10-CM | POA: Diagnosis not present

## 2017-01-29 LAB — CBC WITH DIFFERENTIAL/PLATELET
BASOS ABS: 0 {cells}/uL (ref 0–200)
Basophils Relative: 0 %
EOS ABS: 118 {cells}/uL (ref 15–500)
Eosinophils Relative: 2 %
HCT: 35.3 % (ref 35.0–45.0)
HEMOGLOBIN: 11.3 g/dL — AB (ref 11.7–15.5)
LYMPHS ABS: 1239 {cells}/uL (ref 850–3900)
Lymphocytes Relative: 21 %
MCH: 29.1 pg (ref 27.0–33.0)
MCHC: 32 g/dL (ref 32.0–36.0)
MCV: 91 fL (ref 80.0–100.0)
MONO ABS: 649 {cells}/uL (ref 200–950)
MPV: 10.9 fL (ref 7.5–12.5)
Monocytes Relative: 11 %
NEUTROS PCT: 66 %
Neutro Abs: 3894 cells/uL (ref 1500–7800)
Platelets: 227 10*3/uL (ref 140–400)
RBC: 3.88 MIL/uL (ref 3.80–5.10)
RDW: 13.8 % (ref 11.0–15.0)
WBC: 5.9 10*3/uL (ref 3.8–10.8)

## 2017-01-29 MED ORDER — ZOSTER VAC RECOMB ADJUVANTED 50 MCG/0.5ML IM SUSR: 0.5000 mL | Freq: Once | INTRAMUSCULAR | 1 refills | Status: AC

## 2017-01-29 NOTE — Patient Instructions (Signed)
Fall Prevention in the Home Falls can cause injuries and can affect people from all age groups. There are many simple things that you can do to make your home safe and to help prevent falls. What can I do on the outside of my home?  Regularly repair the edges of walkways and driveways and fix any cracks.  Remove high doorway thresholds.  Trim any shrubbery on the main path into your home.  Use bright outdoor lighting.  Clear walkways of debris and clutter, including tools and rocks.  Regularly check that handrails are securely fastened and in good repair. Both sides of any steps should have handrails.  Install guardrails along the edges of any raised decks or porches.  Have leaves, snow, and ice cleared regularly.  Use sand or salt on walkways during winter months.  In the garage, clean up any spills right away, including grease or oil spills. What can I do in the bathroom?  Use night lights.  Install grab bars by the toilet and in the tub and shower. Do not use towel bars as grab bars.  Use non-skid mats or decals on the floor of the tub or shower.  If you need to sit down while you are in the shower, use a plastic, non-slip stool.  Keep the floor dry. Immediately clean up any water that spills on the floor.  Remove soap buildup in the tub or shower on a regular basis.  Attach bath mats securely with double-sided non-slip rug tape.  Remove throw rugs and other tripping hazards from the floor. What can I do in the bedroom?  Use night lights.  Make sure that a bedside light is easy to reach.  Do not use oversized bedding that drapes onto the floor.  Have a firm chair that has side arms to use for getting dressed.  Remove throw rugs and other tripping hazards from the floor. What can I do in the kitchen?  Clean up any spills right away.  Avoid walking on wet floors.  Place frequently used items in easy-to-reach places.  If you need to reach for something above  you, use a sturdy step stool that has a grab bar.  Keep electrical cables out of the way.  Do not use floor polish or wax that makes floors slippery. If you have to use wax, make sure that it is non-skid floor wax.  Remove throw rugs and other tripping hazards from the floor. What can I do in the stairways?  Do not leave any items on the stairs.  Make sure that there are handrails on both sides of the stairs. Fix handrails that are broken or loose. Make sure that handrails are as long as the stairways.  Check any carpeting to make sure that it is firmly attached to the stairs. Fix any carpet that is loose or worn.  Avoid having throw rugs at the top or bottom of stairways, or secure the rugs with carpet tape to prevent them from moving.  Make sure that you have a light switch at the top of the stairs and the bottom of the stairs. If you do not have them, have them installed. What are some other fall prevention tips?  Wear closed-toe shoes that fit well and support your feet. Wear shoes that have rubber soles or low heels.  When you use a stepladder, make sure that it is completely opened and that the sides are firmly locked. Have someone hold the ladder while you are using   it. Do not climb a closed stepladder.  Add color or contrast paint or tape to grab bars and handrails in your home. Place contrasting color strips on the first and last steps.  Use mobility aids as needed, such as canes, walkers, scooters, and crutches.  Turn on lights if it is dark. Replace any light bulbs that burn out.  Set up furniture so that there are clear paths. Keep the furniture in the same spot.  Fix any uneven floor surfaces.  Choose a carpet design that does not hide the edge of steps of a stairway.  Be aware of any and all pets.  Review your medicines with your healthcare provider. Some medicines can cause dizziness or changes in blood pressure, which increase your risk of falling. Talk with  your health care provider about other ways that you can decrease your risk of falls. This may include working with a physical therapist or trainer to improve your strength, balance, and endurance. This information is not intended to replace advice given to you by your health care provider. Make sure you discuss any questions you have with your health care provider. Document Released: 07/28/2002 Document Revised: 01/04/2016 Document Reviewed: 09/11/2014 Elsevier Interactive Patient Education  2017 [place] Zoster (Shingles) Vaccine, RZV: What You Need to Know 1. Why get vaccinated? Shingles (also called herpes zoster, or just zoster) is a painful skin rash, often with blisters. Shingles is caused by the varicella zoster virus, the same virus that causes chickenpox. After you have chickenpox, the virus stays in your body and can cause shingles later in life. You can't catch shingles from another person. However, a person who has never had chickenpox (or chickenpox vaccine) could get chickenpox from someone with shingles. A shingles rash usually appears on one side of the face or body and heals within 2 to 4 weeks. Its main symptom is pain, which can be severe. Other symptoms can include fever, headache, chills and upset stomach. Very rarely, a shingles infection can lead to pneumonia, hearing problems, blindness, brain inflammation (encephalitis), or death. For about 1 person in 5, severe pain can continue even long after the rash has cleared up. This long-lasting pain is called post-herpetic neuralgia (PHN). Shingles is far more common in people 51 years of age and older than in younger people, and the risk increases with age. It is also more common in people whose immune system is weakened because of a disease such as cancer, or by drugs such as steroids or chemotherapy. At least 1 million people a year in the [place] get shingles. 2. Shingles vaccine (recombinant) Recombinant  shingles vaccine was approved by FDA in 2017 for the prevention of shingles. In clinical trials, it was more than 90% effective in preventing shingles. It can also reduce the likelihood of PHN. Two doses, 2 to 6 months apart, are recommended for adults 75 and older. This vaccine is also recommended for people who have already gotten the live shingles vaccine (Zostavax). There is no live virus in this vaccine. 3. Some people should not get this vaccine Tell your vaccine provider if you:  Have any severe, life-threatening allergies. A person who has ever had a life-threatening allergic reaction after a dose of recombinant shingles vaccine, or has a severe allergy to any component of this vaccine, may be advised not to be vaccinated. Ask your health care provider if you want information about vaccine components.  Are pregnant or breastfeeding. There is not much information about  use of recombinant shingles vaccine in pregnant or nursing women. Your healthcare provider might recommend delaying vaccination.  Are not feeling well. If you have a mild illness, such as a cold, you can probably get the vaccine today. If you are moderately or severely ill, you should probably wait until you recover. Your doctor can advise you.  4. Risks of a vaccine reaction With any medicine, including vaccines, there is a chance of reactions. After recombinant shingles vaccination, a person might experience:  Pain, redness, soreness, or swelling at the site of the injection  Headache, muscle aches, fever, shivering, fatigue  In clinical trials, most people got a sore arm with mild or moderate pain after vaccination, and some also had redness and swelling where they got the shot. Some people felt tired, had muscle pain, a headache, shivering, fever, stomach pain, or nausea. About 1 out of 6 people who got recombinant zoster vaccine experienced side effects that prevented them from doing regular activities. Symptoms went  away on their own in about 2 to 3 days. Side effects were more common in younger people. You should still get the second dose of recombinant zoster vaccine even if you had one of these reactions after the first dose. Other things that could happen after this vaccine:  People sometimes faint after medical procedures, including vaccination. Sitting or lying down for about 15 minutes can help prevent fainting and injuries caused by a fall. Tell your provider if you feel dizzy or have vision changes or ringing in the ears.  Some people get shoulder pain that can be more severe and longer-lasting than routine soreness that can follow injections. This happens very rarely.  Any medication can cause a severe allergic reaction. Such reactions to a vaccine are estimated at about 1 in a million doses, and would happen within a few minutes to a few hours after the vaccination. As with any medicine, there is a very remote chance of a vaccine causing a serious injury or death. The safety of vaccines is always being monitored. For more information, visit: http://www.aguilar.org/ 5. What if there is a serious problem? What should I look for?  Look for anything that concerns you, such as signs of a severe allergic reaction, very high fever, or unusual behavior. Signs of a severe allergic reaction can include hives, swelling of the face and throat, difficulty breathing, a fast heartbeat, dizziness, and weakness. These would usually start a few minutes to a few hours after the vaccination. What should I do?  If you think it is a severe allergic reaction or other emergency that can't wait, call 9-1-1 and get to the nearest hospital. Otherwise, call your health care provider. Afterward, the reaction should be reported to the Vaccine Adverse Event Reporting System (VAERS). Your doctor should file this report, or you can do it yourself through the VAERS web site atwww.vaers.https://www.bray.com/ by calling 616-355-1970. VAERS  does not give medical advice. 6. How can I learn more?  Ask your healthcare provider. He or she can give you the vaccine package insert or suggest other sources of information.  Call your local or state health department.  Contact the Centers for Disease Control and Prevention (CDC): ? Call 9511420977 (1-800-CDC-INFO) or ? Visit the CDC's website at http://hunter.com/ CDC Vaccine Information Statement (VIS) Recombinant Zoster Vaccine (10/02/2016) This information is not intended to replace advice given to you by your health care provider. Make sure you discuss any questions you have with your health care provider. Document Released:  10/17/2016 Document Revised: 10/17/2016 Document Reviewed: 10/17/2016 Elsevier Interactive Patient Education  [employer].

## 2017-01-29 NOTE — Progress Notes (Signed)
HPI: Kristy Olson is a 83 y.o. female  who presents to [facility] today, 01/29/17,  for Medicare Annual Wellness Exam  Patient presents for annual physical/Medicare wellness exam. few concerns today  Losing weight:  130s this time last year, 118 now, no abdominal pain, no signs or symptoms of bleeding Due to recheck thyroid on lower does of medications  Normal appetite but only 2 small meals per day No unusual fatigue or pains   I concern: Recent eye exam, eye doctor states wanted sugars checked. No history of diabetes.  Past medical, surgical, social and family history reviewed:  Patient Active Problem List   Diagnosis Date Noted  . 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 Decreased thyroid stimulating hormone (TSH) level 07/19/2016  . Filamentary keratitis of right eye 06/26/2016  . Keratoconjunctivitis sicca due to decreased tear production, bilateral 06/26/2016  . Blepharitis of both eyes 06/26/2016  . Atopic conjunctivitis of both eyes 06/26/2016  . Cerumen impaction 04/17/2016  . Hair loss 04/17/2016  . Breast cancer of upper-inner quadrant of left female breast ([place]) 10/12/2015  . Rhinorrhea 12/15/2014  . Chest pain 12/02/2014  . Hypothyroidism, postsurgical 07/21/2014  . CAD (coronary artery disease) 04/29/2014  . Abnormal finding on MRI of brain 04/14/2014  . Malignant neoplasm of thyroid gland ([place]) 04/14/2014  . History of thyroid cancer 03/27/2014  . Chronic venous insufficiency 12/23/2013  . Degeneration of lumbar or lumbosacral intervertebral disc 10/08/2013  . Idiopathic scoliosis 10/08/2013  . Osteoporosis 10/06/2013  . Essential hypertension, benign 08/18/2013  . Venous stasis ulcer of left lower extremity ([place]) 08/06/2013  . Grief 08/06/2013  . Hyperlipidemia 08/15/2010    Past Surgical History:  Procedure Laterality Date  . BREAST LUMPECTOMY WITH RADIOACTIVE SEED LOCALIZATION Left 03/29/2016   Procedure: LEFT BREAST  LUMPECTOMY WITH RADIOACTIVE SEED LOCALIZATION;  Surgeon: Autumn Messing III, MD;  Location: [facility];  Service: General;  Laterality: Left;  LEFT BREAST LUMPECTOMY WITH RADIOACTIVE SEED LOCALIZATION  . cranotomy    . THYROIDECTOMY      Social History   Social History  . Marital status: Widowed    Spouse name: N/A  . Number of children: 2  . Years of education: N/A   Occupational History  .  Retired   Social History Main Topics  . Smoking status: Never Smoker  . Smokeless tobacco: Never Used  . Alcohol use 0.0 oz/week     Comment: Occasional  . Drug use: No  . Sexual activity: Not on file   Other Topics Concern  . Not on file   Social History Narrative  . No narrative on file    Family History  Problem Relation Age of Onset  . CAD Sister   . Cancer Sister   . Heart disease Sister   . Diabetes Father   . COPD Father      Current medication list and allergy/intolerance information reviewed:    Outpatient Encounter Prescriptions as of 01/29/2017  Medication Sig  . alendronate (FOSAMAX) 70 MG tablet TAKE 1 TABLET BY MOUTH EVERY 7 DAYS WITH A FULL GLASS OF WATER AND ON AN EMPTY STOMACH  . aspirin 81 MG tablet Take 81 mg by mouth daily.  . cholecalciferol (VITAMIN D) 1000 units tablet Take 1,000 Units by mouth daily. Reported on 01/05/2016  . gabapentin (NEURONTIN) 100 MG capsule Take 1-3 capsules (100-300 mg total) by mouth 3 (three) times daily.  Marland Kitchen letrozole (FEMARA) 2.5 MG tablet Take 1 tablet (2.5 mg  total) by mouth daily.  Marland Kitchen levothyroxine (SYNTHROID, LEVOTHROID) 137 MCG tablet TAKE 1 TABLET(137 MCG) BY MOUTH DAILY BEFORE BREAKFAST  . Lifitegrast (XIIDRA) 5 % SOLN Apply 1 drop to eye 2 (two) times daily.  . metoprolol succinate (TOPROL-XL) 25 MG 24 hr tablet Take 1 tablet (25 mg total) by mouth daily. Due for follow up appointment.  Vladimir Faster Glycol-Propyl Glycol (SYSTANE) 0.4-0.3 % GEL ophthalmic gel Place 1 application into both eyes 4 (four) times  daily as needed.  . Red Yeast Rice 600 MG TABS Four by mouth daily to help lower cholesterol  . triamcinolone cream (KENALOG) 0.1 % Apply 1 application topically 2 (two) times daily.  . [DISCONTINUED] HYDROcodone-acetaminophen (NORCO) 5-325 MG tablet Take 1-2 tablets by mouth every 6 (six) hours as needed.   No facility-administered encounter medications on file as of 01/29/2017.     Allergies  Allergen Reactions  . Neomy-Bacit-Polymyx-Pramoxine Itching  . Levaquin  [Levofloxacin In D5w] Other (See Comments)  . Amoxicillin   . Ciprofloxacin   . Colesevelam   . Doxycycline   . Levofloxacin   . Neomycin     Allergic reaction only topically.  . Other Itching    curad triple antibiotic ointment.  . Statins     Hives  . Tape        Review of Systems: Review of Systems - General ROS: negative   Medicare Wellness Questionnaire  Are there smokers in your home (other than you)? no  Depression Screen (Note: if answer to either of the following is "Yes", a more complete depression screening is indicated)   Q1: Over the past two weeks, have you felt down, depressed or hopeless? yes  Q2: Over the past two weeks, have you felt little interest or pleasure in doing things? no  Have you lost interest or pleasure in daily life? no  Do you often feel hopeless? yes  Do you cry easily over simple problems? no  Activities of Daily Living In your present state of health, do you have any difficulty performing the following activities?:  Driving? yes Managing money?  Not sure... Feeding yourself? no Getting from bed to chair? no Climbing a flight of stairs? no Preparing food and eating?: no Bathing or showering? yes Getting dressed: no Getting to the toilet? yes Using the toilet: no Moving around from place to place: no In the past year have you fallen or had a near fall?: yes  Hearing Difficulties:  Do you often ask people to speak up or repeat themselves? yes Do you experience  ringing or noises in your ears? yes  Do you have difficulty understanding soft or whispered voices? yes  Memory Difficulties:  Do you feel that you have a problem with memory? "sometimes"  Do you often misplace items? no  Do you feel safe at home?  yes  Sexual Health:   Are you sexually active?  Yes  Do you have more than one partner?  No  Advanced Directives:   Advanced directives discussed: has an advanced directive - a copy HAS NOT been provided.  Additional information provided: yes  Risk Factors  Current exercise habits: minimal  Dietary issues discussed: Likely inadequate calorie intake  Cardiac risk factors: hypertension, generalized debilitation, positive family history   Exam:  BP 132/67   Pulse 69   Ht 5\' 7"  (1.702 m)   Wt 118 lb (53.5 kg)   BMI 18.48 kg/m   Constitutional: VS see above. General Appearance: alert, well-developed, well-nourished, NAD  Ears, Nose, Mouth, Throat: MMM  Neck: No masses, trachea midline.   Respiratory: Normal respiratory effort. no wheeze, no rhonchi, no rales  Cardiovascular:No lower extremity edema.   Musculoskeletal: Gait normal. No clubbing/cyanosis of digits.   Neurological: Normal balance/coordination. No tremor. Recalls 3 objects and able to read face of watch with correct time.   Skin: warm, dry, intact. No rash/ulcer.   Psychiatric: Normal judgment/insight. Normal mood and affect. Oriented x3.     ASSESSMENT/PLAN:   Encounter for Medicare annual wellness exam  Medicare annual wellness visit, subsequent - Plan: Pneumococcal polysaccharide vaccine 23-valent greater than or equal to 2yo subcutaneous/IM, Tdap vaccine greater than or equal to 7yo IM, Zoster Vac Recomb Adjuvanted (SHINGRIX) injection  Weight loss - Plan: CBC with Differential/Platelet, COMPLETE METABOLIC PANEL WITH GFR, Lipid panel, TSH, Hemoglobin A1c, VITAMIN D 25 Hydroxy (Vit-D Deficiency, Fractures)   During the course of the visit the patient was  educated and counseled about appropriate screening and preventive services as noted above.   Patient Instructions (the written plan) was given to the patient.  Medicare Attestation I have personally reviewed: The patient's medical and social history Their use of alcohol, tobacco or illicit drugs Their current medications and supplements The patient's functional ability including ADLs,fall risks, home safety risks, cognitive, and hearing and visual impairment Diet and physical activities Evidence for depression or mood disorders  The patient's weight, height, BMI, and visual acuity have been recorded in the chart.  I have made referrals, counseling, and provided education to the patient based on review of the above and I have provided the patient with a written personalized care plan for preventive services.     Emeterio Reeve, DO   01/29/17   Visit summary with medication list and pertinent instructions was printed for patient to review. All questions at time of visit were answered - patient instructed to contact office with any additional concerns. ER/RTC precautions were reviewed with the patient. Follow-up plan: Return in about 4 weeks (around 02/26/2017) for weight recheck .

## 2017-01-30 ENCOUNTER — Other Ambulatory Visit: Payer: Self-pay | Admitting: Osteopathic Medicine

## 2017-01-30 DIAGNOSIS — E039 Hypothyroidism, unspecified: Secondary | ICD-10-CM

## 2017-01-30 LAB — COMPLETE METABOLIC PANEL WITH GFR
ALBUMIN: 4.1 g/dL (ref 3.6–5.1)
ALK PHOS: 78 U/L (ref 33–130)
ALT: 11 U/L (ref 6–29)
AST: 17 U/L (ref 10–35)
BUN: 24 mg/dL (ref 7–25)
CALCIUM: 9.2 mg/dL (ref 8.6–10.4)
CO2: 26 mmol/L (ref 20–31)
Chloride: 100 mmol/L (ref 98–110)
Creat: 1.25 mg/dL — ABNORMAL HIGH (ref 0.60–0.88)
GFR, Est African American: 46 mL/min — ABNORMAL LOW (ref >=60)
GFR, Est Non African American: 40 mL/min — ABNORMAL LOW (ref >=60)
Glucose, Bld: 72 mg/dL (ref 65–99)
POTASSIUM: 4.6 mmol/L (ref 3.5–5.3)
Sodium: 137 mmol/L (ref 135–146)
Total Bilirubin: 0.4 mg/dL (ref 0.2–1.2)
Total Protein: 7 g/dL (ref 6.1–8.1)

## 2017-01-30 LAB — LIPID PANEL
CHOL/HDL RATIO: 4 ratio (ref <5.0)
CHOLESTEROL: 184 mg/dL (ref <200)
HDL: 46 mg/dL — ABNORMAL LOW (ref >50)
LDL Cholesterol: 96 mg/dL (ref <100)
Triglycerides: 211 mg/dL — ABNORMAL HIGH (ref <150)
VLDL: 42 mg/dL — ABNORMAL HIGH (ref <30)

## 2017-01-30 LAB — VITAMIN D 25 HYDROXY (VIT D DEFICIENCY, FRACTURES): Vit D, 25-Hydroxy: 71 ng/mL (ref 30–100)

## 2017-01-30 LAB — HEMOGLOBIN A1C
HEMOGLOBIN A1C: 5.5 % (ref <5.7)
Mean Plasma Glucose: 111 mg/dL

## 2017-01-30 LAB — TSH: TSH: 0.05 mIU/L — ABNORMAL LOW

## 2017-01-30 MED ORDER — LEVOTHYROXINE SODIUM 112 MCG PO TABS: 112.0000 ug | ORAL_TABLET | Freq: Every day | ORAL | 0 refills | Status: DC

## 2017-01-30 NOTE — Progress Notes (Signed)
New synthroid dose

## 2017-03-02 ENCOUNTER — Telehealth: Payer: Self-pay | Admitting: Osteopathic Medicine

## 2017-03-02 ENCOUNTER — Emergency Department
Admission: EM | Admit: 2017-03-02 | Discharge: 2017-03-02 | Disposition: A | Discharge status: Home / Self Care | Payer: Medicare Other | Source: Home / Self Care | Attending: Family Medicine | Admitting: Family Medicine

## 2017-03-02 DIAGNOSIS — H6121 Impacted cerumen, right ear: Secondary | ICD-10-CM

## 2017-03-02 DIAGNOSIS — H60331 Swimmer's ear, right ear: Secondary | ICD-10-CM

## 2017-03-02 MED ORDER — CARBAMIDE PEROXIDE 6.5 % OT SOLN: 5.0000 [drp] | Freq: Two times a day (BID) | OTIC | 0 refills | Status: DC

## 2017-03-02 NOTE — Telephone Encounter (Signed)
[facility] to see exactly what information is needed and where to send it, they have no record of Patient.   Attempted to contact Pt's daughter, no answer and no VM. Will attempt to re-contact.

## 2017-03-02 NOTE — Telephone Encounter (Signed)
Patient's daughter called request to know if pt's history form, physical form and current med list can be faxed to [facility] (f) [phone]. Thanks

## 2017-03-02 NOTE — ED Triage Notes (Signed)
Pt feels like there is water trapped in her ear.  Had this last fall and turned into an ear infection.

## 2017-03-02 NOTE — ED Provider Notes (Signed)
CSN: [id number]     Arrival date & time 03/02/17  1519 History   First MD Initiated Contact with Patient 03/02/17 1603     Chief Complaint  Patient presents with  . Ear Fullness   (Consider location/radiation/quality/duration/timing/severity/associated sxs/prior Treatment) HPI  Kristy Olson is a 83 y.o. female presenting to [facility] with c/o Right ear fullness since yesterday.  She thinks she got water stuck in it from the shower.  Hx of similar incident last year that turned into Swimmer's ear.  Pt is leaving for the beach tomorrow and wants to make sure she is okay to go.  She denies pain like she had last year.  Mild itching.  She has not tried anything for current symptoms but notes she still has ear drops left over from last Fall when she had swimmers ear. Denies other URI symptoms including, no cough, congestion, sore throat, fever, or chills.    Past Medical History:  Diagnosis Date  . Abnormal finding on MRI of brain   . Breast cancer of upper-inner quadrant of left female breast ([place]) 10/12/2015  . CAD (coronary artery disease)   . Chronic venous insufficiency    Compression stockings, Dr. Carmine Savoy to evaluate late May 2015   . Degeneration of lumbar or lumbosacral intervertebral disc   . History of thyroid cancer    With reported brain mets.   . Hyperlipidemia   . Hypothyroid    Outside records report synthroid 176mcg despite her report of 159mcg. 02/2014 awaiting clarification   . Idiopathic scoliosis   . Osteoporosis    July 2014 DEXA   . PNEUMONIA, COMMUNITY ACQUIRED, PNEUMOCOCCAL   . Venous stasis ulcer ([place])    Past Surgical History:  Procedure Laterality Date  . BREAST LUMPECTOMY WITH RADIOACTIVE SEED LOCALIZATION Left 03/29/2016   Procedure: LEFT BREAST LUMPECTOMY WITH RADIOACTIVE SEED LOCALIZATION;  Surgeon: Autumn Messing III, MD;  Location: [facility];  Service: General;  Laterality: Left;  LEFT BREAST LUMPECTOMY WITH RADIOACTIVE SEED LOCALIZATION  .  cranotomy    . THYROIDECTOMY     Family History  Problem Relation Age of Onset  . CAD Sister   . Cancer Sister   . Heart disease Sister   . Diabetes Father   . COPD Father    Social History  Substance Use Topics  . Smoking status: Never Smoker  . Smokeless tobacco: Never Used  . Alcohol use 0.0 oz/week     Comment: Occasional   OB History    No data available     Review of Systems  Constitutional: Negative for chills and fever.  HENT: Positive for ear pain ( Right ear "fullness"). Negative for ear discharge, postnasal drip, rhinorrhea and sore throat.   Respiratory: Negative for cough.   Gastrointestinal: Negative for nausea and vomiting.  Neurological: Negative for dizziness, light-headedness and headaches.    Allergies  Neomy-bacit-polymyx-pramoxine; Levaquin  [levofloxacin in d5w]; Amoxicillin; Ciprofloxacin; Colesevelam; Doxycycline; Levofloxacin; Neomycin; Other; Statins; and Tape  Home Medications   Prior to Admission medications   Medication Sig Start Date End Date Taking? Authorizing Provider  alendronate (FOSAMAX) 70 MG tablet TAKE 1 TABLET BY MOUTH EVERY 7 DAYS WITH A FULL GLASS OF WATER AND ON AN EMPTY STOMACH 02/21/16   Renato Shin, MD  aspirin 81 MG tablet Take 81 mg by mouth daily.    [provider]  carbamide peroxide (DEBROX) 6.5 % OTIC solution Place 5 drops into both ears 2 (two) times daily. As needed for ear  fullness 03/02/17   Noe Gens, PA-C  cholecalciferol (VITAMIN D) 1000 units tablet Take 1,000 Units by mouth daily. Reported on 01/05/2016    [provider]  gabapentin (NEURONTIN) 100 MG capsule Take 1-3 capsules (100-300 mg total) by mouth 3 (three) times daily. 07/11/16   Emeterio Reeve, DO  letrozole [facility]) 2.5 MG tablet Take 1 tablet (2.5 mg total) by mouth daily. 07/17/16   Volanda Napoleon, MD  levothyroxine (SYNTHROID, LEVOTHROID) 112 MCG tablet TAKE 1 TABLET BY MOUTH DAILY BEFORE BREAKFAST 01/30/17   Emeterio Reeve, DO  Lifitegrast [facility]) 5 % SOLN Apply 1 drop to eye 2 (two) times daily. 06/26/16   Emeterio Reeve, DO  metoprolol succinate (TOPROL-XL) 25 MG 24 hr tablet Take 1 tablet (25 mg total) by mouth daily. Due for follow up appointment. 11/29/16   Emeterio Reeve, DO  Polyethyl Glycol-Propyl Glycol (SYSTANE) 0.4-0.3 % GEL ophthalmic gel Place 1 application into both eyes 4 (four) times daily as needed. 06/26/16   Emeterio Reeve, DO  Red Yeast Rice 600 MG TABS Four by mouth daily to help lower cholesterol 01/05/16   Hommel, Sean, DO  triamcinolone cream (KENALOG) 0.1 % Apply 1 application topically 2 (two) times daily. 02/09/16   Noe Gens, PA-C   Meds Ordered and Administered this Visit  Medications - No data to display  BP (!) 147/62 (BP Location: Left Arm)   Pulse 60   Temp 98.1 F (36.7 C) (Oral)   Ht 5\' 7"  (1.702 m)   Wt 119 lb (54 kg)   SpO2 99%   BMI 18.64 kg/m  No data found.   Physical Exam  Constitutional: She is oriented to person, place, and time. She appears well-developed and well-nourished. No distress.  HENT:  Head: Normocephalic and atraumatic.  Right Ear: Tympanic membrane normal.  Left Ear: Tympanic membrane normal.  Nose: Nose normal.  Mouth/Throat: Uvula is midline, oropharynx is clear and moist and mucous membranes are normal.  Right ear: cerumen impaction. After cerumen removal- scant red blood in proximal ear canal. No edema or discharge. TM- normal.  Eyes: EOM are normal.  Neck: Normal range of motion. Neck supple.  Cardiovascular: Normal rate and regular rhythm.   Pulmonary/Chest: Effort normal and breath sounds normal. No stridor. No respiratory distress. She has no wheezes. She has no rales.  Musculoskeletal: Normal range of motion.  Lymphadenopathy:    She has no cervical adenopathy.  Neurological: She is alert and oriented to person, place, and time.  Skin: Skin is warm and dry. She is not diaphoretic.  Psychiatric: She has a normal  mood and affect. Her behavior is normal.  Nursing note and vitals reviewed.   Urgent Care Course     Procedures (including critical care time)  Labs Review Labs Reviewed - No data to display  Imaging Review No results found.    MDM   1. Acute swimmer's ear of right side   2. Impacted cerumen of right ear    Exam c/w cerumen impaction of Right ear with early signs of swimmer's ear.   Was going to discharge pt home with Cortisporin drops, however, pt has listed allergy to medication- itching.  Pt also believes she gets hives with Cipro.  Rx: [facility] Encouraged pt to check medication she has leftover at home. She may call us for a refill if out of date.  F/u with PCP in 1 week if not improving.    Noe Gens, [place] 03/02/17 [phone]

## 2017-03-05 ENCOUNTER — Telehealth: Payer: Self-pay

## 2017-03-05 NOTE — Telephone Encounter (Signed)
Left VM to call UC or follow up with PCP if any questions or problems.

## 2017-03-12 ENCOUNTER — Other Ambulatory Visit: Payer: Self-pay | Admitting: *Deleted

## 2017-03-12 ENCOUNTER — Ambulatory Visit (INDEPENDENT_AMBULATORY_CARE_PROVIDER_SITE_OTHER): Payer: Medicare Other | Admitting: Osteopathic Medicine

## 2017-03-12 ENCOUNTER — Encounter: Payer: Self-pay | Admitting: Osteopathic Medicine

## 2017-03-12 VITALS — BP 127/64 | HR 66 | Ht 67.0 in | Wt 119.0 lb

## 2017-03-12 DIAGNOSIS — R634 Abnormal weight loss: Secondary | ICD-10-CM

## 2017-03-12 DIAGNOSIS — Z170 Estrogen receptor positive status [ER+]: Secondary | ICD-10-CM

## 2017-03-12 DIAGNOSIS — R935 Abnormal findings on diagnostic imaging of other abdominal regions, including retroperitoneum: Secondary | ICD-10-CM

## 2017-03-12 DIAGNOSIS — E890 Postprocedural hypothyroidism: Secondary | ICD-10-CM

## 2017-03-12 DIAGNOSIS — Z85850 Personal history of malignant neoplasm of thyroid: Secondary | ICD-10-CM

## 2017-03-12 DIAGNOSIS — C73 Malignant neoplasm of thyroid gland: Secondary | ICD-10-CM

## 2017-03-12 DIAGNOSIS — D649 Anemia, unspecified: Secondary | ICD-10-CM

## 2017-03-12 DIAGNOSIS — C50212 Malignant neoplasm of upper-inner quadrant of left female breast: Secondary | ICD-10-CM

## 2017-03-12 DIAGNOSIS — I10 Essential (primary) hypertension: Secondary | ICD-10-CM

## 2017-03-12 DIAGNOSIS — R7989 Other specified abnormal findings of blood chemistry: Secondary | ICD-10-CM

## 2017-03-12 DIAGNOSIS — E559 Vitamin D deficiency, unspecified: Secondary | ICD-10-CM

## 2017-03-12 LAB — CBC
HEMATOCRIT: 36.9 % (ref 35.0–45.0)
Hemoglobin: 11.7 g/dL (ref 11.7–15.5)
MCH: 29 pg (ref 27.0–33.0)
MCHC: 31.7 g/dL — AB (ref 32.0–36.0)
MCV: 91.6 fL (ref 80.0–100.0)
MPV: 10.7 fL (ref 7.5–12.5)
Platelets: 212 10*3/uL (ref 140–400)
RBC: 4.03 MIL/uL (ref 3.80–5.10)
RDW: 14.7 % (ref 11.0–15.0)
WBC: 6.4 10*3/uL (ref 3.8–10.8)

## 2017-03-12 LAB — TSH: TSH: 0.12 mIU/L — ABNORMAL LOW

## 2017-03-12 NOTE — Progress Notes (Signed)
HPI: Kristy Olson is a 83 y.o. female  who presents to [facility] today, 03/12/17,  for chief complaint of:  Chief Complaint  Patient presents with  . Weight Loss    Weight loss over past year or so. Needs recheck TSH about now after dose change of meds (TSH was low in 01/2017). Has gained one pound since last visit. Patient was not sure why she is here for follow-up today, her most pressing concern is some interpersonal issues with family members, she is in the process of moving out of her daughter's house and into apartment/assisted living but there have been some delays in this due to recent vacations and doctor's appointments. Patient reports significant stress over this issue. She notes that over the past year, she has not been exercising very regularly nor eating as much as she usually does, no fever/night sweats, no significant new fatigue, no change in bowel habits.   Past medical history, surgical history, social history and family history reviewed.  Patient Active Problem List   Diagnosis Date Noted  . 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 Decreased thyroid stimulating hormone (TSH) level 07/19/2016  . Filamentary keratitis of right eye 06/26/2016  . Keratoconjunctivitis sicca due to decreased tear production, bilateral 06/26/2016  . Blepharitis of both eyes 06/26/2016  . Atopic conjunctivitis of both eyes 06/26/2016  . Cerumen impaction 04/17/2016  . Hair loss 04/17/2016  . Breast cancer of upper-inner quadrant of left female breast ([place]) 10/12/2015  . Rhinorrhea 12/15/2014  . Chest pain 12/02/2014  . Hypothyroidism, postsurgical 07/21/2014  . CAD (coronary artery disease) 04/29/2014  . Abnormal finding on MRI of brain 04/14/2014  . Malignant neoplasm of thyroid gland ([place]) 04/14/2014  . History of thyroid cancer 03/27/2014  . Chronic venous insufficiency 12/23/2013  . Degeneration of lumbar or lumbosacral intervertebral disc 10/08/2013  .  Idiopathic scoliosis 10/08/2013  . Osteoporosis 10/06/2013  . Essential hypertension, benign 08/18/2013  . Venous stasis ulcer of left lower extremity ([place]) 08/06/2013  . Grief 08/06/2013  . Hyperlipidemia 08/15/2010    Current medication list and allergy/intolerance information reviewed.   Current Outpatient Prescriptions on File Prior to Visit  Medication Sig Dispense Refill  . alendronate (FOSAMAX) 70 MG tablet TAKE 1 TABLET BY MOUTH EVERY 7 DAYS WITH A FULL GLASS OF WATER AND ON AN EMPTY STOMACH 12 tablet 0  . aspirin 81 MG tablet Take 81 mg by mouth daily.    . carbamide peroxide (DEBROX) 6.5 % OTIC solution Place 5 drops into both ears 2 (two) times daily. As needed for ear fullness 15 mL 0  . cholecalciferol (VITAMIN D) 1000 units tablet Take 1,000 Units by mouth daily. Reported on 01/05/2016    . gabapentin (NEURONTIN) 100 MG capsule Take 1-3 capsules (100-300 mg total) by mouth 3 (three) times daily. 60 capsule 1  . letrozole (FEMARA) 2.5 MG tablet Take 1 tablet (2.5 mg total) by mouth daily. 90 tablet 4  . levothyroxine (SYNTHROID, LEVOTHROID) 112 MCG tablet TAKE 1 TABLET BY MOUTH DAILY BEFORE BREAKFAST 90 tablet 0  . Lifitegrast (XIIDRA) 5 % SOLN Apply 1 drop to eye 2 (two) times daily. 1 each 0  . metoprolol succinate (TOPROL-XL) 25 MG 24 hr tablet Take 1 tablet (25 mg total) by mouth daily. Due for follow up appointment. 90 tablet 0  . Polyethyl Glycol-Propyl Glycol (SYSTANE) 0.4-0.3 % GEL ophthalmic gel Place 1 application into both eyes 4 (four) times daily as needed. 1 Bottle 0  .  Red Yeast Rice 600 MG TABS Four by mouth daily to help lower cholesterol 120 tablet 11  . triamcinolone cream (KENALOG) 0.1 % Apply 1 application topically 2 (two) times daily. 30 g 0   No current facility-administered medications on file prior to visit.    Allergies  Allergen Reactions  . Neomy-Bacit-Polymyx-Pramoxine Itching  . Levaquin  [Levofloxacin In D5w] Other (See Comments)  .  Amoxicillin   . Ciprofloxacin   . Colesevelam   . Doxycycline   . Levofloxacin   . Neomycin     Allergic reaction only topically.  . Other Itching    curad triple antibiotic ointment.  . Statins     Hives  . Tape       Review of Systems:  Constitutional: No recent illness, physically feels well today but is under stress due to recent moving plans  HEENT: No  headache, no vision change  Cardiac: No  chest pain, No  pressure, No palpitations  Respiratory:  No  shortness of breath. No  Cough  Gastrointestinal: No  abdominal pain, no change on bowel habits  Musculoskeletal: No new myalgia/arthralgia  Skin: No  Rash  Hem/Onc: No  easy bruising/bleeding, No  abnormal lumps/bumps  Neurologic: No  weakness, No  Dizziness  Psychiatric: No  concerns with depression, No  concerns with anxiety  Exam:  BP 127/64   Pulse 66   Ht _0  (1.702 m)   Wt 119 lb (54 kg)   BMI 18.64 kg/m   Constitutional: VS see above. General Appearance: alert, well-developed, thin but not cachectic, NAD  Eyes: Normal lids and conjunctive, non-icteric sclera  Ears, Nose, Mouth, Throat: MMM, Normal external inspection ears/nares/mouth/lips/gums.  Neck: No masses, trachea midline.   Respiratory: Normal respiratory effort. no wheeze, no rhonchi, no rales  Cardiovascular: S1/S2 normal, no rub/gallop auscultated. RRR.   Musculoskeletal: Gait normal. Symmetric and independent movement of all extremities  Neurological: Normal balance/coordination. No tremor.  Skin: warm, dry, intact.   Psychiatric: Normal judgment/insight. Normal mood and affect. Oriented x3.    Recent Results (from the past 2160 hour(s))  CBC with Differential/Platelet     Status: Abnormal   Collection Time: 01/29/17 12:04 PM  Result Value Ref Range   WBC 5.9 3.8 - 10.8 K/uL   RBC 3.88 3.80 - 5.10 MIL/uL   Hemoglobin 11.3 (L) 11.7 - 15.5 g/dL   HCT 35.3 35.0 - 45.0 %   MCV 91.0 80.0 - 100.0 fL   MCH 29.1 27.0 - 33.0  pg   MCHC 32.0 32.0 - 36.0 g/dL   RDW 13.8 11.0 - 15.0 %   Platelets 227 140 - 400 K/uL   MPV 10.9 7.5 - 12.5 fL   Neutro Abs 3,894 1,500 - 7,800 cells/uL   Lymphs Abs 1,239 850 - 3,900 cells/uL   Monocytes Absolute 649 200 - 950 cells/uL   Eosinophils Absolute 118 15 - 500 cells/uL   Basophils Absolute 0 0 - 200 cells/uL   Neutrophils Relative % 66 %   Lymphocytes Relative 21 %   Monocytes Relative 11 %   Eosinophils Relative 2 %   Basophils Relative 0 %   Smear Review Criteria for review not met   COMPLETE METABOLIC PANEL WITH GFR     Status: Abnormal   Collection Time: 01/29/17 12:04 PM  Result Value Ref Range   Sodium 137 135 - 146 mmol/L   Potassium 4.6 3.5 - 5.3 mmol/L   Chloride 100 98 - 110 mmol/L  CO2 26 20 - 31 mmol/L   Glucose, Bld 72 65 - 99 mg/dL   BUN 24 7 - 25 mg/dL   Creat 1.25 (H) 0.60 - 0.88 mg/dL    Comment:   For patients > or = 81 years of age: The upper reference limit for Creatinine is approximately 13% higher for people identified as African-American.      Total Bilirubin 0.4 0.2 - 1.2 mg/dL   Alkaline Phosphatase 78 33 - 130 U/L   AST 17 10 - 35 U/L   ALT 11 6 - 29 U/L   Total Protein 7.0 6.1 - 8.1 g/dL   Albumin 4.1 3.6 - 5.1 g/dL   Calcium 9.2 8.6 - 10.4 mg/dL   GFR, Est African American 46 (L) >=60 mL/min   GFR, Est Non African American 40 (L) >=60 mL/min  Lipid panel     Status: Abnormal   Collection Time: 01/29/17 12:04 PM  Result Value Ref Range   Cholesterol 184 <200 mg/dL   Triglycerides 211 (H) <150 mg/dL   HDL 46 (L) >50 mg/dL   Total CHOL/HDL Ratio 4.0 <5.0 Ratio   VLDL 42 (H) <30 mg/dL   LDL Cholesterol 96 <100 mg/dL  TSH     Status: Abnormal   Collection Time: 01/29/17 12:04 PM  Result Value Ref Range   TSH 0.05 (L) mIU/L    Comment:   Reference Range   > or = 20 Years  0.40-4.50   Pregnancy Range First trimester  0.26-2.66 Second trimester 0.55-2.73 Third trimester  0.43-2.91     Hemoglobin A1c     Status: None    Collection Time: 01/29/17 12:04 PM  Result Value Ref Range   Hgb A1c MFr Bld 5.5 <5.7 %    Comment:   For the purpose of screening for the presence of diabetes:   <5.7%       Consistent with the absence of diabetes 5.7-6.4 %   Consistent with increased risk for diabetes (prediabetes) >=6.5 %     Consistent with diabetes   This assay result is consistent with a decreased risk of diabetes.   Currently, no consensus exists regarding use of hemoglobin A1c for diagnosis of diabetes in children.   According to American Diabetes Association (ADA) guidelines, hemoglobin A1c <7.0% represents optimal control in non-pregnant diabetic patients. Different metrics may apply to specific patient populations. Standards of Medical Care in Diabetes (ADA).      Mean Plasma Glucose 111 mg/dL  VITAMIN D 25 Hydroxy (Vit-D Deficiency, Fractures)     Status: None   Collection Time: 01/29/17 12:04 PM  Result Value Ref Range   Vit D, 25-Hydroxy 71 30 - 100 ng/mL    Comment: Vitamin D Status           25-OH Vitamin D        Deficiency                <20 ng/mL        Insufficiency         20 - 29 ng/mL        Optimal             > or = 30 ng/mL   For 25-OH Vitamin D testing on patients on D2-supplementation and patients for whom quantitation of D2 and D3 fractions is required, the QuestAssureD 25-OH VIT D, (D2,D3), LC/MS/MS is recommended: order code [id number] (patients > 2 yrs).     No results found.  No flowsheet  data found.  Depression screen PHQ 2/9 03/11/2014  Decreased Interest 1  Down, Depressed, Hopeless 1  PHQ - 2 Score 2  Altered sleeping 1  Tired, decreased energy 1  Change in appetite 0  Feeling bad or failure about yourself  1  Trouble concentrating 0  Moving slowly or fidgety/restless 0  Suicidal thoughts 0  PHQ-9 Score 5      ASSESSMENT/PLAN: No dramatic recent weight loss but definite change from past year. Patient has history of thyroid cancer and breast cancer, she has  also been seeing wound care for nonhealing ulcer, I think at this point reasonable to proceed with CT chest/abdomen/pelvis (though I'm probably a bit more suspicious of lifestyle related nutritional problems or muscle loss due to lack of exercise, situational stress involving family members, inappropriate thyroid medication dose), she is also due to follow-up with endocrinology for repeat MRI of brain. We'll forward this note along to her oncology and endocrinology specialists, any additional input would be appreciated.  Weight loss - Plan: CBC, COMPLETE METABOLIC PANEL WITH GFR  Essential hypertension, benign - Plan: COMPLETE METABOLIC PANEL WITH GFR  Vitamin D deficiency - Plan: VITAMIN D 25 Hydroxy (Vit-D Deficiency, Fractures)  Anemia, unspecified type - Plan: CBC, Iron and TIBC, Ferritin  Postoperative hypothyroidism - Plan: TSH  Elevated serum creatinine - Plan: COMPLETE METABOLIC PANEL WITH GFR  History of thyroid cancer    Patient Instructions  Plan:  Labs today to recheck a few minor abnormalities   CT scan to check for possible weight loss cause such as cancer   I advise follow-up with Dr. [name] in the next few weeks/months   Make appointment sometime after your CT scan to review results     Follow-up plan: Return for recheck depending on labs and CT results .  Visit summary with medication list and pertinent instructions WAS printed for patient to review, alert [place] if any changes needed. All questions at time of visit were answered - patient instructed to contact office with any additional concerns. ER/RTC precautions were reviewed with the patient and understanding verbalized.

## 2017-03-12 NOTE — Patient Instructions (Addendum)
Plan:  Labs today to recheck a few minor abnormalities   CT scan to check for possible weight loss cause such as cancer   I advise follow-up with Dr. [name] in the next few weeks/months   Make appointment sometime after your CT scan to review results

## 2017-03-13 ENCOUNTER — Other Ambulatory Visit (HOSPITAL_BASED_OUTPATIENT_CLINIC_OR_DEPARTMENT_OTHER): Payer: Medicare Other

## 2017-03-13 ENCOUNTER — Other Ambulatory Visit: Payer: Self-pay | Admitting: Osteopathic Medicine

## 2017-03-13 ENCOUNTER — Ambulatory Visit (HOSPITAL_BASED_OUTPATIENT_CLINIC_OR_DEPARTMENT_OTHER): Payer: Medicare Other | Admitting: Hematology & Oncology

## 2017-03-13 VITALS — BP 130/45 | HR 58 | Temp 97.8°F | Resp 17 | Wt 119.0 lb

## 2017-03-13 DIAGNOSIS — R634 Abnormal weight loss: Secondary | ICD-10-CM

## 2017-03-13 DIAGNOSIS — Z170 Estrogen receptor positive status [ER+]: Secondary | ICD-10-CM

## 2017-03-13 DIAGNOSIS — C50212 Malignant neoplasm of upper-inner quadrant of left female breast: Secondary | ICD-10-CM

## 2017-03-13 DIAGNOSIS — C50912 Malignant neoplasm of unspecified site of left female breast: Secondary | ICD-10-CM | POA: Diagnosis not present

## 2017-03-13 DIAGNOSIS — L97829 Non-pressure chronic ulcer of other part of left lower leg with unspecified severity: Secondary | ICD-10-CM | POA: Diagnosis not present

## 2017-03-13 DIAGNOSIS — C73 Malignant neoplasm of thyroid gland: Secondary | ICD-10-CM

## 2017-03-13 LAB — CBC WITH DIFFERENTIAL (CANCER CENTER ONLY)
BASO#: 0 10*3/uL (ref 0.0–0.2)
BASO%: 0.5 % (ref 0.0–2.0)
EOS%: 3.3 % (ref 0.0–7.0)
Eosinophils Absolute: 0.2 10*3/uL (ref 0.0–0.5)
HCT: 35.4 % (ref 34.8–46.6)
HGB: 11.7 g/dL (ref 11.6–15.9)
LYMPH#: 1.7 10*3/uL (ref 0.9–3.3)
LYMPH%: 28 % (ref 14.0–48.0)
MCH: 30.4 pg (ref 26.0–34.0)
MCHC: 33.1 g/dL (ref 32.0–36.0)
MCV: 92 fL (ref 81–101)
MONO#: 0.5 10*3/uL (ref 0.1–0.9)
MONO%: 8.7 % (ref 0.0–13.0)
NEUT#: 3.6 10*3/uL (ref 1.5–6.5)
NEUT%: 59.5 % (ref 39.6–80.0)
PLATELETS: 181 10*3/uL (ref 145–400)
RBC: 3.85 10*6/uL (ref 3.70–5.32)
RDW: 14.6 % (ref 11.1–15.7)
WBC: 6 10*3/uL (ref 3.9–10.0)

## 2017-03-13 LAB — COMPREHENSIVE METABOLIC PANEL
ALT: 13 U/L (ref 0–55)
ANION GAP: 10 meq/L (ref 3–11)
AST: 21 U/L (ref 5–34)
Albumin: 3.7 g/dL (ref 3.5–5.0)
Alkaline Phosphatase: 80 U/L (ref 40–150)
BILIRUBIN TOTAL: 0.5 mg/dL (ref 0.20–1.20)
BUN: 21.2 mg/dL (ref 7.0–26.0)
CO2: 26 meq/L (ref 22–29)
CREATININE: 0.9 mg/dL (ref 0.6–1.1)
Calcium: 9.5 mg/dL (ref 8.4–10.4)
Chloride: 104 mEq/L (ref 98–109)
EGFR: 62 mL/min/{1.73_m2} — ABNORMAL LOW (ref >90)
GLUCOSE: 71 mg/dL (ref 70–140)
Potassium: 4.4 mEq/L (ref 3.5–5.1)
SODIUM: 141 meq/L (ref 136–145)
TOTAL PROTEIN: 7 g/dL (ref 6.4–8.3)

## 2017-03-13 LAB — COMPLETE METABOLIC PANEL WITH GFR
ALBUMIN: 4 g/dL (ref 3.6–5.1)
ALT: 12 U/L (ref 6–29)
AST: 22 U/L (ref 10–35)
Alkaline Phosphatase: 71 U/L (ref 33–130)
BILIRUBIN TOTAL: 0.4 mg/dL (ref 0.2–1.2)
BUN: 24 mg/dL (ref 7–25)
CO2: 25 mmol/L (ref 20–31)
Calcium: 9.1 mg/dL (ref 8.6–10.4)
Chloride: 101 mmol/L (ref 98–110)
Creat: 0.86 mg/dL (ref 0.60–0.88)
GFR, EST AFRICAN AMERICAN: 72 mL/min (ref >=60)
GFR, EST NON AFRICAN AMERICAN: 63 mL/min (ref >=60)
Glucose, Bld: 85 mg/dL (ref 65–99)
Potassium: 4.8 mmol/L (ref 3.5–5.3)
Sodium: 139 mmol/L (ref 135–146)
TOTAL PROTEIN: 6.8 g/dL (ref 6.1–8.1)

## 2017-03-13 LAB — IRON AND TIBC
%SAT: 25 % (ref 11–50)
IRON: 92 ug/dL (ref 45–160)
TIBC: 363 ug/dL (ref 250–450)
UIBC: 271 ug/dL

## 2017-03-13 LAB — VITAMIN D 25 HYDROXY (VIT D DEFICIENCY, FRACTURES): VIT D 25 HYDROXY: 63 ng/mL (ref 30–100)

## 2017-03-13 LAB — FERRITIN: Ferritin: 89 ng/mL (ref 20–288)

## 2017-03-13 MED ORDER — LEVOTHYROXINE SODIUM 88 MCG PO TABS: 88.0000 ug | ORAL_TABLET | Freq: Every day | ORAL | 0 refills | Status: DC

## 2017-03-13 NOTE — Addendum Note (Signed)
Addended by: Maryla Morrow on: 03/13/2017 11:59 AM   Modules accepted: Orders
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
## 2017-03-16 ENCOUNTER — Telehealth: Payer: Self-pay | Admitting: Osteopathic Medicine

## 2017-03-16 NOTE — Telephone Encounter (Signed)
-----   Message from Katha Hamming sent at 03/16/2017  2:59 PM EDT ----- Regarding: ct's We have left several messages for Kristy Olson to call us back to schedule her CT.  We left the third today.  Hopefully, she will call back by Monday to schedule.    Thanks, Hoyle Sauer

## 2017-03-16 NOTE — Telephone Encounter (Signed)
Got touch with Pt's daughter, they will contact [facility] to schedule.

## 2017-03-29 ENCOUNTER — Ambulatory Visit (HOSPITAL_BASED_OUTPATIENT_CLINIC_OR_DEPARTMENT_OTHER)
Admission: RE | Admit: 2017-03-29 | Discharge: 2017-03-29 | Disposition: A | Discharge status: Home / Self Care | Payer: Medicare Other | Source: Ambulatory Visit | Attending: Osteopathic Medicine | Admitting: Osteopathic Medicine

## 2017-03-29 DIAGNOSIS — I700 Atherosclerosis of aorta: Secondary | ICD-10-CM | POA: Insufficient documentation

## 2017-03-29 DIAGNOSIS — R918 Other nonspecific abnormal finding of lung field: Secondary | ICD-10-CM | POA: Insufficient documentation

## 2017-03-29 DIAGNOSIS — Z681 Body mass index (BMI) 19 or less, adult: Secondary | ICD-10-CM | POA: Insufficient documentation

## 2017-03-29 DIAGNOSIS — E279 Disorder of adrenal gland, unspecified: Secondary | ICD-10-CM | POA: Diagnosis not present

## 2017-03-29 DIAGNOSIS — I2510 Atherosclerotic heart disease of native coronary artery without angina pectoris: Secondary | ICD-10-CM | POA: Diagnosis not present

## 2017-03-29 DIAGNOSIS — K869 Disease of pancreas, unspecified: Secondary | ICD-10-CM | POA: Diagnosis not present

## 2017-03-29 DIAGNOSIS — R634 Abnormal weight loss: Secondary | ICD-10-CM | POA: Diagnosis present

## 2017-03-29 MED ORDER — IOPAMIDOL (ISOVUE-300) INJECTION 61%
100.0000 mL | Freq: Once | INTRAVENOUS | Status: AC | PRN
  Administered 2017-03-29: 100 mL via INTRAVENOUS

## 2017-03-29 NOTE — Addendum Note (Signed)
Addended by: Maryla Morrow on: 03/29/2017 04:47 PM   Modules accepted: Orders

## 2017-03-31 ENCOUNTER — Ambulatory Visit (HOSPITAL_BASED_OUTPATIENT_CLINIC_OR_DEPARTMENT_OTHER)
Admission: RE | Admit: 2017-03-31 | Discharge: 2017-03-31 | Disposition: A | Discharge status: Home / Self Care | Payer: Medicare Other | Source: Ambulatory Visit | Attending: Osteopathic Medicine | Admitting: Osteopathic Medicine

## 2017-03-31 DIAGNOSIS — Z681 Body mass index (BMI) 19 or less, adult: Secondary | ICD-10-CM | POA: Insufficient documentation

## 2017-03-31 DIAGNOSIS — R634 Abnormal weight loss: Secondary | ICD-10-CM | POA: Diagnosis not present

## 2017-03-31 DIAGNOSIS — R935 Abnormal findings on diagnostic imaging of other abdominal regions, including retroperitoneum: Secondary | ICD-10-CM | POA: Diagnosis present

## 2017-03-31 DIAGNOSIS — K7689 Other specified diseases of liver: Secondary | ICD-10-CM | POA: Insufficient documentation

## 2017-03-31 DIAGNOSIS — K869 Disease of pancreas, unspecified: Secondary | ICD-10-CM | POA: Diagnosis not present

## 2017-03-31 MED ORDER — GADOBENATE DIMEGLUMINE 529 MG/ML IV SOLN
10.0000 mL | Freq: Once | INTRAVENOUS | Status: AC | PRN
  Administered 2017-03-31: 10 mL via INTRAVENOUS

## 2017-04-02 NOTE — Progress Notes (Signed)
The MRI does not show anything that looks like cancer at this time There are cysts in the liver and pancreas, but they appear benign It is recommended to follow this up in 1 year with another CT I recommend scheduling an appt with Dr. Sheppard Coil when she returns in 1 week to go over the results in detail

## 2017-04-11 ENCOUNTER — Ambulatory Visit (INDEPENDENT_AMBULATORY_CARE_PROVIDER_SITE_OTHER): Payer: Medicare Other | Admitting: Osteopathic Medicine

## 2017-04-11 ENCOUNTER — Encounter: Payer: Self-pay | Admitting: Osteopathic Medicine

## 2017-04-11 VITALS — BP 137/63 | HR 84 | Wt 125.0 lb

## 2017-04-11 DIAGNOSIS — R634 Abnormal weight loss: Secondary | ICD-10-CM | POA: Diagnosis not present

## 2017-04-11 NOTE — Progress Notes (Signed)
HPI: Kristy Olson is a 83 y.o. female  who presents to [facility] today, 04/11/17,  for chief complaint of:  Chief Complaint  Patient presents with  . MRI results, weight check    Persistent concerning weight loss with negative lab workup prompted CT scan abdomen/pelvis and chest which demonstrated benign lung nodules, some concerning adrenal nodules and pancreatic nodules. MRI showed no adrenal pathology, likely benign pancreatic nodules and recommended pancreatic CT scan was recommended in 1 year.  Patient has gained a bit of weight, eating a bit better since moving into assisted living. Stress levels have decreased.   Past medical history, surgical history, social history and family history reviewed.  Patient Active Problem List   Diagnosis Date Noted  . 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 Decreased thyroid stimulating hormone (TSH) level 07/19/2016  . Filamentary keratitis of right eye 06/26/2016  . Keratoconjunctivitis sicca due to decreased tear production, bilateral 06/26/2016  . Blepharitis of both eyes 06/26/2016  . Atopic conjunctivitis of both eyes 06/26/2016  . Cerumen impaction 04/17/2016  . Hair loss 04/17/2016  . Breast cancer of upper-inner quadrant of left female breast ([place]) 10/12/2015  . Rhinorrhea 12/15/2014  . Chest pain 12/02/2014  . Hypothyroidism, postsurgical 07/21/2014  . CAD (coronary artery disease) 04/29/2014  . Abnormal finding on MRI of brain 04/14/2014  . Malignant neoplasm of thyroid gland ([place]) 04/14/2014  . History of thyroid cancer 03/27/2014  . Chronic venous insufficiency 12/23/2013  . Degeneration of lumbar or lumbosacral intervertebral disc 10/08/2013  . Idiopathic scoliosis 10/08/2013  . Osteoporosis 10/06/2013  . Essential hypertension, benign 08/18/2013  . Venous stasis ulcer of left lower extremity ([place]) 08/06/2013  . Grief 08/06/2013  . Hyperlipidemia 08/15/2010    Current medication list and  allergy/intolerance information reviewed.   Current Outpatient Prescriptions on File Prior to Visit  Medication Sig Dispense Refill  . alendronate (FOSAMAX) 70 MG tablet TAKE 1 TABLET BY MOUTH EVERY 7 DAYS WITH A FULL GLASS OF WATER AND ON AN EMPTY STOMACH 12 tablet 0  . aspirin 81 MG tablet Take 81 mg by mouth daily.    . carbamide peroxide (DEBROX) 6.5 % OTIC solution Place 5 drops into both ears 2 (two) times daily. As needed for ear fullness 15 mL 0  . cholecalciferol (VITAMIN D) 1000 units tablet Take 1,000 Units by mouth daily. Reported on 01/05/2016    . gabapentin (NEURONTIN) 100 MG capsule Take 1-3 capsules (100-300 mg total) by mouth 3 (three) times daily. 60 capsule 1  . letrozole (FEMARA) 2.5 MG tablet Take 1 tablet (2.5 mg total) by mouth daily. 90 tablet 4  . levothyroxine (SYNTHROID, LEVOTHROID) 88 MCG tablet Take 1 tablet (88 mcg total) by mouth daily before breakfast. 60 tablet 0  . Lifitegrast (XIIDRA) 5 % SOLN Apply 1 drop to eye 2 (two) times daily. 1 each 0  . metoprolol succinate (TOPROL-XL) 25 MG 24 hr tablet Take 1 tablet (25 mg total) by mouth daily. Due for follow up appointment. 90 tablet 0  . Polyethyl Glycol-Propyl Glycol (SYSTANE) 0.4-0.3 % GEL ophthalmic gel Place 1 application into both eyes 4 (four) times daily as needed. 1 Bottle 0  . Red Yeast Rice 600 MG TABS Four by mouth daily to help lower cholesterol 120 tablet 11  . triamcinolone cream (KENALOG) 0.1 % Apply 1 application topically 2 (two) times daily. 30 g 0   No current facility-administered medications on file prior to visit.    Allergies  Allergen Reactions  . Neomy-Bacit-Polymyx-Pramoxine Itching  . Levaquin  [Levofloxacin In D5w] Other (See Comments)  . Amoxicillin   . Ciprofloxacin   . Colesevelam   . Doxycycline   . Levofloxacin   . Neomycin     Allergic reaction only topically.  . Other Itching    curad triple antibiotic ointment.  . Statins     Hives  . Tape       Review of  Systems:  Constitutional: No recent illness, feels well today  Cardiac: No  chest pain  Respiratory:  No  shortness of breath.  Gastrointestinal: No  abdominal pain, no change on bowel habits  Neurologic: No  weakness, No  Dizziness  Psychiatric: No  concerns with depression, No  concerns with anxiety  Exam:  BP 137/63   Pulse 84   Wt 125 lb (56.7 kg)   BMI 19.58 kg/m  up from 119 lbs 03/13/17  Constitutional: VS see above. General Appearance: alert, well-developed, well-nourished, NAD  Eyes: Normal lids and conjunctive, non-icteric sclera  Ears, Nose, Mouth, Throat: MMM, Normal external inspection ears/nares/mouth/lips/gums.  Neck: No masses, trachea midline.   Respiratory: Normal respiratory effort. no wheeze, no rhonchi, no rales  Cardiovascular: S1/S2 normal, no murmur, no rub/gallop auscultated. RRR.   Musculoskeletal: Gait normal. Symmetric and independent movement of all extremities  Neurological: Normal balance/coordination. No tremor.  Skin: warm, dry, intact.   Psychiatric: Normal judgment/insight. Normal mood and affect. Oriented x3.      ASSESSMENT/PLAN:   Weight loss - doing well, weight gain a bit, I think likely family issues and stress were a role and we were on a wild goose chase, but given cancer hx can't be too careful. Reviewed MRI and CT results and reviewed scans/images with patient, all questions answered.     Follow-up plan: Return for medicare wellness check 01/2018, see me sooner if needed .  Visit summary with medication list and pertinent instructions was printed for patient to review, alert [place] if any changes needed. All questions at time of visit were answered - patient instructed to contact office with any additional concerns. ER/RTC precautions were reviewed with the patient and understanding verbalized.   Note: Total time spent 25 minutes, greater than 50% of the visit was spent face-to-face counseling and coordinating care for the  following: The encounter diagnosis was Weight loss.Marland Kitchen

## 2017-04-11 NOTE — Patient Instructions (Signed)
Scans look good and weight is improving  Any problems, let me know!

## 2017-04-17 ENCOUNTER — Telehealth: Payer: Self-pay

## 2017-04-17 NOTE — Telephone Encounter (Signed)
Would not feel comfortable diagnosing something like this over the phone. May be due to dietary changes but may be something else going on. Would advise she schedule an appointment if this persists or if she is worried, can try taking Imodium with meals.

## 2017-04-17 NOTE — Telephone Encounter (Signed)
Patient called with some concerns she stated that since she has been in the assisted living facility she has had a change in her bowels. She stated that she is having bowel movements  every few hours and her stool is soft but not watery, she states that the facility prepares her food and instead of her eating 2 times a day she now eats 3 times a day. Patient stated that she is a little concerned and she wants to know what she can do please advise. Rhonda Cunningham,CMA

## 2017-04-17 NOTE — Telephone Encounter (Signed)
Spoke to patient and she verbally understand. Kristy Olson,CMA

## 2017-04-30 ENCOUNTER — Ambulatory Visit (HOSPITAL_BASED_OUTPATIENT_CLINIC_OR_DEPARTMENT_OTHER): Payer: Medicare Other | Admitting: Hematology & Oncology

## 2017-04-30 ENCOUNTER — Other Ambulatory Visit (HOSPITAL_BASED_OUTPATIENT_CLINIC_OR_DEPARTMENT_OTHER): Payer: Medicare Other

## 2017-04-30 VITALS — BP 117/43 | HR 63 | Temp 98.0°F | Wt 123.1 lb

## 2017-04-30 DIAGNOSIS — C73 Malignant neoplasm of thyroid gland: Secondary | ICD-10-CM

## 2017-04-30 DIAGNOSIS — M818 Other osteoporosis without current pathological fracture: Secondary | ICD-10-CM

## 2017-04-30 DIAGNOSIS — C50212 Malignant neoplasm of upper-inner quadrant of left female breast: Secondary | ICD-10-CM

## 2017-04-30 DIAGNOSIS — C50912 Malignant neoplasm of unspecified site of left female breast: Secondary | ICD-10-CM

## 2017-04-30 LAB — CMP (CANCER CENTER ONLY)
ALBUMIN: 3.7 g/dL (ref 3.3–5.5)
ALK PHOS: 68 U/L (ref 26–84)
ALT: 19 U/L (ref 10–47)
AST: 27 U/L (ref 11–38)
BUN, Bld: 18 mg/dL (ref 7–22)
CO2: 31 mEq/L (ref 18–33)
Calcium: 9.2 mg/dL (ref 8.0–10.3)
Chloride: 102 mEq/L (ref 98–108)
Creat: 1 mg/dl (ref 0.6–1.2)
Glucose, Bld: 89 mg/dL (ref 73–118)
POTASSIUM: 3.8 meq/L (ref 3.3–4.7)
Sodium: 144 mEq/L (ref 128–145)
TOTAL PROTEIN: 7.2 g/dL (ref 6.4–8.1)
Total Bilirubin: 0.7 mg/dl (ref 0.20–1.60)

## 2017-04-30 LAB — CBC WITH DIFFERENTIAL (CANCER CENTER ONLY)
BASO#: 0 10*3/uL (ref 0.0–0.2)
BASO%: 0.4 % (ref 0.0–2.0)
EOS ABS: 0.1 10*3/uL (ref 0.0–0.5)
EOS%: 2 % (ref 0.0–7.0)
HEMATOCRIT: 37.2 % (ref 34.8–46.6)
HEMOGLOBIN: 12.4 g/dL (ref 11.6–15.9)
LYMPH#: 2 10*3/uL (ref 0.9–3.3)
LYMPH%: 28.6 % (ref 14.0–48.0)
MCH: 30.9 pg (ref 26.0–34.0)
MCHC: 33.3 g/dL (ref 32.0–36.0)
MCV: 93 fL (ref 81–101)
MONO#: 0.6 10*3/uL (ref 0.1–0.9)
MONO%: 8.1 % (ref 0.0–13.0)
NEUT#: 4.2 10*3/uL (ref 1.5–6.5)
NEUT%: 60.9 % (ref 39.6–80.0)
Platelets: 189 10*3/uL (ref 145–400)
RBC: 4.01 10*6/uL (ref 3.70–5.32)
RDW: 14.7 % (ref 11.1–15.7)
WBC: 6.8 10*3/uL (ref 3.9–10.0)

## 2017-04-30 LAB — IRON AND TIBC
%SAT: 26 % (ref 21–57)
IRON: 95 ug/dL (ref 41–142)
TIBC: 366 ug/dL (ref 236–444)
UIBC: 271 ug/dL (ref 120–384)

## 2017-04-30 LAB — FERRITIN: FERRITIN: 74 ng/mL (ref 9–269)

## 2017-04-30 NOTE — Progress Notes (Signed)
Hematology and Oncology Follow Up Visit  Kristy Olson [id number] November 16, 1931 83 y.o. 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 She is doing better. She is now in a new house. This is much better situation for her.  She has had no problems with weight loss. She actually is gaining a little weight. She is eating quite well.  There's been no problems with pain. She's had no nausea or vomiting. She's had no change in bowel or bladder habits.  Her Femara is being tolerated without any difficulties. She does not note any arthralgias.   Overall, her performance status is ECOG 1.   Medications:  Allergies as of 04/30/2017      Reactions   Neomy-bacit-polymyx-pramoxine Itching   Red Dye Itching   Red frosting caused itching and facial swelling   Levaquin  [levofloxacin In D5w] Other (See Comments)   Amoxicillin    Ciprofloxacin    Colesevelam    Doxycycline    Levofloxacin    Metoprolol Other (See Comments)   Neomycin    Allergic reaction only topically.   Other Itching   curad triple antibiotic ointment.   Statins    Hives   Tape       Medication List       Accurate as of 04/30/17 12:52 PM. Always use your most recent med list.          alendronate 70 MG tablet Commonly known as:  FOSAMAX TAKE 1 TABLET BY MOUTH EVERY 7 DAYS WITH A FULL GLASS OF WATER AND ON AN EMPTY STOMACH   aspirin 81 MG tablet Take 81 mg by mouth daily.   cholecalciferol 1000 units tablet Commonly known as:  VITAMIN D Take 1,000 Units by mouth daily. Reported on 01/05/2016   letrozole 2.5 MG tablet Commonly known as:  FEMARA Take 1 tablet (2.5 mg total) by mouth daily.   levothyroxine 88 MCG tablet Commonly known as:  SYNTHROID, LEVOTHROID Take 1 tablet (88 mcg total) by  mouth daily before breakfast.   Lifitegrast 5 % Soln Commonly known as:  XIIDRA Apply 1 drop to eye 2 (two) times daily.   metoprolol succinate 25 MG 24 hr tablet Commonly known as:  TOPROL-XL Take 1 tablet (25 mg total) by mouth daily. Due for follow up appointment.   Red Yeast Rice 600 MG Tabs Four by mouth daily to help lower cholesterol       Allergies:  Allergies  Allergen Reactions  . Neomy-Bacit-Polymyx-Pramoxine Itching  . Red Dye Itching    Red frosting caused itching and facial swelling  . Levaquin  [Levofloxacin In D5w] Other (See Comments)  . Amoxicillin   . Ciprofloxacin   . Colesevelam   . Doxycycline   . Levofloxacin   . Metoprolol Other (See Comments)  . Neomycin     Allergic reaction only topically.  . Other Itching    curad triple antibiotic ointment.  . Statins     Hives  . Tape     Past Medical History, Surgical history, Social history, and Family History were reviewed and updated.  Review of Systems: As stated in the interim history  Physical Exam:  weight is 123 lb 1.9 oz (55.8 kg). Her oral temperature is 98 F (36.7 C). Her blood pressure is 117/43 (  abnormal) and her pulse is 63.   Wt Readings from Last 3 Encounters:  04/30/17 123 lb 1.9 oz (55.8 kg)  04/11/17 125 lb (56.7 kg)  03/13/17 119 lb (54 kg)    Physical Exam  Constitutional: She is oriented to person, place, and time.  HENT:  Head: Normocephalic and atraumatic.  Mouth/Throat: Oropharynx is clear and moist.  Eyes: Pupils are equal, round, and reactive to light. EOM are normal.  Neck: Normal range of motion.  Cardiovascular: Normal rate, regular rhythm and normal heart sounds.   Pulmonary/Chest: Effort normal and breath sounds normal.  Abdominal: Soft. Bowel sounds are normal.  Musculoskeletal: Normal range of motion. She exhibits no edema, tenderness or deformity.  Lymphadenopathy:    She has no cervical adenopathy.  Neurological: She is alert and oriented to person,  place, and time.  Skin: Skin is warm and dry. No rash noted. No erythema.  Psychiatric: She has a normal mood and affect. Her behavior is normal. Judgment and thought content normal.  Vitals reviewed.    Lab Results  Component Value Date   WBC 6.8 04/30/2017   HGB 12.4 04/30/2017   HCT 37.2 04/30/2017   MCV 93 04/30/2017   PLT 189 04/30/2017   Lab Results  Component Value Date   FERRITIN 89 03/12/2017   IRON 92 03/12/2017   TIBC 363 03/12/2017   UIBC 271 03/12/2017   IRONPCTSAT 25 03/12/2017   Lab Results  Component Value Date   RBC 4.01 04/30/2017   No results found for: KPAFRELGTCHN, LAMBDASER, KAPLAMBRATIO No results found for: IGGSERUM, IGA, IGMSERUM No results found for: Odetta Pink, SPEI   Chemistry      Component Value Date/Time   NA 144 04/30/2017 1134   NA 141 03/13/2017 1143   K 3.8 04/30/2017 1134   K 4.4 03/13/2017 1143   CL 102 04/30/2017 1134   CO2 31 04/30/2017 1134   CO2 26 03/13/2017 1143   BUN 18 04/30/2017 1134   BUN 21.2 03/13/2017 1143   CREATININE 1.0 04/30/2017 1134   CREATININE 0.9 03/13/2017 1143      Component Value Date/Time   CALCIUM 9.2 04/30/2017 1134   CALCIUM 9.5 03/13/2017 1143   ALKPHOS 68 04/30/2017 1134   ALKPHOS 80 03/13/2017 1143   AST 27 04/30/2017 1134   AST 21 03/13/2017 1143   ALT 19 04/30/2017 1134   ALT 13 03/13/2017 1143   BILITOT 0.70 04/30/2017 1134   BILITOT 0.50 03/13/2017 1143     Impression and Plan: Kristy Olson is a pleasant 83 yo white female with history of stage I tubulo-lobular carcinoma of the left breast with lumpectomy in August 2017.   So far, she is doing quite well.  I told her that she has to be on the Femara for 5 years. I did this would be reasonable for her.  She is on Fosamax. I think she is doing fairly well with Fosamax.  I think we get her back in 4 months now.       Volanda Napoleon, MD 9/10/201812:52 PM

## 2017-05-01 LAB — CANCER ANTIGEN 27.29: CAN 27.29: 24.3 U/mL (ref 0.0–38.6)

## 2017-05-14 ENCOUNTER — Other Ambulatory Visit: Payer: Self-pay | Admitting: Osteopathic Medicine

## 2017-06-07 ENCOUNTER — Other Ambulatory Visit: Payer: Self-pay | Admitting: Osteopathic Medicine

## 2017-06-07 DIAGNOSIS — I2510 Atherosclerotic heart disease of native coronary artery without angina pectoris: Secondary | ICD-10-CM

## 2017-07-26 ENCOUNTER — Encounter: Payer: Self-pay | Admitting: Emergency Medicine

## 2017-07-26 ENCOUNTER — Other Ambulatory Visit: Payer: Self-pay

## 2017-07-26 ENCOUNTER — Emergency Department
Admission: EM | Admit: 2017-07-26 | Discharge: 2017-07-26 | Disposition: A | Discharge status: Home / Self Care | Payer: Medicare Other | Source: Home / Self Care | Attending: Family Medicine | Admitting: Family Medicine

## 2017-07-26 DIAGNOSIS — M7989 Other specified soft tissue disorders: Secondary | ICD-10-CM

## 2017-07-26 DIAGNOSIS — L299 Pruritus, unspecified: Secondary | ICD-10-CM

## 2017-07-26 MED ORDER — HYDROCORTISONE 2.5 % EX LOTN
TOPICAL_LOTION | Freq: Two times a day (BID) | CUTANEOUS | 0 refills | Status: DC
Start: 2017-07-26 — End: 2018-11-02

## 2017-07-26 MED ORDER — PREDNISONE 20 MG PO TABS: 20.0000 mg | ORAL_TABLET | Freq: Every day | ORAL | 0 refills | Status: DC

## 2017-07-26 MED ORDER — CETIRIZINE HCL 5 MG PO TABS: 5.0000 mg | ORAL_TABLET | Freq: Every day | ORAL | 0 refills | Status: DC

## 2017-07-26 NOTE — ED Provider Notes (Signed)
[name]    CSN: [id number] Arrival date & time: 07/26/17  1606     History   Chief Complaint Chief Complaint  Patient presents with  . Pruritis    HPI Kristy Olson is a 83 y.o. female.   HPI Kristy Olson is a 83 y.o. female presenting to UC with c/o itching of her arms and legs for the last 1 week.  She has noticed slight increased swelling in both her legs with Right worse than left. Hx of venous status and notes she has been told itching is a side effect from that condition.  She is followed by wound care due an skin ulcer that has just recently healed.  She has applied Eucerin cream and her compression stockings w/o relief. Itching is worse at night. She has not tried any lotion on her arms because she "doesn't want to be too greasy"  She has been scratching and using "the hottest water" she can tolerate with temporary relief. Denies new soaps, lotions or medications. She has not tried any antihistamines or prescription lotions for itching. She called her dermatologist today but could not be seen today or tomorrow and is wanting some relief tonight.   Past Medical History:  Diagnosis Date  . Abnormal finding on MRI of brain   . Breast cancer of upper-inner quadrant of left female breast ([place]) 10/12/2015  . CAD (coronary artery disease)   . Chronic venous insufficiency    Compression stockings, Dr. Carmine Savoy to evaluate late May 2015   . Degeneration of lumbar or lumbosacral intervertebral disc   . History of thyroid cancer    With reported brain mets.   . Hyperlipidemia   . Hypothyroid    Outside records report synthroid 163mcg despite her report of 127mcg. 02/2014 awaiting clarification   . Idiopathic scoliosis   . Osteoporosis    July 2014 DEXA   . PNEUMONIA, COMMUNITY ACQUIRED, PNEUMOCOCCAL   . Venous stasis ulcer ([place])     Patient Active Problem List   Diagnosis Date Noted  . 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 Decreased thyroid stimulating hormone  (TSH) level 07/19/2016  . Filamentary keratitis of right eye 06/26/2016  . Keratoconjunctivitis sicca due to decreased tear production, bilateral 06/26/2016  . Blepharitis of both eyes 06/26/2016  . Atopic conjunctivitis of both eyes 06/26/2016  . Cerumen impaction 04/17/2016  . Hair loss 04/17/2016  . Breast cancer of upper-inner quadrant of left female breast ([place]) 10/12/2015  . Rhinorrhea 12/15/2014  . Chest pain 12/02/2014  . Hypothyroidism, postsurgical 07/21/2014  . CAD (coronary artery disease) 04/29/2014  . Abnormal finding on MRI of brain 04/14/2014  . Malignant neoplasm of thyroid gland ([place]) 04/14/2014  . History of thyroid cancer 03/27/2014  . Chronic venous insufficiency 12/23/2013  . Degeneration of lumbar or lumbosacral intervertebral disc 10/08/2013  . Idiopathic scoliosis 10/08/2013  . Osteoporosis 10/06/2013  . Essential hypertension, benign 08/18/2013  . Venous stasis ulcer of left lower extremity ([place]) 08/06/2013  . Grief 08/06/2013  . Hyperlipidemia 08/15/2010    Past Surgical History:  Procedure Laterality Date  . BREAST LUMPECTOMY WITH RADIOACTIVE SEED LOCALIZATION Left 03/29/2016   Procedure: LEFT BREAST LUMPECTOMY WITH RADIOACTIVE SEED LOCALIZATION;  Surgeon: Autumn Messing III, MD;  Location: [facility];  Service: General;  Laterality: Left;  LEFT BREAST LUMPECTOMY WITH RADIOACTIVE SEED LOCALIZATION  . cranotomy    . THYROIDECTOMY      OB History    No data available  Home Medications    Prior to Admission medications   Medication Sig Start Date End Date Taking? Authorizing Provider  alendronate (FOSAMAX) 70 MG tablet TAKE 1 TABLET BY MOUTH EVERY 7 DAYS WITH A FULL GLASS OF WATER AND ON AN EMPTY STOMACH 02/21/16   Renato Shin, MD  aspirin 81 MG tablet Take 81 mg by mouth daily.    [provider]  cetirizine (ZYRTEC) 5 MG tablet Take 1 tablet (5 mg total) by mouth daily. 07/26/17   Noe Gens, PA-C  cholecalciferol  (VITAMIN D) 1000 units tablet Take 1,000 Units by mouth daily. Reported on 01/05/2016    [provider]  hydrocortisone 2.5 % lotion Apply topically 2 (two) times daily. 07/26/17   Noe Gens, PA-C  letrozole [facility]) 2.5 MG tablet Take 1 tablet (2.5 mg total) by mouth daily. 07/17/16   Volanda Napoleon, MD  levothyroxine (SYNTHROID, LEVOTHROID) 88 MCG tablet TAKE 1 TABLET BY MOUTH DAILY BEFORE BREAKFAST 05/14/17   Emeterio Reeve, DO  Lifitegrast [facility]) 5 % SOLN Apply 1 drop to eye 2 (two) times daily. 06/26/16   Emeterio Reeve, DO  metoprolol succinate (TOPROL-XL) 25 MG 24 hr tablet TAKE 1 TABLET(25 MG) BY MOUTH DAILY. FOLLOW UP APPOINTMENT 06/07/17   Emeterio Reeve, DO  predniSONE (DELTASONE) 20 MG tablet Take 1 tablet (20 mg total) by mouth daily with breakfast. For 5 days 07/26/17   Noe Gens, PA-C  Red Yeast Rice 600 MG TABS Four by mouth daily to help lower cholesterol 01/05/16   Marcial Pacas, DO    Family History Family History  Problem Relation Age of Onset  . CAD Sister   . Cancer Sister   . Heart disease Sister   . Diabetes Father   . COPD Father     Social History Social History   Tobacco Use  . Smoking status: Never Smoker  . Smokeless tobacco: Never Used  Substance Use Topics  . Alcohol use: Yes    Alcohol/week: 0.0 oz    Comment: Occasional  . Drug use: No     Allergies   Neomy-bacit-polymyx-pramoxine; Red dye; Levaquin  [levofloxacin in d5w]; Amoxicillin; Ciprofloxacin; Colesevelam; Doxycycline; Levofloxacin; Metoprolol; Neomycin; Other; Statins; and Tape   Review of Systems Review of Systems  Constitutional: Negative for chills and fever.  Skin: Negative for rash.       itching  Neurological: Negative for weakness and numbness.     Physical Exam Triage Vital Signs ED Triage Vitals  Enc Vitals Group     BP 07/26/17 1620 131/76     Pulse Rate 07/26/17 1620 71     Resp --      Temp 07/26/17 1620 98.1 F (36.7 C)     Temp  Source 07/26/17 1620 Oral     SpO2 07/26/17 1620 98 %     Weight 07/26/17 1620 131 lb (59.4 kg)     Height 07/26/17 1620 5\' 7"  (1.702 m)     Head Circumference --      Peak Flow --      Pain Score 07/26/17 1621 0     Pain Loc --      Pain Edu? --      Excl. in [place]? --    No data found.  Updated Vital Signs BP 131/76 (BP Location: Right Arm)   Pulse 71   Temp 98.1 F (36.7 C) (Oral)   Ht 5\' 7"  (1.702 m)   Wt 131 lb (59.4 kg)  SpO2 98%   BMI 20.52 kg/m   Visual Acuity Right Eye Distance:   Left Eye Distance:   Bilateral Distance:    Right Eye Near:   Left Eye Near:    Bilateral Near:     Physical Exam  Constitutional: She is oriented to person, place, and time. She appears well-developed and well-nourished. No distress.  HENT:  Head: Normocephalic and atraumatic.  Eyes: EOM are normal.  Neck: Normal range of motion.  Cardiovascular: Normal rate.  Pulmonary/Chest: Effort normal. No respiratory distress.  Musculoskeletal: Normal range of motion. She exhibits edema.  Bilateral lower legs: 1+ pitting edema.  Neurological: She is alert and oriented to person, place, and time.  Skin: Skin is warm and dry. No rash noted. She is not diaphoretic. There is erythema.  Bilateral forearms: dried faint erythematous skin, ares of excoriation. No specific rash.  Bilateral legs: dried skin with areas of excoriation, and darkened red/purple hue of skin.   Psychiatric: She has a normal mood and affect. Her behavior is normal.  Nursing note and vitals reviewed.    UC Treatments / Results  Labs (all labs ordered are listed, but only abnormal results are displayed) Labs Reviewed - No data to display  EKG  EKG Interpretation None       Radiology No results found.  Procedures Procedures (including critical care time)  Medications Ordered in UC Medications - No data to display   Initial Impression / Assessment and Plan / UC Course  I have reviewed the triage vital  signs and the nursing notes.  Pertinent labs & imaging results that were available during my care of the patient were reviewed by me and considered in my medical decision making (see chart for details).     Itching likely due to dried skin from cold weather as well as hx of venous stasis. Will try 5 days of oral prednisone 20mg  daily Hydrocortisone cream for more pruritic areas and once daily cetirizine 5mg  PO Discouraged use of hot water as this can dry out skin even more, causing itch to worsen. F/u with PCP later next week for recheck of symptoms.   Final Clinical Impressions(s) / UC Diagnoses   Final diagnoses:  Itching  Leg swelling    ED Discharge Orders        Ordered    predniSONE (DELTASONE) 20 MG tablet  Daily with breakfast     07/26/17 1641    hydrocortisone 2.5 % lotion  2 times daily     07/26/17 1641    cetirizine (ZYRTEC) 5 MG tablet  Daily     07/26/17 1641       Controlled Substance Prescriptions [place] Controlled Substance Registry consulted? Not Applicable   Tyrell Antonio 07/26/17 1731

## 2017-07-26 NOTE — ED Triage Notes (Signed)
Itching all over, legs and arms x 1 week, denies new meds, soaps or detergents, has not noticed a rash

## 2017-08-03 ENCOUNTER — Encounter: Payer: Self-pay | Admitting: Osteopathic Medicine

## 2017-08-03 ENCOUNTER — Ambulatory Visit (INDEPENDENT_AMBULATORY_CARE_PROVIDER_SITE_OTHER): Payer: Medicare Other | Admitting: Osteopathic Medicine

## 2017-08-03 VITALS — BP 151/60 | HR 66 | Wt 132.0 lb

## 2017-08-03 DIAGNOSIS — Z13220 Encounter for screening for lipoid disorders: Secondary | ICD-10-CM

## 2017-08-03 DIAGNOSIS — L853 Xerosis cutis: Secondary | ICD-10-CM

## 2017-08-03 NOTE — Patient Instructions (Signed)
Lotions to look for:  CeraVe  Aveeno Eczema Apply after cool shower and/or 2-3 times per day  Continue the cetirizine antihistamines Continue the hydrocortisone steroid cream for severe itching

## 2017-08-03 NOTE — Progress Notes (Signed)
HPI: Kristy Olson is a 83 y.o. female who  has a past medical history of Abnormal finding on MRI of brain, Breast cancer of upper-inner quadrant of left female breast ([place]) (10/12/2015), CAD (coronary artery disease), Chronic venous insufficiency, Degeneration of lumbar or lumbosacral intervertebral disc, History of thyroid cancer, Hyperlipidemia, Hypothyroid, Idiopathic scoliosis, Osteoporosis, PNEUMONIA, COMMUNITY ACQUIRED, PNEUMOCOCCAL, and Venous stasis ulcer ([place]).  she presents to [facility] today, 08/03/17,  for chief complaint of:  Chief Complaint  Patient presents with  . Rash    Seen at urgent care 07/26/17 for itching x1 week arms and legs, hx similar with venous stasis dermatitis. Meds from UC initially helped but symptoms have returned - looks like she was given 5 days prednisone po, hydrocortisone cream and daily Zyrtec. Though at that time from [facility] Dr was itching likely d/t dried skin and venous stasis dermatitis.   Today c/o persistent itching. No rash.     Past medical, surgical, social and family history reviewed:  Patient Active Problem List   Diagnosis Date Noted  . 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 Decreased thyroid stimulating hormone (TSH) level 07/19/2016  . Filamentary keratitis of right eye 06/26/2016  . Keratoconjunctivitis sicca due to decreased tear production, bilateral 06/26/2016  . Blepharitis of both eyes 06/26/2016  . Atopic conjunctivitis of both eyes 06/26/2016  . Cerumen impaction 04/17/2016  . Hair loss 04/17/2016  . 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 Rhinorrhea 12/15/2014  . Chest pain 12/02/2014  . Hypothyroidism, postsurgical 07/21/2014  . CAD (coronary artery disease) 04/29/2014  . Abnormal finding on MRI of brain 04/14/2014  . Malignant neoplasm of thyroid gland ([place]) 04/14/2014  . History of thyroid cancer 03/27/2014  . Chronic venous insufficiency 12/23/2013   . Degeneration of lumbar or lumbosacral intervertebral disc 10/08/2013  . Idiopathic scoliosis 10/08/2013  . Osteoporosis 10/06/2013  . Essential hypertension, benign 08/18/2013  . Venous stasis ulcer of left lower extremity ([place]) 08/06/2013  . Grief 08/06/2013  . Hyperlipidemia 08/15/2010    Past Surgical History:  Procedure Laterality Date  . BREAST LUMPECTOMY WITH RADIOACTIVE SEED LOCALIZATION Left 03/29/2016   Procedure: LEFT BREAST LUMPECTOMY WITH RADIOACTIVE SEED LOCALIZATION;  Surgeon: Autumn Messing III, MD;  Location: [place];  Service: General;  Laterality: Left;  LEFT BREAST LUMPECTOMY WITH RADIOACTIVE SEED LOCALIZATION  . cranotomy    . THYROIDECTOMY      Social History   Tobacco Use  . Smoking status: Never Smoker  . Smokeless tobacco: Never Used  Substance Use Topics  . Alcohol use: Yes    Alcohol/week: 0.0 oz    Comment: Occasional    Family History  Problem Relation Age of Onset  . CAD Sister   . Cancer Sister   . Heart disease Sister   . Diabetes Father   . COPD Father      Current medication list and allergy/intolerance information reviewed:    Current Outpatient Medications  Medication Sig Dispense Refill  . alendronate (FOSAMAX) 70 MG tablet TAKE 1 TABLET BY MOUTH EVERY 7 DAYS WITH A FULL GLASS OF WATER AND ON AN EMPTY STOMACH 12 tablet 0  . aspirin 81 MG tablet Take 81 mg by mouth daily.    . cetirizine (ZYRTEC) 5 MG tablet Take 1 tablet (5 mg total) by mouth daily. 30 tablet 0  . cholecalciferol (VITAMIN D) 1000 units tablet Take 1,000 Units by mouth daily. Reported on 01/05/2016    .  hydrocortisone 2.5 % lotion Apply topically 2 (two) times daily. 59 mL 0  . letrozole (FEMARA) 2.5 MG tablet Take 1 tablet (2.5 mg total) by mouth daily. 90 tablet 4  . levothyroxine (SYNTHROID, LEVOTHROID) 88 MCG tablet TAKE 1 TABLET BY MOUTH DAILY BEFORE BREAKFAST 90 tablet 0  . Lifitegrast (XIIDRA) 5 % SOLN Apply 1 drop to eye 2 (two) times daily. 1  each 0  . metoprolol succinate (TOPROL-XL) 25 MG 24 hr tablet TAKE 1 TABLET(25 MG) BY MOUTH DAILY. FOLLOW UP APPOINTMENT 90 tablet 0  . predniSONE (DELTASONE) 20 MG tablet Take 1 tablet (20 mg total) by mouth daily with breakfast. For 5 days 5 tablet 0  . Red Yeast Rice 600 MG TABS Four by mouth daily to help lower cholesterol 120 tablet 11   No current facility-administered medications for this visit.     Allergies  Allergen Reactions  . Neomy-Bacit-Polymyx-Pramoxine Itching  . Red Dye Itching    Red frosting caused itching and facial swelling  . Levaquin  [Levofloxacin In D5w] Other (See Comments)  . Amoxicillin   . Ciprofloxacin   . Colesevelam   . Doxycycline   . Levofloxacin   . Metoprolol Other (See Comments)  . Neomycin     Allergic reaction only topically.  . Other Itching    curad triple antibiotic ointment.  . Statins     Hives  . Tape       Review of Systems:  Constitutional:  No  fever, no chills,   Cardiac: No  chest pain, No  pressure  Respiratory:  No  shortness of breath.  Musculoskeletal: No new myalgia/arthralgia  Skin: +as per HPI  Hem/Onc: No  easy bruising/bleeding   Exam:  BP (!) 151/60   Pulse 66   Wt 132 lb 0.6 oz (59.9 kg)   BMI 20.68 kg/m   Constitutional: VS see above. General Appearance: alert, well-developed, well-nourished, NAD  Eyes: Normal lids and conjunctive  Ears, Nose, Mouth, Throat: MMM, Normal external inspection ears/nares/mouth/lips/gums.   Neck: No masses, trachea midline.  Respiratory: Normal respiratory effort.   Cardiovascular:Trace LE edema   Musculoskeletal: Gait normal.   Neurological: Normal balance/coordination. No tremor.  Skin: quite dry, no erythema or rash   Psychiatric: Normal judgment/insight. Normal mood and affect. Oriented x3.      ASSESSMENT/PLAN:   Dry skin dermatitis - po steroids probably helped but didn't correct the dry skin - trial emollients lotion and refer to derm if needed    Lipid screening - Plan: Lipid panel    Patient Instructions  Lotions to look for:  CeraVe  Aveeno Eczema Apply after cool shower and/or 2-3 times per day  Continue the cetirizine antihistamines Continue the hydrocortisone steroid cream for severe itching     Visit summary with medication list and pertinent instructions was printed for patient to review. All questions at time of visit were answered - patient instructed to contact office with any additional concerns. ER/RTC precautions were reviewed with the patient.   Follow-up plan: Return if symptoms worsen or fail to improve, call us and we can send a referral to dermatology .  Note: Total time spent 25 minutes, greater than 50% of the visit was spent face-to-face counseling and coordinating care for the following: The primary encounter diagnosis was Dry skin dermatitis. A diagnosis of Lipid screening was also pertinent to this visit.Marland Kitchen  Please note: voice recognition software was used to produce this document, and typos may escape review. Please contact Dr.  Yailene Badia for any needed clarifications.

## 2017-08-04 LAB — LIPID PANEL
CHOLESTEROL: 251 mg/dL — AB (ref <200)
HDL: 59 mg/dL (ref >50)
LDL Cholesterol (Calc): 162 mg/dL (calc) — ABNORMAL HIGH
NON-HDL CHOLESTEROL (CALC): 192 mg/dL — AB (ref <130)
TRIGLYCERIDES: 154 mg/dL — AB (ref <150)
Total CHOL/HDL Ratio: 4.3 (calc) (ref <5.0)

## 2017-08-17 DIAGNOSIS — M8580 Other specified disorders of bone density and structure, unspecified site: Secondary | ICD-10-CM | POA: Insufficient documentation

## 2017-08-17 DIAGNOSIS — Z9889 Other specified postprocedural states: Secondary | ICD-10-CM | POA: Insufficient documentation

## 2017-08-17 DIAGNOSIS — Z9089 Acquired absence of other organs: Secondary | ICD-10-CM | POA: Insufficient documentation

## 2017-08-17 DIAGNOSIS — C439 Malignant melanoma of skin, unspecified: Secondary | ICD-10-CM | POA: Insufficient documentation

## 2017-08-17 DIAGNOSIS — M959 Acquired deformity of musculoskeletal system, unspecified: Secondary | ICD-10-CM | POA: Insufficient documentation

## 2017-08-17 DIAGNOSIS — E039 Hypothyroidism, unspecified: Secondary | ICD-10-CM | POA: Insufficient documentation

## 2017-08-17 DIAGNOSIS — I10 Essential (primary) hypertension: Secondary | ICD-10-CM | POA: Insufficient documentation

## 2017-08-17 DIAGNOSIS — E890 Postprocedural hypothyroidism: Secondary | ICD-10-CM | POA: Insufficient documentation

## 2017-08-17 DIAGNOSIS — C4490 Unspecified malignant neoplasm of skin, unspecified: Secondary | ICD-10-CM | POA: Insufficient documentation

## 2017-08-28 ENCOUNTER — Other Ambulatory Visit: Payer: Self-pay | Admitting: Hematology & Oncology

## 2017-08-28 DIAGNOSIS — C73 Malignant neoplasm of thyroid gland: Secondary | ICD-10-CM

## 2017-08-28 DIAGNOSIS — C50112 Malignant neoplasm of central portion of left female breast: Secondary | ICD-10-CM

## 2017-09-03 ENCOUNTER — Inpatient Hospital Stay: Payer: Medicare Other | Admitting: Hematology & Oncology

## 2017-09-03 ENCOUNTER — Inpatient Hospital Stay: Payer: Medicare Other

## 2017-10-11 ENCOUNTER — Inpatient Hospital Stay: Payer: Medicare Other | Attending: Hematology & Oncology

## 2017-10-11 ENCOUNTER — Other Ambulatory Visit: Payer: Self-pay

## 2017-10-11 ENCOUNTER — Inpatient Hospital Stay (HOSPITAL_BASED_OUTPATIENT_CLINIC_OR_DEPARTMENT_OTHER): Payer: Medicare Other | Admitting: Hematology & Oncology

## 2017-10-11 ENCOUNTER — Encounter: Payer: Self-pay | Admitting: Hematology & Oncology

## 2017-10-11 VITALS — BP 109/44 | HR 72 | Temp 97.6°F | Resp 18 | Wt 126.0 lb

## 2017-10-11 DIAGNOSIS — C50912 Malignant neoplasm of unspecified site of left female breast: Secondary | ICD-10-CM | POA: Insufficient documentation

## 2017-10-11 DIAGNOSIS — Z79811 Long term (current) use of aromatase inhibitors: Secondary | ICD-10-CM | POA: Insufficient documentation

## 2017-10-11 DIAGNOSIS — Z170 Estrogen receptor positive status [ER+]: Secondary | ICD-10-CM | POA: Diagnosis not present

## 2017-10-11 DIAGNOSIS — Z79899 Other long term (current) drug therapy: Secondary | ICD-10-CM

## 2017-10-11 DIAGNOSIS — M818 Other osteoporosis without current pathological fracture: Secondary | ICD-10-CM

## 2017-10-11 DIAGNOSIS — C73 Malignant neoplasm of thyroid gland: Secondary | ICD-10-CM

## 2017-10-11 DIAGNOSIS — Z7982 Long term (current) use of aspirin: Secondary | ICD-10-CM | POA: Diagnosis not present

## 2017-10-11 DIAGNOSIS — C439 Malignant melanoma of skin, unspecified: Secondary | ICD-10-CM | POA: Insufficient documentation

## 2017-10-11 LAB — CMP (CANCER CENTER ONLY)
ALBUMIN: 3.8 g/dL (ref 3.5–5.0)
ALT: 10 U/L (ref 0–55)
ANION GAP: 8 (ref 3–11)
AST: 17 U/L (ref 5–34)
Alkaline Phosphatase: 72 U/L (ref 40–150)
BILIRUBIN TOTAL: 0.5 mg/dL (ref 0.2–1.2)
BUN: 23 mg/dL (ref 7–26)
CHLORIDE: 104 mmol/L (ref 98–109)
CO2: 28 mmol/L (ref 22–29)
Calcium: 9.6 mg/dL (ref 8.4–10.4)
Creatinine: 0.92 mg/dL (ref 0.60–1.10)
GFR, Est AFR Am: >60 mL/min (ref >60)
GFR, Estimated: 56 mL/min — ABNORMAL LOW (ref >60)
GLUCOSE: 88 mg/dL (ref 70–140)
POTASSIUM: 4.4 mmol/L (ref 3.5–5.1)
Sodium: 140 mmol/L (ref 136–145)
TOTAL PROTEIN: 7 g/dL (ref 6.4–8.3)

## 2017-10-11 LAB — CBC WITH DIFFERENTIAL (CANCER CENTER ONLY)
Basophils Absolute: 0 K/uL (ref 0.0–0.1)
Basophils Relative: 0 %
Eosinophils Absolute: 0.2 K/uL (ref 0.0–0.5)
Eosinophils Relative: 3 %
HCT: 34.9 % (ref 34.8–46.6)
Hemoglobin: 11.6 g/dL (ref 11.6–15.9)
Lymphocytes Relative: 32 %
Lymphs Abs: 1.5 K/uL (ref 0.9–3.3)
MCH: 31.1 pg (ref 26.0–34.0)
MCHC: 33.2 g/dL (ref 32.0–36.0)
MCV: 93.6 fL (ref 81.0–101.0)
Monocytes Absolute: 0.5 K/uL (ref 0.1–0.9)
Monocytes Relative: 10 %
Neutro Abs: 2.7 K/uL (ref 1.5–6.5)
Neutrophils Relative %: 55 %
Platelet Count: 172 K/uL (ref 145–400)
RBC: 3.73 MIL/uL (ref 3.70–5.32)
RDW: 13.4 % (ref 11.1–15.7)
WBC Count: 4.8 K/uL (ref 3.9–10.0)

## 2017-10-12 LAB — VITAMIN D 25 HYDROXY (VIT D DEFICIENCY, FRACTURES): Vit D, 25-Hydroxy: 51.2 ng/mL (ref 30.0–100.0)

## 2017-10-15 NOTE — Progress Notes (Signed)
Hematology and Oncology Follow Up Visit  Kristy Olson [id number] 05-14-1932 84 y.o. 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  Surprisingly enough, she had a lot of surgery for her face.  She apparently had a melanoma on the right side of her face.  This is close to her nose.  She required fairly extensive plastic surgery.  She had skin grafts done.  The sutures are still in place.  I think she goes back to see the plastic surgeon in a week or so.  Otherwise, she is doing well.  She is now in a new house.  This is working out well for her.  She has had no problems with eating.  She has had no problems with nausea or vomiting.  Is been no change in bowel or bladder habits.  She has had no fever.  She has had no leg swelling.  She has had no bleeding.  Thankfully, her weight really has not gone down despite this extensive facial surgery.  She is doing well on the Femara.  Currently, her performance status is ECOG 2.   Medications:  Allergies as of 10/11/2017      Reactions   Neomy-bacit-polymyx-pramoxine Itching   Red Dye Itching   Red frosting caused itching and facial swelling   Levaquin  [levofloxacin In D5w] Other (See Comments)   Metoprolol Other (See Comments)   Amoxicillin    Ciprofloxacin    Colesevelam    Doxycycline    Levofloxacin    Neomycin    Allergic reaction only topically.   Other Itching   curad triple antibiotic ointment.   Statins Hives, Other (See Comments)   Hives   Tape Hives      Medication List        Accurate as of 10/11/17 11:59 PM. Always use your most recent med list.          alendronate 70 MG tablet Commonly known as:  FOSAMAX TAKE 1 TABLET BY MOUTH EVERY 7 DAYS WITH A FULL GLASS OF WATER AND ON AN EMPTY STOMACH     aspirin 81 MG tablet Take 81 mg by mouth daily.   cetirizine 5 MG tablet Commonly known as:  ZYRTEC Take 1 tablet (5 mg total) by mouth daily.   cholecalciferol 1000 units tablet Commonly known as:  VITAMIN D Take 1,000 Units by mouth daily. Reported on 01/05/2016   hydrocortisone 2.5 % lotion Apply topically 2 (two) times daily.   letrozole 2.5 MG tablet Commonly known as:  FEMARA TAKE 1 TABLET(2.5 MG) BY MOUTH DAILY   levothyroxine 88 MCG tablet Commonly known as:  SYNTHROID, LEVOTHROID TAKE 1 TABLET BY MOUTH DAILY BEFORE BREAKFAST   Lifitegrast 5 % Soln Commonly known as:  XIIDRA Apply 1 drop to eye 2 (two) times daily.   metoprolol succinate 25 MG 24 hr tablet Commonly known as:  TOPROL-XL TAKE 1 TABLET(25 MG) BY MOUTH DAILY. FOLLOW UP APPOINTMENT   predniSONE 20 MG tablet Commonly known as:  DELTASONE Take 1 tablet (20 mg total) by mouth daily with breakfast. For 5 days   Red Yeast Rice 600 MG Tabs Four by mouth daily to help lower cholesterol       Allergies:  Allergies  Allergen Reactions  . Neomy-Bacit-Polymyx-Pramoxine Itching  .  Red Dye Itching    Red frosting caused itching and facial swelling  . Levaquin  [Levofloxacin In D5w] Other (See Comments)  . Metoprolol Other (See Comments)  . Amoxicillin   . Ciprofloxacin   . Colesevelam   . Doxycycline   . Levofloxacin   . Neomycin     Allergic reaction only topically.  . Other Itching    curad triple antibiotic ointment.  . Statins Hives and Other (See Comments)    Hives  . Tape Hives    Past Medical History, Surgical history, Social history, and Family History were reviewed and updated.  Review of Systems: Review of Systems  Constitutional: Negative.   HENT: Positive for congestion and sinus pain.   Eyes: Negative.   Respiratory: Negative.   Cardiovascular: Positive for palpitations.  Gastrointestinal: Negative.   Genitourinary: Negative.   Musculoskeletal: Negative.   Skin: Negative.    Neurological: Negative.   Endo/Heme/Allergies: Negative.   Psychiatric/Behavioral: Negative.     Physical Exam:  weight is 126 lb (57.2 kg). Her oral temperature is 97.6 F (36.4 C). Her blood pressure is 109/44 (abnormal) and her pulse is 72. Her respiration is 18 and oxygen saturation is 98%.   Wt Readings from Last 3 Encounters:  10/11/17 126 lb (57.2 kg)  08/03/17 132 lb 0.6 oz (59.9 kg)  07/26/17 131 lb (59.4 kg)    Physical Exam  Constitutional: She is oriented to person, place, and time.  HENT:  Head: Normocephalic and atraumatic.  Mouth/Throat: Oropharynx is clear and moist.  Eyes: EOM are normal. Pupils are equal, round, and reactive to light.  Neck: Normal range of motion.  Cardiovascular: Normal rate, regular rhythm and normal heart sounds.  Pulmonary/Chest: Effort normal and breath sounds normal.  Abdominal: Soft. Bowel sounds are normal.  Musculoskeletal: Normal range of motion. She exhibits no edema, tenderness or deformity.  Lymphadenopathy:    She has no cervical adenopathy.  Neurological: She is alert and oriented to person, place, and time.  Skin: Skin is warm and dry. No rash noted. No erythema.  Psychiatric: She has a normal mood and affect. Her behavior is normal. Judgment and thought content normal.  Vitals reviewed.    Lab Results  Component Value Date   WBC 4.8 10/11/2017   HGB 12.4 04/30/2017   HCT 34.9 10/11/2017   MCV 93.6 10/11/2017   PLT 172 10/11/2017   Lab Results  Component Value Date   FERRITIN 74 04/30/2017   IRON 95 04/30/2017   TIBC 366 04/30/2017   UIBC 271 04/30/2017   IRONPCTSAT 26 04/30/2017   Lab Results  Component Value Date   RBC 3.73 10/11/2017   No results found for: KPAFRELGTCHN, LAMBDASER, KAPLAMBRATIO No results found for: IGGSERUM, IGA, IGMSERUM No results found for: Odetta Pink, SPEI   Chemistry      Component Value Date/Time   NA 140 10/11/2017 1125    NA 144 04/30/2017 1134   NA 141 03/13/2017 1143   K 4.4 10/11/2017 1125   K 3.8 04/30/2017 1134   K 4.4 03/13/2017 1143   CL 104 10/11/2017 1125   CL 102 04/30/2017 1134   CO2 28 10/11/2017 1125   CO2 31 04/30/2017 1134   CO2 26 03/13/2017 1143   BUN 23 10/11/2017 1125   BUN 18 04/30/2017 1134   BUN 21.2 03/13/2017 1143   CREATININE 0.92 10/11/2017 1125   CREATININE 1.0 04/30/2017 1134   CREATININE 0.9 03/13/2017 1143  Component Value Date/Time   CALCIUM 9.6 10/11/2017 1125   CALCIUM 9.2 04/30/2017 1134   CALCIUM 9.5 03/13/2017 1143   ALKPHOS 72 10/11/2017 1125   ALKPHOS 68 04/30/2017 1134   ALKPHOS 80 03/13/2017 1143   AST 17 10/11/2017 1125   AST 21 03/13/2017 1143   ALT 10 10/11/2017 1125   ALT 19 04/30/2017 1134   ALT 13 03/13/2017 1143   BILITOT 0.5 10/11/2017 1125   BILITOT 0.50 03/13/2017 1143     Impression and Plan: Kristy Olson is a pleasant 84 yo white female with history of stage I tubulo-lobular carcinoma of the left breast with lumpectomy in August 2017.   Right now, everything looks great with respect to her breast cancer.  I just do not think that breast cancer recurrence is going to be a problem for her.  I am just glad that she had his melanoma taking care.  This was taken care of aggressively which would make a lot of sense to me.    I will plan to get her back to see [place] in 3 months.  If all looks good in 3 months, then maybe we can go 6 months for follow-up.       Volanda Napoleon, MD 2/25/20193:09 PM

## 2017-10-19 ENCOUNTER — Ambulatory Visit (INDEPENDENT_AMBULATORY_CARE_PROVIDER_SITE_OTHER): Payer: Medicare Other | Admitting: Osteopathic Medicine

## 2017-10-19 ENCOUNTER — Encounter: Payer: Self-pay | Admitting: Osteopathic Medicine

## 2017-10-19 VITALS — BP 144/64 | HR 61 | Temp 97.5°F | Wt 124.1 lb

## 2017-10-19 DIAGNOSIS — Z299 Encounter for prophylactic measures, unspecified: Secondary | ICD-10-CM

## 2017-10-19 DIAGNOSIS — M959 Acquired deformity of musculoskeletal system, unspecified: Secondary | ICD-10-CM | POA: Diagnosis not present

## 2017-10-19 DIAGNOSIS — E785 Hyperlipidemia, unspecified: Secondary | ICD-10-CM | POA: Diagnosis not present

## 2017-10-19 DIAGNOSIS — L853 Xerosis cutis: Secondary | ICD-10-CM | POA: Diagnosis not present

## 2017-10-19 DIAGNOSIS — Z9889 Other specified postprocedural states: Secondary | ICD-10-CM

## 2017-10-19 DIAGNOSIS — L988 Other specified disorders of the skin and subcutaneous tissue: Secondary | ICD-10-CM

## 2017-10-19 NOTE — Progress Notes (Signed)
HPI: Kristy Olson is a 84 y.o. female who  has a past medical history of Abnormal finding on MRI of brain, Breast cancer of upper-inner quadrant of left female breast ([place]) (10/12/2015), CAD (coronary artery disease), Chronic venous insufficiency, Degeneration of lumbar or lumbosacral intervertebral disc, History of thyroid cancer, Hyperlipidemia, Hypothyroid, Idiopathic scoliosis, Osteoporosis, PNEUMONIA, COMMUNITY ACQUIRED, PNEUMOCOCCAL, and Venous stasis ulcer ([place]).  she presents to [facility] today, 10/19/17,  for chief complaint of: Just a few questions  Had this visit on her schedule, on my record reviewed which she is really due until Medicare annual wellness in June but she has a few questions while she is here so we will go ahead and have discussion about these.  She recently underwent facial reconstructive surgery status post skin cancer/Mohs surgery. Recovering okay. She is certainly self-conscious about the scars but is minimal. She just wanted to give me an update on that.  Have some questions about cholesterol issues. Apparently she was not supposed to take any Fish oil surrounding the time of her surgery, so she has held off on taking this. History of high cholesterol, statin intolerance. She is back on fish oil now.  She has never had Pap smear or colonoscopy, she has some questions about whether or not these are things that she needs.  Dry skin dermatitis is doing a bit better, seems to come and go. Lotion is helping when she needs it but she is not necessarily using it every day.   Past medical, surgical, social and family history reviewed:  Patient Active Problem List   Diagnosis Date Noted  . 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 Decreased thyroid stimulating hormone (TSH) level 07/19/2016  . Filamentary keratitis of right eye 06/26/2016  . Keratoconjunctivitis sicca due to decreased tear production, bilateral 06/26/2016  . Blepharitis  of both eyes 06/26/2016  . Atopic conjunctivitis of both eyes 06/26/2016  . Cerumen impaction 04/17/2016  . Hair loss 04/17/2016  . Breast cancer of upper-inner quadrant of left female breast ([place]) 10/12/2015  . Rhinorrhea 12/15/2014  . Chest pain 12/02/2014  . Hypothyroidism, postsurgical 07/21/2014  . CAD (coronary artery disease) 04/29/2014  . Abnormal finding on MRI of brain 04/14/2014  . Malignant neoplasm of thyroid gland ([place]) 04/14/2014  . History of thyroid cancer 03/27/2014  . Chronic venous insufficiency 12/23/2013  . Degeneration of lumbar or lumbosacral intervertebral disc 10/08/2013  . Idiopathic scoliosis 10/08/2013  . Osteoporosis 10/06/2013  . Essential hypertension, benign 08/18/2013  . Venous stasis ulcer of left lower extremity ([place]) 08/06/2013  . Grief 08/06/2013  . Hyperlipidemia 08/15/2010    Past Surgical History:  Procedure Laterality Date  . BREAST LUMPECTOMY WITH RADIOACTIVE SEED LOCALIZATION Left 03/29/2016   Procedure: LEFT BREAST LUMPECTOMY WITH RADIOACTIVE SEED LOCALIZATION;  Surgeon: Autumn Messing III, MD;  Location: [facility];  Service: General;  Laterality: Left;  LEFT BREAST LUMPECTOMY WITH RADIOACTIVE SEED LOCALIZATION  . cranotomy    . THYROIDECTOMY      Social History   Tobacco Use  . Smoking status: Never Smoker  . Smokeless tobacco: Never Used  Substance Use Topics  . Alcohol use: Yes    Alcohol/week: 0.0 oz    Comment: Occasional    Family History  Problem Relation Age of Onset  . CAD Sister   . Cancer Sister   . Heart disease Sister   . Diabetes Father   . COPD Father      Current medication list and allergy/intolerance  information reviewed:    Current Outpatient Medications  Medication Sig Dispense Refill  . alendronate (FOSAMAX) 70 MG tablet TAKE 1 TABLET BY MOUTH EVERY 7 DAYS WITH A FULL GLASS OF WATER AND ON AN EMPTY STOMACH 12 tablet 0  . aspirin 81 MG tablet Take 81 mg by mouth daily.    . cetirizine  (ZYRTEC) 5 MG tablet Take 1 tablet (5 mg total) by mouth daily. 30 tablet 0  . cholecalciferol (VITAMIN D) 1000 units tablet Take 1,000 Units by mouth daily. Reported on 01/05/2016    . hydrocortisone 2.5 % lotion Apply topically 2 (two) times daily. 59 mL 0  . letrozole (FEMARA) 2.5 MG tablet TAKE 1 TABLET(2.5 MG) BY MOUTH DAILY 90 tablet 0  . levothyroxine (SYNTHROID, LEVOTHROID) 88 MCG tablet TAKE 1 TABLET BY MOUTH DAILY BEFORE BREAKFAST 90 tablet 0  . Lifitegrast (XIIDRA) 5 % SOLN Apply 1 drop to eye 2 (two) times daily. 1 each 0  . metoprolol succinate (TOPROL-XL) 25 MG 24 hr tablet TAKE 1 TABLET(25 MG) BY MOUTH DAILY. FOLLOW UP APPOINTMENT 90 tablet 0  . Red Yeast Rice 600 MG TABS Four by mouth daily to help lower cholesterol 120 tablet 11   No current facility-administered medications for this visit.     Allergies  Allergen Reactions  . Neomy-Bacit-Polymyx-Pramoxine Itching  . Red Dye Itching and Hives    Red frosting caused itching and facial swelling  . Levaquin  [Levofloxacin In D5w] Other (See Comments)  . Metoprolol Other (See Comments)    Other reaction(s): unknown  . Amoxicillin Hives  . Ciprofloxacin   . Colesevelam Hives  . Doxycycline Hives  . Levofloxacin   . Neomycin Hives    Allergic reaction only topically. Only topically  . Other Itching    curad triple antibiotic ointment. Curad Triple Antibiotic Ointment  . Statins Hives and Other (See Comments)    Hives Other reaction(s): unknown  . Tape Hives and Itching      Review of Systems:  Constitutional:  No  fever, no chills, No recent illness  HEENT: No  headache  Cardiac: No  chest pain  Respiratory:  No  shortness of breath.   Gastrointestinal: No  abdominal pain   Neurologic: No  weakness, No  dizziness  Psychiatric: No  concerns with depression, No  concerns with anxiety,   Exam:  BP (!) 144/64   Pulse 61   Temp (!) 97.5 F (36.4 C) (Oral)   Wt 124 lb 1.9 oz (56.3 kg)   BMI 19.44 kg/m    Constitutional: VS see above. General Appearance: alert, well-developed, well-nourished, NAD  Eyes: Normal lids and conjunctive, non-icteric sclera  Ears, Nose, Mouth, Throat: MMM, Normal external inspection ears/nares/mouth/lips/gums.   Neck: No masses, trachea midline.  Respiratory: Normal respiratory effort. no wheeze, no rhonchi, no rales  Cardiovascular: S1/S2 normal, no murmur, no rub/gallop auscultated. RRR.  Musculoskeletal: Gait normal.   Neurological: Normal balance/coordination. No tremor.   Skin: warm, dry    Psychiatric: Normal judgment/insight. Normal mood and affect. Oriented x3.     ASSESSMENT/PLAN:   Dry skin dermatitis - advised daily use of emollient lotion  Hyperlipidemia, unspecified hyperlipidemia type - Plan: Lipid panel  Mohs defect - recovering from facial surgery   Preventive measure - We certainly have the option for Pap/colonoscopy, she is outside normal screening age range for this however given inadequate previous screening would not say no to these tests/referrals. Patient would like to think about it for now, I  think that fine.    Patient Instructions  Lotions to look for:  CeraVe  Aveeno Eczema Apply after cool shower and/or 2-3 times per day if itching is worse      Visit summary with medication list and pertinent instructions was printed for patient to review. All questions at time of visit were answered - patient instructed to contact office with any additional concerns. ER/RTC precautions were reviewed with the patient.   Follow-up plan: Return for medicare visit 01/2017.  Note: Total time spent 25 minutes, greater than 50% of the visit was spent face-to-face counseling and coordinating care for the following: The primary encounter diagnosis was Dry skin dermatitis. Diagnoses of Hyperlipidemia, unspecified hyperlipidemia type, Mohs defect, and Preventive measure were also pertinent to this visit.Marland Kitchen  Please note: voice  recognition software was used to produce this document, and typos may escape review. Please contact Dr. Sheppard Coil for any needed clarifications.

## 2017-10-19 NOTE — Patient Instructions (Addendum)
Lotions to look for:  CeraVe  Aveeno Eczema Apply after cool shower and/or 2-3 times per day if itching is worse

## 2017-11-22 ENCOUNTER — Other Ambulatory Visit: Payer: Self-pay | Admitting: Osteopathic Medicine

## 2017-11-26 LAB — TSH: TSH: 0.22 — AB (ref 0.41–5.90)

## 2017-12-03 ENCOUNTER — Other Ambulatory Visit: Payer: Self-pay | Admitting: Osteopathic Medicine

## 2017-12-03 ENCOUNTER — Other Ambulatory Visit: Payer: Self-pay | Admitting: Hematology & Oncology

## 2017-12-03 DIAGNOSIS — I2510 Atherosclerotic heart disease of native coronary artery without angina pectoris: Secondary | ICD-10-CM

## 2017-12-03 DIAGNOSIS — C73 Malignant neoplasm of thyroid gland: Secondary | ICD-10-CM

## 2017-12-03 DIAGNOSIS — C50112 Malignant neoplasm of central portion of left female breast: Secondary | ICD-10-CM

## 2017-12-03 NOTE — Telephone Encounter (Signed)
[facility] requesting RF on Metoprolol, however pt has Metoprolol listed as an allergy... Added to allergy list in 2008 but with "unknown" reaction, but med IS on med list.  Can you please confirm OK to send in this RF for pt?  Thanks!

## 2017-12-04 NOTE — Telephone Encounter (Signed)
Left a vm msg for pt regarding clarification of med refill. Call back information provided.

## 2017-12-04 NOTE — Telephone Encounter (Signed)
Let's confirm with patient just in case - if she is taking the medicine, ok to refill and remove the allergy from her list. If she's not taking the medicine, we can cancel th Rx. Thanks!

## 2017-12-05 NOTE — Telephone Encounter (Signed)
Left a detailed msg for pt regarding med request from [place]. Call back information provided.

## 2017-12-10 NOTE — Telephone Encounter (Signed)
Attempted to contact pt. No answered, left another detailed vm msg to return call to clinic regarding med clarification. Call back information provided.

## 2017-12-12 NOTE — Telephone Encounter (Signed)
Left msg with pt that RF would be denied and we cannot fill until she calls to confirm if she takes or not and if this needs to be on her allergy list.   RF denied.

## 2017-12-14 ENCOUNTER — Telehealth: Payer: Self-pay

## 2017-12-14 ENCOUNTER — Other Ambulatory Visit: Payer: Self-pay

## 2017-12-14 DIAGNOSIS — I2510 Atherosclerotic heart disease of native coronary artery without angina pectoris: Secondary | ICD-10-CM

## 2017-12-14 MED ORDER — METOPROLOL SUCCINATE ER 25 MG PO TB24: ORAL_TABLET | ORAL | 0 refills | Status: DC

## 2017-12-14 NOTE — Telephone Encounter (Signed)
Pt called today - she was upset that med refill for metoprolol succinate not sent to [place]. Explained to pt, we've made several attempts to reach her for med clarification. Med was listed on pt's allergy list. As per pt, does not have an allergy to med & request med to be removed from allergy list. Pt stated she was currently out of med. RF sent to [place], pt aware of update.

## 2017-12-17 NOTE — Telephone Encounter (Signed)
Noted, thanks!

## 2018-01-10 ENCOUNTER — Inpatient Hospital Stay: Payer: Medicare Other | Attending: Hematology & Oncology

## 2018-01-10 ENCOUNTER — Inpatient Hospital Stay (HOSPITAL_BASED_OUTPATIENT_CLINIC_OR_DEPARTMENT_OTHER): Payer: Medicare Other | Admitting: Hematology & Oncology

## 2018-01-10 ENCOUNTER — Other Ambulatory Visit: Payer: Self-pay

## 2018-01-10 VITALS — BP 119/70 | HR 49 | Temp 97.7°F | Resp 20 | Wt 121.0 lb

## 2018-01-10 DIAGNOSIS — Z7982 Long term (current) use of aspirin: Secondary | ICD-10-CM | POA: Insufficient documentation

## 2018-01-10 DIAGNOSIS — Z79899 Other long term (current) drug therapy: Secondary | ICD-10-CM | POA: Diagnosis not present

## 2018-01-10 DIAGNOSIS — Z170 Estrogen receptor positive status [ER+]: Secondary | ICD-10-CM | POA: Diagnosis not present

## 2018-01-10 DIAGNOSIS — Z79811 Long term (current) use of aromatase inhibitors: Secondary | ICD-10-CM | POA: Insufficient documentation

## 2018-01-10 DIAGNOSIS — Z85820 Personal history of malignant melanoma of skin: Secondary | ICD-10-CM | POA: Diagnosis not present

## 2018-01-10 DIAGNOSIS — C50212 Malignant neoplasm of upper-inner quadrant of left female breast: Secondary | ICD-10-CM | POA: Insufficient documentation

## 2018-01-10 DIAGNOSIS — C73 Malignant neoplasm of thyroid gland: Secondary | ICD-10-CM

## 2018-01-10 LAB — CMP (CANCER CENTER ONLY)
ALK PHOS: 66 U/L (ref 40–150)
ALT: 10 U/L (ref 0–55)
AST: 21 U/L (ref 5–34)
Albumin: 4 g/dL (ref 3.5–5.0)
Anion gap: 6 (ref 3–11)
BUN: 26 mg/dL (ref 7–26)
CALCIUM: 9.7 mg/dL (ref 8.4–10.4)
CHLORIDE: 107 mmol/L (ref 98–109)
CO2: 28 mmol/L (ref 22–29)
CREATININE: 1.01 mg/dL (ref 0.60–1.10)
GFR, EST AFRICAN AMERICAN: 58 mL/min — AB (ref >60)
GFR, Estimated: 50 mL/min — ABNORMAL LOW (ref >60)
GLUCOSE: 87 mg/dL (ref 70–140)
Potassium: 5.1 mmol/L (ref 3.5–5.1)
SODIUM: 141 mmol/L (ref 136–145)
Total Bilirubin: 0.5 mg/dL (ref 0.2–1.2)
Total Protein: 7.1 g/dL (ref 6.4–8.3)

## 2018-01-10 LAB — CBC WITH DIFFERENTIAL (CANCER CENTER ONLY)
BASOS ABS: 0 10*3/uL (ref 0.0–0.1)
Basophils Relative: 0 %
EOS ABS: 0.2 10*3/uL (ref 0.0–0.5)
EOS PCT: 3 %
HCT: 36.4 % (ref 34.8–46.6)
HEMOGLOBIN: 11.9 g/dL (ref 11.6–15.9)
LYMPHS ABS: 1.9 10*3/uL (ref 0.9–3.3)
Lymphocytes Relative: 35 %
MCH: 30.3 pg (ref 26.0–34.0)
MCHC: 32.7 g/dL (ref 32.0–36.0)
MCV: 92.6 fL (ref 81.0–101.0)
MONOS PCT: 10 %
Monocytes Absolute: 0.6 10*3/uL (ref 0.1–0.9)
NEUTROS PCT: 52 %
Neutro Abs: 2.9 10*3/uL (ref 1.5–6.5)
PLATELETS: 181 10*3/uL (ref 145–400)
RBC: 3.93 MIL/uL (ref 3.70–5.32)
RDW: 13.4 % (ref 11.1–15.7)
WBC Count: 5.6 10*3/uL (ref 3.9–10.0)

## 2018-01-10 NOTE — Progress Notes (Signed)
Hematology and Oncology Follow Up Visit  Kristy Olson [id number] [birth year] 84 y.o. 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  She is doing quite well.  She is going down to [place] next week and for a swim event.  She had melanoma surgery that entailed reconstruction of her nose.  She really looks good.  I think she is come through this quite nicely.  She is had no problems with nausea or vomiting.  She is had no bleeding.  She has had no rashes.  There is been no leg swelling.  She is having no problems with the Femara.  She is been on this now for about 2 years.  Currently, her performance status is ECOG 2.   Medications:  Allergies as of 01/10/2018      Reactions   Neomy-bacit-polymyx-pramoxine Itching   Red Dye Itching, Hives   Red frosting caused itching and facial swelling   Amoxicillin Hives, Other (See Comments)   Colesevelam Hcl Other (See Comments)   Doxycycline Hives, Other (See Comments)   Levaquin  [levofloxacin In D5w] Other (See Comments)   Levofloxacin Other (See Comments)   Metoprolol Other (See Comments)   Other reaction(s): unknown   Other Itching, Rash   curad triple antibiotic ointment. Curad Triple Antibiotic Ointment   Statins Hives, Other (See Comments)   Hives Other reaction(s): unknown   Ciprofloxacin    Colesevelam Hives   Neomycin Hives   Allergic reaction only topically. Only topically   Tape Hives, Itching      Medication List        Accurate as of 01/10/18 12:55 PM. Always use your most recent med list.          alendronate 70 MG tablet Commonly known as:  FOSAMAX TAKE 1 TABLET BY MOUTH EVERY 7 DAYS WITH A FULL GLASS OF WATER AND ON AN EMPTY STOMACH   aspirin 81 MG tablet Take 81 mg by mouth daily.     cetirizine 5 MG tablet Commonly known as:  ZYRTEC Take 1 tablet (5 mg total) by mouth daily.   cholecalciferol 1000 units tablet Commonly known as:  VITAMIN D Take 1,000 Units by mouth daily. Reported on 01/05/2016   Fish Oil 1000 MG Caps Take by mouth.   hydrocortisone 2.5 % lotion Apply topically 2 (two) times daily.   letrozole 2.5 MG tablet Commonly known as:  FEMARA TAKE 1 TABLET(2.5 MG) BY MOUTH DAILY   levothyroxine 112 MCG tablet Commonly known as:  SYNTHROID, LEVOTHROID TAKE 1 TABLET BY MOUTH DAILY BEFORE BREAKFAST   Lifitegrast 5 % Soln Commonly known as:  XIIDRA Apply 1 drop to eye 2 (two) times daily.   metoprolol succinate 25 MG 24 hr tablet Commonly known as:  TOPROL-XL TAKE 1 TABLET(25 MG) BY MOUTH DAILY. Pt must keep appt on 01/31/18.   Red Yeast Rice 600 MG Tabs Four by mouth daily to help lower cholesterol       Allergies:  Allergies  Allergen Reactions  . Neomy-Bacit-Polymyx-Pramoxine Itching  . Red Dye Itching and Hives    Red frosting caused itching and facial swelling  . Amoxicillin Hives and Other (See Comments)  . Colesevelam Hcl Other (See Comments)  . Doxycycline Hives and Other (See Comments)  . Levaquin  [Levofloxacin In  D5w] Other (See Comments)  . Levofloxacin Other (See Comments)  . Metoprolol Other (See Comments)    Other reaction(s): unknown  . Other Itching and Rash    curad triple antibiotic ointment. Curad Triple Antibiotic Ointment  . Statins Hives and Other (See Comments)    Hives Other reaction(s): unknown  . Ciprofloxacin   . Colesevelam Hives  . Neomycin Hives    Allergic reaction only topically. Only topically  . Tape Hives and Itching    Past Medical History, Surgical history, Social history, and Family History were reviewed and updated.  Review of Systems: Review of Systems  Constitutional: Negative.   HENT: Positive for congestion and sinus pain.   Eyes: Negative.   Respiratory: Negative.    Cardiovascular: Positive for palpitations.  Gastrointestinal: Negative.   Genitourinary: Negative.   Musculoskeletal: Negative.   Skin: Negative.   Neurological: Negative.   Endo/Heme/Allergies: Negative.   Psychiatric/Behavioral: Negative.     Physical Exam:  weight is 121 lb (54.9 kg). Her oral temperature is 97.7 F (36.5 C). Her blood pressure is 119/70 and her pulse is 49 (abnormal). Her respiration is 20 and oxygen saturation is 98%.   Wt Readings from Last 3 Encounters:  01/10/18 121 lb (54.9 kg)  10/19/17 124 lb 1.9 oz (56.3 kg)  10/11/17 126 lb (57.2 kg)    Physical Exam  Constitutional: She is oriented to person, place, and time.  HENT:  Head: Normocephalic and atraumatic.  Mouth/Throat: Oropharynx is clear and moist.  Eyes: Pupils are equal, round, and reactive to light. EOM are normal.  Neck: Normal range of motion.  Cardiovascular: Normal rate, regular rhythm and normal heart sounds.  Pulmonary/Chest: Effort normal and breath sounds normal.  Abdominal: Soft. Bowel sounds are normal.  Musculoskeletal: Normal range of motion. She exhibits no edema, tenderness or deformity.  Lymphadenopathy:    She has no cervical adenopathy.  Neurological: She is alert and oriented to person, place, and time.  Skin: Skin is warm and dry. No rash noted. No erythema.  Psychiatric: She has a normal mood and affect. Her behavior is normal. Judgment and thought content normal.  Vitals reviewed.    Lab Results  Component Value Date   WBC 5.6 01/10/2018   HGB 11.9 01/10/2018   HCT 36.4 01/10/2018   MCV 92.6 01/10/2018   PLT 181 01/10/2018   Lab Results  Component Value Date   FERRITIN 74 04/30/2017   IRON 95 04/30/2017   TIBC 366 04/30/2017   UIBC 271 04/30/2017   IRONPCTSAT 26 04/30/2017   Lab Results  Component Value Date   RBC 3.93 01/10/2018   No results found for: KPAFRELGTCHN, LAMBDASER, KAPLAMBRATIO No results found for: IGGSERUM, IGA, IGMSERUM No results  found for: Odetta Pink, SPEI   Chemistry      Component Value Date/Time   NA 140 10/11/2017 1125   NA 144 04/30/2017 1134   NA 141 03/13/2017 1143   K 4.4 10/11/2017 1125   K 3.8 04/30/2017 1134   K 4.4 03/13/2017 1143   CL 104 10/11/2017 1125   CL 102 04/30/2017 1134   CO2 28 10/11/2017 1125   CO2 31 04/30/2017 1134   CO2 26 03/13/2017 1143   BUN 23 10/11/2017 1125   BUN 18 04/30/2017 1134   BUN 21.2 03/13/2017 1143   CREATININE 0.92 10/11/2017 1125   CREATININE 1.0 04/30/2017 1134   CREATININE 0.9 03/13/2017 1143      Component Value Date/Time  CALCIUM 9.6 10/11/2017 1125   CALCIUM 9.2 04/30/2017 1134   CALCIUM 9.5 03/13/2017 1143   ALKPHOS 72 10/11/2017 1125   ALKPHOS 68 04/30/2017 1134   ALKPHOS 80 03/13/2017 1143   AST 17 10/11/2017 1125   AST 21 03/13/2017 1143   ALT 10 10/11/2017 1125   ALT 19 04/30/2017 1134   ALT 13 03/13/2017 1143   BILITOT 0.5 10/11/2017 1125   BILITOT 0.50 03/13/2017 1143     Impression and Plan: Kristy Olson is a pleasant 84 yo white female with history of stage I tubulo-lobular carcinoma of the left breast with lumpectomy in August 2017.   Right now, everything looks great with respect to her breast cancer.  I just do not think that breast cancer recurrence is going to be a problem for her.  I think we can get her back in 6 months now.  I think that the risk of her breast cancer coming back is going to be less than 10%.   Volanda Napoleon, MD 5/23/201912:55 PM

## 2018-01-31 ENCOUNTER — Ambulatory Visit: Payer: Self-pay | Admitting: Osteopathic Medicine

## 2018-02-14 ENCOUNTER — Ambulatory Visit: Payer: Self-pay | Admitting: Osteopathic Medicine

## 2018-02-17 ENCOUNTER — Other Ambulatory Visit: Payer: Self-pay

## 2018-02-17 ENCOUNTER — Emergency Department (INDEPENDENT_AMBULATORY_CARE_PROVIDER_SITE_OTHER)
Admission: EM | Admit: 2018-02-17 | Discharge: 2018-02-17 | Disposition: A | Discharge status: Home / Self Care | Payer: Medicare Other | Source: Home / Self Care | Attending: Family Medicine | Admitting: Family Medicine

## 2018-02-17 DIAGNOSIS — H6121 Impacted cerumen, right ear: Secondary | ICD-10-CM | POA: Diagnosis not present

## 2018-02-17 DIAGNOSIS — L299 Pruritus, unspecified: Secondary | ICD-10-CM

## 2018-02-17 NOTE — ED Provider Notes (Signed)
[facility]    CSN: [id number] Arrival date & time: 02/17/18  1534     History   Chief Complaint Chief Complaint  Patient presents with  . Cerumen Impaction    right    HPI Kristy Olson is a 84 y.o. female.   HPI Kristy Olson is a 84 y.o. female presenting to UC with c/o decreased hearing and fullness sensation and itching in her Right ear. No recent swimming. Pt reports having her ears cleaned out by a medical provider about once a year.  She leaves for vacation in about 2 weeks and wanted to have her ear flushed before then. Denies pain, congestion, fever, or chills.   Past Medical History:  Diagnosis Date  . Abnormal finding on MRI of brain   . Breast cancer of upper-inner quadrant of left female breast ([place]) 10/12/2015  . CAD (coronary artery disease)   . Chronic venous insufficiency    Compression stockings, Dr. Carmine Savoy to evaluate late May 2015   . Degeneration of lumbar or lumbosacral intervertebral disc   . History of thyroid cancer    With reported brain mets.   . Hyperlipidemia   . Hypothyroid    Outside records report synthroid 160mcg despite her report of 185mcg. 02/2014 awaiting clarification   . Idiopathic scoliosis   . Osteoporosis    July 2014 DEXA   . PNEUMONIA, COMMUNITY ACQUIRED, PNEUMOCOCCAL   . Venous stasis ulcer ([place])     Patient Active Problem List   Diagnosis Date Noted  . Melanoma ([place]) 08/17/2017  . Mohs defect 08/17/2017  . 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 Decreased thyroid stimulating hormone (TSH) level 07/19/2016  . Filamentary keratitis of right eye 06/26/2016  . Keratoconjunctivitis sicca due to decreased tear production, bilateral 06/26/2016  . Blepharitis of both eyes 06/26/2016  . Atopic conjunctivitis of both eyes 06/26/2016  . Cerumen impaction 04/17/2016  . Hair loss 04/17/2016  . Breast cancer of upper-inner quadrant of left female breast ([place]) 10/12/2015  . Rhinorrhea 12/15/2014  . Chest pain 12/02/2014   . Hypothyroidism, postsurgical 07/21/2014  . CAD (coronary artery disease) 04/29/2014  . Abnormal finding on MRI of brain 04/14/2014  . Malignant neoplasm of thyroid gland ([place]) 04/14/2014  . History of thyroid cancer 03/27/2014  . Chronic venous insufficiency 12/23/2013  . Degeneration of lumbar or lumbosacral intervertebral disc 10/08/2013  . Idiopathic scoliosis 10/08/2013  . Osteoporosis 10/06/2013  . Essential hypertension, benign 08/18/2013  . Venous stasis ulcer of left lower extremity ([place]) 08/06/2013  . Grief 08/06/2013  . Elevated triglycerides with high cholesterol 08/15/2010    Past Surgical History:  Procedure Laterality Date  . BREAST LUMPECTOMY WITH RADIOACTIVE SEED LOCALIZATION Left 03/29/2016   Procedure: LEFT BREAST LUMPECTOMY WITH RADIOACTIVE SEED LOCALIZATION;  Surgeon: Autumn Messing III, MD;  Location: [facility];  Service: General;  Laterality: Left;  LEFT BREAST LUMPECTOMY WITH RADIOACTIVE SEED LOCALIZATION  . cranotomy    . THYROIDECTOMY      OB History   None      Home Medications    Prior to Admission medications   Medication Sig Start Date End Date Taking? Authorizing Provider  alendronate (FOSAMAX) 70 MG tablet TAKE 1 TABLET BY MOUTH EVERY 7 DAYS WITH A FULL GLASS OF WATER AND ON AN EMPTY STOMACH 02/21/16   Renato Shin, MD  aspirin 81 MG tablet Take 81 mg by mouth daily.    [provider]  cetirizine (ZYRTEC) 5 MG tablet Take 1 tablet (5  mg total) by mouth daily. 07/26/17   Noe Gens, PA-C  cholecalciferol (VITAMIN D) 1000 units tablet Take 1,000 Units by mouth daily. Reported on 01/05/2016    [provider]  hydrocortisone 2.5 % lotion Apply topically 2 (two) times daily. 07/26/17   Noe Gens, PA-C  letrozole [facility]) 2.5 MG tablet TAKE 1 TABLET(2.5 MG) BY MOUTH DAILY 12/03/17   Volanda Napoleon, MD  levothyroxine (SYNTHROID, LEVOTHROID) 112 MCG tablet TAKE 1 TABLET BY MOUTH DAILY BEFORE BREAKFAST 11/22/17    Emeterio Reeve, DO  Lifitegrast [facility]) 5 % SOLN Apply 1 drop to eye 2 (two) times daily. 06/26/16   Emeterio Reeve, DO  metoprolol succinate (TOPROL-XL) 25 MG 24 hr tablet TAKE 1 TABLET(25 MG) BY MOUTH DAILY. Pt must keep appt on 01/31/18. 12/14/17   Emeterio Reeve, DO  Omega-3 Fatty Acids (FISH OIL) 1000 MG CAPS Take by mouth.    [provider]  Red Yeast Rice 600 MG TABS Four by mouth daily to help lower cholesterol 01/05/16   Marcial Pacas, DO    Family History Family History  Problem Relation Age of Onset  . CAD Sister   . Cancer Sister   . Heart disease Sister   . Diabetes Father   . COPD Father     Social History Social History   Tobacco Use  . Smoking status: Never Smoker  . Smokeless tobacco: Never Used  Substance Use Topics  . Alcohol use: Yes    Alcohol/week: 0.0 oz    Comment: Occasional  . Drug use: No     Allergies   Neomy-bacit-polymyx-pramoxine; Red dye; Amoxicillin; Colesevelam hcl; Doxycycline; Levaquin  [levofloxacin in d5w]; Levofloxacin; Metoprolol; Other; Statins; Ciprofloxacin; Colesevelam; Neomycin; and Tape   Review of Systems Review of Systems  Constitutional: Negative for chills and fever.  HENT: Positive for ear pain ( fullness and itching, Right) and hearing loss ( Right). Negative for congestion.   Neurological: Negative for dizziness and headaches.     Physical Exam Triage Vital Signs ED Triage Vitals [02/17/18 1605]  Enc Vitals Group     BP (!) 146/60     Pulse Rate 67     Resp      Temp 98.5 F (36.9 C)     Temp Source Oral     SpO2 96 %     Weight 120 lb (54.4 kg)     Height 5\' 7"  (1.702 m)     Head Circumference      Peak Flow      Pain Score 0     Pain Loc      Pain Edu?      Excl. in [place]?    No data found.  Updated Vital Signs BP (!) 146/60 (BP Location: Right Arm)   Pulse 67   Temp 98.5 F (36.9 C) (Oral)   Ht 5\' 7"  (1.702 m)   Wt 120 lb (54.4 kg)   SpO2 96%   BMI 18.79 kg/m   Visual  Acuity Right Eye Distance:   Left Eye Distance:   Bilateral Distance:    Right Eye Near:   Left Eye Near:    Bilateral Near:     Physical Exam  Constitutional: She is oriented to person, place, and time. She appears well-developed and well-nourished. No distress.  HENT:  Head: Normocephalic and atraumatic.  Right Ear: No swelling or tenderness. Tympanic membrane is not erythematous and not bulging.  Right ear: cerumen impaction, dried white skin. Small blood  blister appearing lesion on floor of ear canal. No bleeding or drainage. No swelling.   Eyes: EOM are normal.  Neck: Normal range of motion.  Cardiovascular: Normal rate.  Pulmonary/Chest: Effort normal.  Musculoskeletal: Normal range of motion.  Neurological: She is alert and oriented to person, place, and time.  Skin: Skin is warm and dry. She is not diaphoretic.  Psychiatric: She has a normal mood and affect. Her behavior is normal.  Nursing note and vitals reviewed.    UC Treatments / Results  Labs (all labs ordered are listed, but only abnormal results are displayed) Labs Reviewed - No data to display  EKG None  Radiology No results found.  Procedures Procedures (including critical care time)  Medications Ordered in UC Medications - No data to display  Initial Impression / Assessment and Plan / UC Course  I have reviewed the triage vital signs and the nursing notes.  Pertinent labs & imaging results that were available during my care of the patient were reviewed by me and considered in my medical decision making (see chart for details).     Cerumen removed using flushing technique as well as small amount of white dead skin with ear curette.  Pt is able to ear better. Due to the blood blister, itching and dead skin easily removed from canal, recommend f/u with ENT this week for further evaluation and treatment.   Final Clinical Impressions(s) / UC Diagnoses   Final diagnoses:  Hearing loss of right  ear due to cerumen impaction  Ear itch     Discharge Instructions      Your ear has been cleaned of all the wax. There still appears to be a small blister in your ear with some dead skin that could be affecting your hearing and causing your ear to itch.  It is recommended you follow up with your ENT this week if possible for further evaluation and treatment of symptoms.     ED Prescriptions    None     Controlled Substance Prescriptions [place] Controlled Substance Registry consulted? Not Applicable   Tyrell Antonio 02/17/18 1651

## 2018-02-17 NOTE — ED Triage Notes (Signed)
Started Thursday after getting out of the shower.

## 2018-02-17 NOTE — Discharge Instructions (Signed)
°  Your ear has been cleaned of all the wax. There still appears to be a small blister in your ear with some dead skin that could be affecting your hearing and causing your ear to itch.  It is recommended you follow up with your ENT this week if possible for further evaluation and treatment of symptoms.

## 2018-02-21 ENCOUNTER — Encounter: Payer: Self-pay | Admitting: *Deleted

## 2018-02-21 ENCOUNTER — Emergency Department (INDEPENDENT_AMBULATORY_CARE_PROVIDER_SITE_OTHER)
Admission: EM | Admit: 2018-02-21 | Discharge: 2018-02-21 | Disposition: A | Discharge status: Home / Self Care | Payer: Medicare Other | Source: Home / Self Care

## 2018-02-21 ENCOUNTER — Other Ambulatory Visit: Payer: Self-pay

## 2018-02-21 DIAGNOSIS — L089 Local infection of the skin and subcutaneous tissue, unspecified: Secondary | ICD-10-CM | POA: Diagnosis not present

## 2018-02-21 MED ORDER — AZITHROMYCIN 250 MG PO TABS: ORAL_TABLET | ORAL | 0 refills | Status: DC

## 2018-02-21 MED ORDER — TRIAMCINOLONE ACETONIDE 0.1 % EX CREA: 1.0000 "application " | TOPICAL_CREAM | Freq: Two times a day (BID) | CUTANEOUS | 0 refills | Status: DC

## 2018-02-21 MED ORDER — METHYLPREDNISOLONE SODIUM SUCC 40 MG IJ SOLR
40.0000 mg | Freq: Once | INTRAMUSCULAR | Status: AC
  Administered 2018-02-21: 40 mg via INTRAMUSCULAR

## 2018-02-21 NOTE — ED Triage Notes (Signed)
Ptc/o RT ear swelling, oozing and itching x 3 days after getting perm solution on it.

## 2018-02-21 NOTE — ED Provider Notes (Signed)
Kristy Olson CARE    CSN: [id number] Arrival date & time: 02/21/18  1630     History   Chief Complaint Chief Complaint  Patient presents with  . Ear Problem    HPI Kristy Olson is a 84 y.o. female presents today for evaluation of suspect allergic skin reaction effecting her right ear lobe. She reports 3 days ago, her hair was permed and the chemicals made contact with ear lobe. Since that time, her ear lobe has become increasing itchy, swollen, redden, and yesterday blisters developed. Redness is not extending to the entire ear or face. She denies nausea,vomiting, fatigue, chills, or fever. Reports a similar reaction occurred years ago when her hair was permed. Past Medical History:  Diagnosis Date  . Abnormal finding on MRI of brain   . Breast cancer of upper-inner quadrant of left female breast ([place]) 10/12/2015  . CAD (coronary artery disease)   . Chronic venous insufficiency    Compression stockings, Dr. Carmine Savoy to evaluate late May 2015   . Degeneration of lumbar or lumbosacral intervertebral disc   . History of thyroid cancer    With reported brain mets.   . Hyperlipidemia   . Hypothyroid    Outside records report synthroid 130mcg despite her report of 124mcg. 02/2014 awaiting clarification   . Idiopathic scoliosis   . Osteoporosis    July 2014 DEXA   . PNEUMONIA, COMMUNITY ACQUIRED, PNEUMOCOCCAL   . Venous stasis ulcer ([place])     Patient Active Problem List   Diagnosis Date Noted  . Melanoma ([place]) 08/17/2017  . Mohs defect 08/17/2017  . 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 Decreased thyroid stimulating hormone (TSH) level 07/19/2016  . Filamentary keratitis of right eye 06/26/2016  . Keratoconjunctivitis sicca due to decreased tear production, bilateral 06/26/2016  . Blepharitis of both eyes 06/26/2016  . Atopic conjunctivitis of both eyes 06/26/2016  . Cerumen impaction 04/17/2016  . Hair loss 04/17/2016  . Breast cancer of upper-inner quadrant of left female  breast ([place]) 10/12/2015  . Rhinorrhea 12/15/2014  . Chest pain 12/02/2014  . Hypothyroidism, postsurgical 07/21/2014  . CAD (coronary artery disease) 04/29/2014  . Abnormal finding on MRI of brain 04/14/2014  . Malignant neoplasm of thyroid gland ([place]) 04/14/2014  . History of thyroid cancer 03/27/2014  . Chronic venous insufficiency 12/23/2013  . Degeneration of lumbar or lumbosacral intervertebral disc 10/08/2013  . Idiopathic scoliosis 10/08/2013  . Osteoporosis 10/06/2013  . Essential hypertension, benign 08/18/2013  . Venous stasis ulcer of left lower extremity ([place]) 08/06/2013  . Grief 08/06/2013  . Elevated triglycerides with high cholesterol 08/15/2010    Past Surgical History:  Procedure Laterality Date  . BREAST LUMPECTOMY WITH RADIOACTIVE SEED LOCALIZATION Left 03/29/2016   Procedure: LEFT BREAST LUMPECTOMY WITH RADIOACTIVE SEED LOCALIZATION;  Surgeon: Autumn Messing III, MD;  Location: [facility];  Service: General;  Laterality: Left;  LEFT BREAST LUMPECTOMY WITH RADIOACTIVE SEED LOCALIZATION  . cranotomy    . THYROIDECTOMY      OB History   None      Home Medications    Prior to Admission medications   Medication Sig Start Date End Date Taking? Authorizing Provider  alendronate (FOSAMAX) 70 MG tablet TAKE 1 TABLET BY MOUTH EVERY 7 DAYS WITH A FULL GLASS OF WATER AND ON AN EMPTY STOMACH 02/21/16   Renato Shin, MD  aspirin 81 MG tablet Take 81 mg by mouth daily.    [provider]  cetirizine (ZYRTEC) 5 MG tablet Take 1  tablet (5 mg total) by mouth daily. 07/26/17   Noe Gens, PA-C  cholecalciferol (VITAMIN D) 1000 units tablet Take 1,000 Units by mouth daily. Reported on 01/05/2016    [provider]  hydrocortisone 2.5 % lotion Apply topically 2 (two) times daily. 07/26/17   Noe Gens, PA-C  letrozole [facility]) 2.5 MG tablet TAKE 1 TABLET(2.5 MG) BY MOUTH DAILY 12/03/17   Volanda Napoleon, MD  levothyroxine (SYNTHROID, LEVOTHROID)  112 MCG tablet TAKE 1 TABLET BY MOUTH DAILY BEFORE BREAKFAST 11/22/17   Emeterio Reeve, DO  Lifitegrast [facility]) 5 % SOLN Apply 1 drop to eye 2 (two) times daily. 06/26/16   Emeterio Reeve, DO  metoprolol succinate (TOPROL-XL) 25 MG 24 hr tablet TAKE 1 TABLET(25 MG) BY MOUTH DAILY. Pt must keep appt on 01/31/18. 12/14/17   Emeterio Reeve, DO  Omega-3 Fatty Acids (FISH OIL) 1000 MG CAPS Take by mouth.    [provider]  Red Yeast Rice 600 MG TABS Four by mouth daily to help lower cholesterol 01/05/16   Marcial Pacas, DO    Family History Family History  Problem Relation Age of Onset  . CAD Sister   . Cancer Sister   . Heart disease Sister   . Diabetes Father   . COPD Father     Social History Social History   Tobacco Use  . Smoking status: Never Smoker  . Smokeless tobacco: Never Used  Substance Use Topics  . Alcohol use: Yes    Alcohol/week: 0.0 oz    Comment: Occasional  . Drug use: No     Allergies   Neomy-bacit-polymyx-pramoxine; Red dye; Amoxicillin; Colesevelam hcl; Doxycycline; Levaquin  [levofloxacin in d5w]; Levofloxacin; Metoprolol; Other; Statins; Ciprofloxacin; Colesevelam; Neomycin; and Tape   Review of Systems Review of Systems Pertinent negatives listed in HPI   Physical Exam Triage Vital Signs ED Triage Vitals  Enc Vitals Group     BP 02/21/18 1643 137/71     Pulse Rate 02/21/18 1643 60     Resp 02/21/18 1643 16     Temp 02/21/18 1643 97.7 F (36.5 C)     Temp Source 02/21/18 1643 Oral     SpO2 02/21/18 1643 97 %     Weight 02/21/18 1644 122 lb (55.3 kg)     Height 02/21/18 1644 5\' 7"  (1.702 m)     Head Circumference --      Peak Flow --      Pain Score 02/21/18 1644 0     Pain Loc --      Pain Edu? --      Excl. in [place]? --    No data found.  Updated Vital Signs BP 137/71 (BP Location: Right Arm)   Pulse 60   Temp 97.7 F (36.5 C) (Oral)   Resp 16   Ht 5\' 7"  (1.702 m)   Wt 122 lb (55.3 kg)   SpO2 97%   BMI 19.11 kg/m     Visual Acuity Right Eye Distance:   Left Eye Distance:   Bilateral Distance:    Right Eye Near:   Left Eye Near:    Bilateral Near:     Physical Exam  Constitutional: She is oriented to person, place, and time. She appears well-developed and well-nourished.  HENT:  Ears:  Cardiovascular: Normal rate and regular rhythm.  Pulmonary/Chest: Effort normal and breath sounds normal. She has no wheezes. She has no rhonchi.  Lymphadenopathy:       Head (right side): No  preauricular and no posterior auricular adenopathy present.       Head (left side): No preauricular and no posterior auricular adenopathy present.    She has no cervical adenopathy.  Neurological: She is alert and oriented to person, place, and time.  Skin: Skin is warm and dry. Rash noted. Rash is pustular and vesicular. There is erythema.  Skin thin symmetrically   Vitals reviewed.  UC Treatments / Results  Labs (all labs ordered are listed, but only abnormal results are displayed) Labs Reviewed - No data to display  EKG None  Radiology No results found.  Procedures Procedures (including critical care time)  Medications Ordered in UC Medications - No data to display  Initial Impression / Assessment and Plan / UC Course  I have reviewed the triage vital signs and the nursing notes.  Pertinent labs & imaging results that were available during my care of the patient were reviewed by me and considered in my medical decision making (see chart for details).  Patient presents today with a suspect contact dermatitis secondary to exposure to a hair chemical agent (perm). Reports a prior reaction. Concern for cellulitis as exudative lesion (vesicular and pustular) are present. Will treat empirically for cellulitis with antibiotic therapy and a low potency steroidal cream to treat associated itching. Treatment plan complicated by patient's multiple allergies to commonly prescribed antibiotics. Confirmed with patient  that she was uncertain of a red dye allergy which was listed on her chart. She confirmed that she has no dietary restriction and eats foods which are artificially colored without reaction. Therefore prescribed  Azithromycin for treatment   Unrelated to today's visit, patient was seen for inner ear discomfort and blood filled blisters in right ear. She has a follow-up scheduled with ENT upcoming this week.  Final Clinical Impressions(s) / UC Diagnoses   Final diagnoses:  Skin infection     Discharge Instructions     Return for care if you experience any itching or stomach upset after starting the Zpak medication. Apply triamcinolone cream twice daily as needed no more than 7 days for itching    ED Prescriptions    Medication Sig Dispense Auth. Provider   triamcinolone cream (KENALOG) 0.1 % Apply 1 application topically 2 (two) times daily. 30 g Scot Jun, FNP   azithromycin (ZITHROMAX) 250 MG tablet Take 2 tabs PO x 1 dose, then 1 tab PO QD x 4 days 6 tablet Scot Jun, FNP     Controlled Substance Prescriptions [place] Controlled Substance Registry consulted? Not Applicable   Scot Jun, FNP 02/23/18 1007

## 2018-02-21 NOTE — Discharge Instructions (Addendum)
Return for care if you experience any itching or stomach upset after starting the Zpak medication. Apply triamcinolone cream twice daily as needed no more than 7 days for itching

## 2018-02-22 ENCOUNTER — Other Ambulatory Visit: Payer: Self-pay | Admitting: Endocrinology

## 2018-02-22 ENCOUNTER — Other Ambulatory Visit: Payer: Self-pay | Admitting: Hematology & Oncology

## 2018-02-22 DIAGNOSIS — C73 Malignant neoplasm of thyroid gland: Secondary | ICD-10-CM

## 2018-02-22 DIAGNOSIS — C50112 Malignant neoplasm of central portion of left female breast: Secondary | ICD-10-CM

## 2018-03-18 ENCOUNTER — Encounter: Payer: Self-pay | Admitting: Osteopathic Medicine

## 2018-03-18 ENCOUNTER — Ambulatory Visit (INDEPENDENT_AMBULATORY_CARE_PROVIDER_SITE_OTHER): Payer: Medicare Other | Admitting: Osteopathic Medicine

## 2018-03-18 VITALS — BP 93/55 | HR 60 | Temp 98.0°F | Wt 123.1 lb

## 2018-03-18 DIAGNOSIS — I10 Essential (primary) hypertension: Secondary | ICD-10-CM | POA: Diagnosis not present

## 2018-03-18 DIAGNOSIS — D509 Iron deficiency anemia, unspecified: Secondary | ICD-10-CM | POA: Diagnosis not present

## 2018-03-18 DIAGNOSIS — E039 Hypothyroidism, unspecified: Secondary | ICD-10-CM

## 2018-03-18 DIAGNOSIS — E785 Hyperlipidemia, unspecified: Secondary | ICD-10-CM

## 2018-03-18 NOTE — Progress Notes (Signed)
HPI: Kristy Olson is a 84 y.o. female who  has a past medical history of Abnormal finding on MRI of brain, Breast cancer of upper-inner quadrant of left female breast ([place]) (10/12/2015), CAD (coronary artery disease), Chronic venous insufficiency, Degeneration of lumbar or lumbosacral intervertebral disc, History of thyroid cancer, Hyperlipidemia, Hypothyroid, Idiopathic scoliosis, Osteoporosis, PNEUMONIA, COMMUNITY ACQUIRED, PNEUMOCOCCAL, and Venous stasis ulcer ([place]).  she presents to [facility] today, 03/18/18,  for chief complaint of:  Weight loss Thyroid abnormality  Hypertension   Dermatologist ordered thyroid testing and iron levels: 11/28/17 her TSH was low at 0.129. Has been low here in the past, overdue for repeat. Iron and Ferritin were ok.   We made some adjustments to her thyroid medication but she is actually taking 150 mcg dose, she should be much lower than this.  Weight loss: continuing to be a problem for her. She is up only a few lbs since last visit abut a month ago.  No abdominal pain.  No night sweats or fever/chills.  No unusual headache or dizziness.  Patient states that since she has been in assisted living, she has not been eating as much as she used to      Past medical history, surgical history, and family history reviewed.  Current medication list and allergy/intolerance information reviewed.   (See remainder of HPI, ROS, Phys Exam below)  BP (!) 93/55 (BP Location: Left Arm, Patient Position: Sitting, Cuff Size: Small)   Pulse 60   Temp 98 F (36.7 C) (Oral)   Wt 123 lb 1.6 oz (55.8 kg)   BMI 19.28 kg/m      ASSESSMENT/PLAN:   Hypothyroidism, unspecified type - Plan: CBC, COMPLETE METABOLIC PANEL WITH GFR, TSH  Hyperlipidemia, unspecified hyperlipidemia type  Essential hypertension, benign  Encounter for Medicare annual wellness exam  Microcytic anemia - Plan: Fe+TIBC+Fer   No orders of the defined types  were placed in this encounter.   Patient Instructions  We will call the pharmacy to confirm the dose of Synthroid you are taking! We will need to adjust this dose.   Will get you set up with a CT scan to evaluate abdominal pain    Follow-up plan: Return in about 6 weeks (around 04/29/2018) for recheck weight, get thyroid labs repeated. See me sooner if needed! .     ############################################ ############################################ ############################################ ############################################    Outpatient Encounter Medications as of 03/18/2018  Medication Sig  . alendronate (FOSAMAX) 70 MG tablet TAKE 1 TABLET BY MOUTH EVERY 7 DAYS WITH A FULL GLASS OF WATER AND ON AN EMPTY STOMACH  . aspirin 81 MG tablet Take 81 mg by mouth daily.  Marland Kitchen azithromycin (ZITHROMAX) 250 MG tablet Take 2 tabs PO x 1 dose, then 1 tab PO QD x 4 days  . cetirizine (ZYRTEC) 5 MG tablet Take 1 tablet (5 mg total) by mouth daily.  . cholecalciferol (VITAMIN D) 1000 units tablet Take 1,000 Units by mouth daily. Reported on 01/05/2016  . hydrocortisone 2.5 % lotion Apply topically 2 (two) times daily.  Marland Kitchen letrozole (FEMARA) 2.5 MG tablet TAKE 1 TABLET(2.5 MG) BY MOUTH DAILY  . levothyroxine (SYNTHROID, LEVOTHROID) 112 MCG tablet TAKE 1 TABLET BY MOUTH DAILY BEFORE BREAKFAST  . Lifitegrast (XIIDRA) 5 % SOLN Apply 1 drop to eye 2 (two) times daily.  . metoprolol succinate (TOPROL-XL) 25 MG 24 hr tablet TAKE 1 TABLET(25 MG) BY MOUTH DAILY. Pt must keep appt on 01/31/18.  . Omega-3 Fatty Acids (  FISH OIL) 1000 MG CAPS Take by mouth.  . Red Yeast Rice 600 MG TABS Four by mouth daily to help lower cholesterol  . SYNTHROID 150 MCG tablet TAKE 1 TABLET(150 MCG) BY MOUTH DAILY  . triamcinolone cream (KENALOG) 0.1 % Apply 1 application topically 2 (two) times daily.   No facility-administered encounter medications on file as of 03/18/2018.    Allergies  Allergen Reactions   . Neomy-Bacit-Polymyx-Pramoxine Itching  . Amoxicillin Hives and Other (See Comments)  . Colesevelam Hcl Other (See Comments)  . Doxycycline Hives and Other (See Comments)  . Levaquin  [Levofloxacin In D5w] Other (See Comments)  . Levofloxacin Other (See Comments)  . Metoprolol Other (See Comments)    Other reaction(s): unknown  . Other Itching and Rash    curad triple antibiotic ointment. Curad Triple Antibiotic Ointment  . Statins Hives and Other (See Comments)    Hives Other reaction(s): unknown  . Colesevelam Hives  . Neomycin Hives    Allergic reaction only topically. Only topically  . Red Dye Hives and Itching    Red frosting caused itching and facial swelling  Patient is uncertain this reaction occurred (02/21/18) currently eats foods with red dye  . Tape Hives and Itching      Review of Systems:  Constitutional: No recent illness, + fatigue and weight loss as per HPI  HEENT: No  headache, no vision change  Cardiac: No  chest pain, No  pressure, No palpitations  Respiratory:  No  shortness of breath. No  Cough  Gastrointestinal: No  abdominal pain, no change on bowel habits  Musculoskeletal: No new myalgia/arthralgia  Skin: No  Rash  Neurologic: No  weakness, No  Dizziness  Psychiatric: No  concerns with depression, No  concerns with anxiety  Exam:  BP (!) 93/55 (BP Location: Left Arm, Patient Position: Sitting, Cuff Size: Small)   Pulse 60   Temp 98 F (36.7 C) (Oral)   Wt 123 lb 1.6 oz (55.8 kg)   BMI 19.28 kg/m   Constitutional: VS see above. General Appearance: alert, well-developed, frail, NAD  Eyes: Normal lids and conjunctive, non-icteric sclera  Ears, Nose, Mouth, Throat: MMM, Normal external inspection ears/nares/mouth/lips/gums.  Neck: No masses, trachea midline.   Respiratory: Normal respiratory effort. no wheeze, no rhonchi, no rales  Cardiovascular: S1/S2 normal, no murmur, no rub/gallop auscultated. RRR.   Musculoskeletal: Gait  normal. Symmetric and independent movement of all extremities  Neurological: Normal balance/coordination. No tremor.  Skin: warm, dry, intact.   Psychiatric: Normal judgment/insight. Normal mood and affect. Oriented x3.   Visit summary with medication list and pertinent instructions was printed for patient to review, advised to alert [place] if any changes needed. All questions at time of visit were answered - patient instructed to contact office with any additional concerns. ER/RTC precautions were reviewed with the patient and understanding verbalized.   Follow-up plan: Return in about 6 weeks (around 04/29/2018) for recheck weight, get thyroid labs repeated. See me sooner if needed! .  Note: Total time spent 25 minutes, greater than 50% of the visit was spent face-to-face counseling and coordinating care for the following: The primary encounter diagnosis was Hypothyroidism, unspecified type. Diagnoses of Hyperlipidemia, unspecified hyperlipidemia type, Essential hypertension, benign, and Microcytic anemia were also pertinent to this visit.Marland Kitchen  Please note: voice recognition software was used to produce this document, and typos may escape review. Please contact Dr. Sheppard Coil for any needed clarifications.

## 2018-03-18 NOTE — Patient Instructions (Addendum)
We will call the pharmacy to confirm the dose of Synthroid you are taking! We will need to adjust this dose.   Will get you set up with a CT scan to evaluate abdominal pain

## 2018-03-19 ENCOUNTER — Other Ambulatory Visit: Payer: Self-pay | Admitting: Osteopathic Medicine

## 2018-03-19 LAB — COMPLETE METABOLIC PANEL WITH GFR
AG Ratio: 1.8 (calc) (ref 1.0–2.5)
ALT: 9 U/L (ref 6–29)
AST: 18 U/L (ref 10–35)
Albumin: 3.9 g/dL (ref 3.6–5.1)
Alkaline phosphatase (APISO): 67 U/L (ref 33–130)
BUN/Creatinine Ratio: 27 (calc) — ABNORMAL HIGH (ref 6–22)
BUN: 26 mg/dL — ABNORMAL HIGH (ref 7–25)
CALCIUM: 9.1 mg/dL (ref 8.6–10.4)
CO2: 30 mmol/L (ref 20–32)
CREATININE: 0.96 mg/dL — AB (ref 0.60–0.88)
Chloride: 102 mmol/L (ref 98–110)
GFR, EST NON AFRICAN AMERICAN: 54 mL/min/{1.73_m2} — AB (ref >=60)
GFR, Est African American: 63 mL/min/{1.73_m2} (ref >=60)
GLOBULIN: 2.2 g/dL (ref 1.9–3.7)
Glucose, Bld: 79 mg/dL (ref 65–99)
Potassium: 4.7 mmol/L (ref 3.5–5.3)
SODIUM: 139 mmol/L (ref 135–146)
Total Bilirubin: 0.3 mg/dL (ref 0.2–1.2)
Total Protein: 6.1 g/dL (ref 6.1–8.1)

## 2018-03-19 LAB — CBC
HEMATOCRIT: 32.7 % — AB (ref 35.0–45.0)
HEMOGLOBIN: 10.9 g/dL — AB (ref 11.7–15.5)
MCH: 30.8 pg (ref 27.0–33.0)
MCHC: 33.3 g/dL (ref 32.0–36.0)
MCV: 92.4 fL (ref 80.0–100.0)
MPV: 11.5 fL (ref 7.5–12.5)
Platelets: 175 10*3/uL (ref 140–400)
RBC: 3.54 10*6/uL — ABNORMAL LOW (ref 3.80–5.10)
RDW: 12.6 % (ref 11.0–15.0)
WBC: 6.1 10*3/uL (ref 3.8–10.8)

## 2018-03-19 LAB — TSH: TSH: 0.06 mIU/L — ABNORMAL LOW (ref 0.40–4.50)

## 2018-03-19 MED ORDER — LEVOTHYROXINE SODIUM 112 MCG PO TABS: 112.0000 ug | ORAL_TABLET | Freq: Every day | ORAL | 0 refills | Status: DC

## 2018-03-27 ENCOUNTER — Encounter: Payer: Self-pay | Admitting: Osteopathic Medicine

## 2018-04-16 ENCOUNTER — Telehealth: Payer: Self-pay | Admitting: Osteopathic Medicine

## 2018-04-16 DIAGNOSIS — Q453 Other congenital malformations of pancreas and pancreatic duct: Secondary | ICD-10-CM

## 2018-04-16 DIAGNOSIS — N2889 Other specified disorders of kidney and ureter: Secondary | ICD-10-CM

## 2018-04-16 NOTE — Telephone Encounter (Signed)
Left pt a detailed vm msg regarding provider's note. Call back information provided.

## 2018-04-16 NOTE — Telephone Encounter (Signed)
These call patient: She is due for her one year follow-up CT scan of the abdomen to monitor nodules in the pancreas.  I have placed this order, she should hear about getting this scheduled.

## 2018-04-18 ENCOUNTER — Encounter (HOSPITAL_BASED_OUTPATIENT_CLINIC_OR_DEPARTMENT_OTHER): Payer: Self-pay

## 2018-04-18 ENCOUNTER — Ambulatory Visit (HOSPITAL_BASED_OUTPATIENT_CLINIC_OR_DEPARTMENT_OTHER)
Admission: RE | Admit: 2018-04-18 | Discharge: 2018-04-18 | Disposition: A | Discharge status: Home / Self Care | Payer: Medicare Other | Source: Ambulatory Visit | Attending: Osteopathic Medicine | Admitting: Osteopathic Medicine

## 2018-04-18 DIAGNOSIS — I700 Atherosclerosis of aorta: Secondary | ICD-10-CM | POA: Insufficient documentation

## 2018-04-18 DIAGNOSIS — Q453 Other congenital malformations of pancreas and pancreatic duct: Secondary | ICD-10-CM | POA: Insufficient documentation

## 2018-04-18 DIAGNOSIS — N289 Disorder of kidney and ureter, unspecified: Secondary | ICD-10-CM | POA: Diagnosis not present

## 2018-04-18 DIAGNOSIS — K7689 Other specified diseases of liver: Secondary | ICD-10-CM | POA: Insufficient documentation

## 2018-04-18 DIAGNOSIS — K869 Disease of pancreas, unspecified: Secondary | ICD-10-CM | POA: Insufficient documentation

## 2018-04-18 MED ORDER — IOPAMIDOL (ISOVUE-370) INJECTION 76%
100.0000 mL | Freq: Once | INTRAVENOUS | Status: AC | PRN
  Administered 2018-04-18: 100 mL via INTRAVENOUS

## 2018-04-19 NOTE — Addendum Note (Signed)
Addended by: Maryla Morrow on: 04/19/2018 12:57 PM   Modules accepted: Orders

## 2018-04-24 NOTE — Addendum Note (Signed)
Addended by: Maryla Morrow on: 04/24/2018 08:18 AM   Modules accepted: Orders

## 2018-04-24 NOTE — Telephone Encounter (Signed)
Pt was advised of provider's note on 04/23/18. No other inquiries during call.

## 2018-05-02 ENCOUNTER — Encounter: Payer: Self-pay | Admitting: Osteopathic Medicine

## 2018-05-02 ENCOUNTER — Ambulatory Visit (INDEPENDENT_AMBULATORY_CARE_PROVIDER_SITE_OTHER): Payer: Medicare Other | Admitting: Osteopathic Medicine

## 2018-05-02 VITALS — BP 121/65 | HR 78 | Ht 67.0 in | Wt 119.0 lb

## 2018-05-02 DIAGNOSIS — E039 Hypothyroidism, unspecified: Secondary | ICD-10-CM

## 2018-05-02 DIAGNOSIS — D509 Iron deficiency anemia, unspecified: Secondary | ICD-10-CM | POA: Diagnosis not present

## 2018-05-02 DIAGNOSIS — R634 Abnormal weight loss: Secondary | ICD-10-CM

## 2018-05-02 DIAGNOSIS — N2889 Other specified disorders of kidney and ureter: Secondary | ICD-10-CM

## 2018-05-02 DIAGNOSIS — Q453 Other congenital malformations of pancreas and pancreatic duct: Secondary | ICD-10-CM | POA: Diagnosis not present

## 2018-05-02 DIAGNOSIS — M810 Age-related osteoporosis without current pathological fracture: Secondary | ICD-10-CM

## 2018-05-02 NOTE — Patient Instructions (Signed)
High-Protein and High-Calorie Diet Eating high-protein and high-calorie foods can help you to gain weight, heal after an injury, and recover after an illness or surgery. What is my plan? The specific amount of daily protein and calories you need depends on:  Your body weight.  The reason this diet is recommended for you.  Generally, a high-protein, high-calorie diet involves:  Eating 250-500 extra calories each day.  Making sure that 10-35% of your daily calories come from protein.  Talk to your health care provider about how much protein and how many calories you need each day. Follow the diet as directed by your health care provider. What do I need to know about this diet?  Try to eat six small meals each day instead of three large meals.  Eat a balanced diet, including one food that is high in protein at each meal.  Keep nutritious snacks handy, such as nuts, trail mixes, dried fruit, and yogurt.  If you have kidney disease or diabetes, eating too much protein may put extra stress on your kidneys. Talk to your health care provider if you have either of those conditions. What are some high-protein foods? Grains Quinoa. Bulgur wheat. Vegetables Soybeans. Peas. Meats and Other Protein Sources Beef, pork, and poultry. Fish and seafood. Eggs. Tofu. Textured vegetable protein (TVP). Peanut butter. Nuts and seeds. Dried beans. Protein powders. Dairy Whole milk. Whole-milk yogurt. Powdered milk. Cheese. Yahoo. Eggnog. Beverages High-protein supplement drinks. Soy milk. Other Protein bars. The items listed above may not be a complete list of recommended foods or beverages. Contact your dietitian for more options. What are some high-calorie foods? Grains Pasta. Quick breads. Muffins. Pancakes. Ready-to-eat cereal. Vegetables Vegetables cooked in oil or butter. Fried potatoes. Fruits Dried fruit. Fruit leather. Canned fruit in syrup. Fruit juice. Avocados. Meats and  Other Protein Sources Peanut butter. Nuts and seeds. Dairy Heavy cream. Whipped cream. Cream cheese. Sour cream. Ice cream. Custard. Pudding. Beverages Meal-replacement beverages. Nutrition shakes. Fruit juice. Sugar-sweetened soft drinks. Condiments Salad dressing. Mayonnaise. Alfredo sauce. Fruit preserves or jelly. Honey. Syrup. Sweets/Desserts Cake. Cookies. Pie. Pastries. Candy bars. Chocolate. Fats and Oils Butter or margarine. Oil. Gravy. Other Meal-replacement bars. The items listed above may not be a complete list of recommended foods or beverages. Contact your dietitian for more options. What are some tips for including high-protein and high-calorie foods in my diet?  Add whole milk, half-and-half, or heavy cream to cereal, pudding, soup, or hot cocoa.  Add whole milk to instant breakfast drinks.  Add peanut butter to oatmeal or smoothies.  Add powdered milk to baked goods, smoothies, or milkshakes.  Add powdered milk, cream, or butter to mashed potatoes.  Add cheese to cooked vegetables.  Make whole-milk yogurt parfaits. Top them with granola, fruit, or nuts.  Add cottage cheese to your fruit.  Add avocados, cheese, or both to sandwiches or salads.  Add meat, poultry, or seafood to rice, pasta, casseroles, salads, and soups.  Use mayonnaise when making egg salad, chicken salad, or tuna salad.  Use peanut butter as a topping for pretzels, celery, or crackers.  Add beans to casseroles, dips, and spreads.  Add pureed beans to sauces and soups.  Replace calorie-free drinks with calorie-containing drinks, such as milk and fruit juice. This information is not intended to replace advice given to you by your health care provider. Make sure you discuss any questions you have with your health care provider. Document Released: 08/07/2005 Document Revised: 01/13/2016 Document Reviewed: 01/20/2014 Elsevier Interactive  Patient Education  [employer].

## 2018-05-02 NOTE — Progress Notes (Signed)
HPI: Kristy Olson is a 84 y.o. female who  has a past medical history of Abnormal finding on MRI of brain, Breast cancer of upper-inner quadrant of left female breast ([place]) (10/12/2015), CAD (coronary artery disease), Chronic venous insufficiency, Degeneration of lumbar or lumbosacral intervertebral disc, History of thyroid cancer, Hyperlipidemia, Hypothyroid, Idiopathic scoliosis, Osteoporosis, PNEUMONIA, COMMUNITY ACQUIRED, PNEUMOCOCCAL, and Venous stasis ulcer ([place]).  she presents to [facility] today, 05/02/18,  for chief complaint of:  Weight follow-up, thyroid follow-up  Wt Readings from Last 3 Encounters:  05/02/18 119 lb (54 kg)  03/18/18 123 lb 1.6 oz (55.8 kg)  02/21/18 122 lb (55.3 kg)   Getting thyroid meidcine to a good dose has been our challenge. She was still taking 150 mcg when she should have been on 112.  She states she is pretty sure she is taking this now, the pills are pink, we looked it up and the 112 mcg tablets are pink.  Patient has appointment tomorrow with urology regarding renal mass found on CT scan done for follow-up of pancreatic lesions.  He has some questions about medications including alendronate, not sure if she is supposed to be on this, has a lot of difficulty remembering a once per week medication.  She is not sure how long she has been taking this.   Past medical history, surgical history, and family history reviewed.  Current medication list and allergy/intolerance information reviewed.   (See remainder of HPI, ROS, Phys Exam below)    ASSESSMENT/PLAN:   Pancreatic abnormality - Lesion seems stable, no further work-up warranted right now - Plan: TSH, T4, free, CBC, Fe+TIBC+Fer, Lipase, Amylase  Hypothyroidism, unspecified type - Will recheck TSH, hopefully we are at a better place with it.  Patient also request endocrinology referral - Plan: TSH, T4, free, CBC, Fe+TIBC+Fer, Lipase, Amylase, Ambulatory  referral to Endocrinology  Microcytic anemia - Will recheck CBC, iron - Plan: TSH, T4, free, CBC, Fe+TIBC+Fer, Lipase, Amylase  Osteoporosis without current pathological fracture, unspecified osteoporosis type - We will have endocrinology help [place] out with this if needed, patient is going to confirm with her pharmacy how long she has been taking the alendronate - Plan: Ambulatory referral to Endocrinology  Weight loss - I have some concerns for possible cancer, but picture is complicated by uncontrolled thyroid levels, patient has stopped eating breakfast since she is sleeping   Renal mass, right    Patient Instructions  High-Protein and High-Calorie Diet Eating high-protein and high-calorie foods can help you to gain weight, heal after an injury, and recover after an illness or surgery. What is my plan? The specific amount of daily protein and calories you need depends on:  Your body weight.  The reason this diet is recommended for you.  Generally, a high-protein, high-calorie diet involves:  Eating 250-500 extra calories each day.  Making sure that 10-35% of your daily calories come from protein.  Talk to your health care provider about how much protein and how many calories you need each day. Follow the diet as directed by your health care provider. What do I need to know about this diet?  Try to eat six small meals each day instead of three large meals.  Eat a balanced diet, including one food that is high in protein at each meal.  Keep nutritious snacks handy, such as nuts, trail mixes, dried fruit, and yogurt.  If you have kidney disease or diabetes, eating too much protein may put extra stress  on your kidneys. Talk to your health care provider if you have either of those conditions. What are some high-protein foods? Grains Quinoa. Bulgur wheat. Vegetables Soybeans. Peas. Meats and Other Protein Sources Beef, pork, and poultry. Fish and seafood. Eggs. Tofu. Textured  vegetable protein (TVP). Peanut butter. Nuts and seeds. Dried beans. Protein powders. Dairy Whole milk. Whole-milk yogurt. Powdered milk. Cheese. Yahoo. Eggnog. Beverages High-protein supplement drinks. Soy milk. Other Protein bars. The items listed above may not be a complete list of recommended foods or beverages. Contact your dietitian for more options. What are some high-calorie foods? Grains Pasta. Quick breads. Muffins. Pancakes. Ready-to-eat cereal. Vegetables Vegetables cooked in oil or butter. Fried potatoes. Fruits Dried fruit. Fruit leather. Canned fruit in syrup. Fruit juice. Avocados. Meats and Other Protein Sources Peanut butter. Nuts and seeds. Dairy Heavy cream. Whipped cream. Cream cheese. Sour cream. Ice cream. Custard. Pudding. Beverages Meal-replacement beverages. Nutrition shakes. Fruit juice. Sugar-sweetened soft drinks. Condiments Salad dressing. Mayonnaise. Alfredo sauce. Fruit preserves or jelly. Honey. Syrup. Sweets/Desserts Cake. Cookies. Pie. Pastries. Candy bars. Chocolate. Fats and Oils Butter or margarine. Oil. Gravy. Other Meal-replacement bars. The items listed above may not be a complete list of recommended foods or beverages. Contact your dietitian for more options. What are some tips for including high-protein and high-calorie foods in my diet?  Add whole milk, half-and-half, or heavy cream to cereal, pudding, soup, or hot cocoa.  Add whole milk to instant breakfast drinks.  Add peanut butter to oatmeal or smoothies.  Add powdered milk to baked goods, smoothies, or milkshakes.  Add powdered milk, cream, or butter to mashed potatoes.  Add cheese to cooked vegetables.  Make whole-milk yogurt parfaits. Top them with granola, fruit, or nuts.  Add cottage cheese to your fruit.  Add avocados, cheese, or both to sandwiches or salads.  Add meat, poultry, or seafood to rice, pasta, casseroles, salads, and soups.  Use mayonnaise  when making egg salad, chicken salad, or tuna salad.  Use peanut butter as a topping for pretzels, celery, or crackers.  Add beans to casseroles, dips, and spreads.  Add pureed beans to sauces and soups.  Replace calorie-free drinks with calorie-containing drinks, such as milk and fruit juice. This information is not intended to replace advice given to you by your health care provider. Make sure you discuss any questions you have with your health care provider. Document Released: 08/07/2005 Document Revised: 01/13/2016 Document Reviewed: 01/20/2014 Elsevier Interactive Patient Education  2018 [employer].    Follow-up plan: Return for recheck depending on conversation with urologist .                                       ############################################ ############################################ ############################################ ############################################    Outpatient Encounter Medications as of 05/02/2018  Medication Sig  . alendronate (FOSAMAX) 70 MG tablet TAKE 1 TABLET BY MOUTH EVERY 7 DAYS WITH A FULL GLASS OF WATER AND ON AN EMPTY STOMACH  . aspirin 81 MG tablet Take 81 mg by mouth daily.  . cetirizine (ZYRTEC) 5 MG tablet Take 1 tablet (5 mg total) by mouth daily.  . cholecalciferol (VITAMIN D) 1000 units tablet Take 1,000 Units by mouth daily. Reported on 01/05/2016  . hydrocortisone 2.5 % lotion Apply topically 2 (two) times daily.  Marland Kitchen letrozole (FEMARA) 2.5 MG tablet TAKE 1 TABLET(2.5 MG) BY MOUTH DAILY  . levothyroxine (SYNTHROID, LEVOTHROID) 112 MCG  tablet Take 1 tablet (112 mcg total) by mouth daily before breakfast. Due for recheck labs when this pill bottle is running low  . Lifitegrast (XIIDRA) 5 % SOLN Apply 1 drop to eye 2 (two) times daily.  . metoprolol succinate (TOPROL-XL) 25 MG 24 hr tablet TAKE 1 TABLET(25 MG) BY MOUTH DAILY. Pt must keep appt on 01/31/18.  . Omega-3 Fatty Acids  (FISH OIL) 1000 MG CAPS Take by mouth.  . Red Yeast Rice 600 MG TABS Four by mouth daily to help lower cholesterol  . triamcinolone cream (KENALOG) 0.1 % Apply 1 application topically 2 (two) times daily.   No facility-administered encounter medications on file as of 05/02/2018.    Allergies  Allergen Reactions  . Neomy-Bacit-Polymyx-Pramoxine Itching  . Amoxicillin Hives and Other (See Comments)  . Colesevelam Hcl Other (See Comments)  . Doxycycline Hives and Other (See Comments)  . Levaquin  [Levofloxacin In D5w] Other (See Comments)  . Levofloxacin Other (See Comments)  . Metoprolol Other (See Comments)    Other reaction(s): unknown  . Other Itching and Rash    curad triple antibiotic ointment. Curad Triple Antibiotic Ointment  . Statins Hives and Other (See Comments)    Hives Other reaction(s): unknown  . Colesevelam Hives  . Neomycin Hives    Allergic reaction only topically. Only topically  . Red Dye Hives and Itching    Red frosting caused itching and facial swelling  Patient is uncertain this reaction occurred (02/21/18) currently eats foods with red dye  . Tape Hives and Itching      Review of Systems:  Constitutional: No recent illness  HEENT: No  headache, no vision change  Cardiac: No  chest pain, No  pressure, No palpitations  Respiratory:  No  shortness of breath. No  Cough  Musculoskeletal: No new myalgia/arthralgia  Skin: No  Rash  Neurologic: No  weakness, No  Dizziness  Psychiatric: No  concerns with depression, +concerns with anxiety  Exam:  BP 121/65   Pulse 78   Ht 5\' 7"  (1.702 m)   Wt 119 lb (54 kg)   BMI 18.64 kg/m   Constitutional: VS see above. General Appearance: alert, well-developed, well-nourished, NAD  Eyes: Normal lids and conjunctive, non-icteric sclera  Ears, Nose, Mouth, Throat: MMM, Normal external inspection ears/nares/mouth/lips/gums.  Neck: No masses, trachea midline.   Respiratory: Normal respiratory effort. no  wheeze, no rhonchi, no rales  Cardiovascular: S1/S2 normal, no murmur, no rub/gallop auscultated. RRR.   Musculoskeletal: Gait normal. Symmetric and independent movement of all extremities  Neurological: Normal balance/coordination. No tremor.  Skin: warm, dry, intact.   Psychiatric: Normal judgment/insight. Normal mood and affect. Oriented x3.   Visit summary with medication list and pertinent instructions was printed for patient to review, advised to alert [place] if any changes needed. All questions at time of visit were answered - patient instructed to contact office with any additional concerns. ER/RTC precautions were reviewed with the patient and understanding verbalized.   Follow-up plan: Return for recheck depending on conversation with urologist .    Please note: voice recognition software was used to produce this document, and typos may escape review. Please contact Dr. Sheppard Coil for any needed clarifications.

## 2018-05-03 ENCOUNTER — Other Ambulatory Visit: Payer: Self-pay | Admitting: Osteopathic Medicine

## 2018-05-03 DIAGNOSIS — E039 Hypothyroidism, unspecified: Secondary | ICD-10-CM

## 2018-05-03 LAB — CBC
HCT: 35.9 % (ref 35.0–45.0)
Hemoglobin: 11.9 g/dL (ref 11.7–15.5)
MCH: 30.7 pg (ref 27.0–33.0)
MCHC: 33.1 g/dL (ref 32.0–36.0)
MCV: 92.5 fL (ref 80.0–100.0)
MPV: 11.5 fL (ref 7.5–12.5)
PLATELETS: 188 10*3/uL (ref 140–400)
RBC: 3.88 10*6/uL (ref 3.80–5.10)
RDW: 12.2 % (ref 11.0–15.0)
WBC: 5.1 10*3/uL (ref 3.8–10.8)

## 2018-05-03 LAB — T4, FREE: Free T4: 1.8 ng/dL (ref 0.8–1.8)

## 2018-05-03 LAB — TSH: TSH: 0.14 mIU/L — ABNORMAL LOW (ref 0.40–4.50)

## 2018-05-03 LAB — LIPASE: LIPASE: 39 U/L (ref 7–60)

## 2018-05-03 LAB — IRON,TIBC AND FERRITIN PANEL
%SAT: 22 % (calc) (ref 16–45)
FERRITIN: 57 ng/mL (ref 16–288)
IRON: 81 ug/dL (ref 45–160)
TIBC: 367 mcg/dL (calc) (ref 250–450)

## 2018-05-03 LAB — AMYLASE: Amylase: 55 U/L (ref 21–101)

## 2018-05-03 MED ORDER — LEVOTHYROXINE SODIUM 88 MCG PO TABS: 88.0000 ug | ORAL_TABLET | Freq: Every day | ORAL | 0 refills | Status: DC

## 2018-05-03 NOTE — Addendum Note (Signed)
Addended by: Maryla Morrow on: 05/03/2018 12:28 PM   Modules accepted: Orders

## 2018-06-07 ENCOUNTER — Telehealth: Payer: Self-pay

## 2018-06-07 ENCOUNTER — Other Ambulatory Visit: Payer: Self-pay | Admitting: Hematology & Oncology

## 2018-06-07 DIAGNOSIS — C50112 Malignant neoplasm of central portion of left female breast: Secondary | ICD-10-CM

## 2018-06-07 DIAGNOSIS — L989 Disorder of the skin and subcutaneous tissue, unspecified: Secondary | ICD-10-CM

## 2018-06-07 DIAGNOSIS — C73 Malignant neoplasm of thyroid gland: Secondary | ICD-10-CM

## 2018-06-07 NOTE — Telephone Encounter (Signed)
Pt called stating she is having a hard time locating a electrolysis office on her own. Requesting a recommendation from provider. Pls advise, thanks.

## 2018-06-08 ENCOUNTER — Other Ambulatory Visit: Payer: Self-pay | Admitting: Osteopathic Medicine

## 2018-06-08 DIAGNOSIS — I2510 Atherosclerotic heart disease of native coronary artery without angina pectoris: Secondary | ICD-10-CM

## 2018-06-10 NOTE — Telephone Encounter (Signed)
I am not familiar with any dermatology clinic off the top of my head that does electrolysis.  Jenny Reichmann, do you know of a place? Otherwise patient should call her insurance company and see which providers are covered, no electrolysis is typically a cosmetic procedure and she may not be covered for this anyway.  I went ahead and put in a new dermatology referral order

## 2018-06-11 NOTE — Telephone Encounter (Signed)
I have no idea of a dermatologist that does electolysis I called [facility] and they do not please have [place] call patient to call her insurance company - [employer]

## 2018-06-11 NOTE — Telephone Encounter (Signed)
Attempted to contact Pt, no answer and no VM set up.  

## 2018-06-11 NOTE — Telephone Encounter (Signed)
Would agree -can let patient know that electrolysis is probably not a covered medically necessary procedure so she would be on her own to find a facility that does this and discussed with her insurance company if this is something that they will cover

## 2018-06-12 NOTE — Telephone Encounter (Signed)
Attempted to contact pt, no answer. Unable to leave vm msg - vm not set up.

## 2018-06-15 ENCOUNTER — Other Ambulatory Visit: Payer: Self-pay | Admitting: Osteopathic Medicine

## 2018-06-15 ENCOUNTER — Other Ambulatory Visit: Payer: Self-pay | Admitting: Endocrinology

## 2018-06-15 DIAGNOSIS — E039 Hypothyroidism, unspecified: Secondary | ICD-10-CM

## 2018-06-15 NOTE — Telephone Encounter (Signed)
Please refill x 1 Ov is due  

## 2018-07-15 ENCOUNTER — Inpatient Hospital Stay: Payer: Medicare Other

## 2018-07-15 ENCOUNTER — Encounter: Payer: Self-pay | Admitting: Hematology & Oncology

## 2018-07-15 ENCOUNTER — Other Ambulatory Visit: Payer: Self-pay

## 2018-07-15 ENCOUNTER — Inpatient Hospital Stay: Payer: Medicare Other | Attending: Hematology & Oncology | Admitting: Hematology & Oncology

## 2018-07-15 VITALS — BP 117/55 | HR 70 | Temp 98.1°F | Resp 16 | Wt 120.0 lb

## 2018-07-15 DIAGNOSIS — Z7982 Long term (current) use of aspirin: Secondary | ICD-10-CM

## 2018-07-15 DIAGNOSIS — C73 Malignant neoplasm of thyroid gland: Secondary | ICD-10-CM

## 2018-07-15 DIAGNOSIS — Z79811 Long term (current) use of aromatase inhibitors: Secondary | ICD-10-CM | POA: Insufficient documentation

## 2018-07-15 DIAGNOSIS — Z79899 Other long term (current) drug therapy: Secondary | ICD-10-CM | POA: Diagnosis not present

## 2018-07-15 DIAGNOSIS — C50212 Malignant neoplasm of upper-inner quadrant of left female breast: Secondary | ICD-10-CM

## 2018-07-15 DIAGNOSIS — C50912 Malignant neoplasm of unspecified site of left female breast: Secondary | ICD-10-CM | POA: Diagnosis present

## 2018-07-15 DIAGNOSIS — Z170 Estrogen receptor positive status [ER+]: Secondary | ICD-10-CM | POA: Diagnosis not present

## 2018-07-15 LAB — CMP (CANCER CENTER ONLY)
ALT: 9 U/L (ref 0–44)
AST: 21 U/L (ref 15–41)
Albumin: 3.6 g/dL (ref 3.5–5.0)
Alkaline Phosphatase: 68 U/L (ref 38–126)
Anion gap: 10 (ref 5–15)
BILIRUBIN TOTAL: 0.4 mg/dL (ref 0.3–1.2)
BUN: 23 mg/dL (ref 8–23)
CHLORIDE: 104 mmol/L (ref 98–111)
CO2: 27 mmol/L (ref 22–32)
CREATININE: 1.05 mg/dL — AB (ref 0.44–1.00)
Calcium: 9.1 mg/dL (ref 8.9–10.3)
GFR, EST NON AFRICAN AMERICAN: 47 mL/min — AB (ref >60)
GFR, Est AFR Am: 55 mL/min — ABNORMAL LOW (ref >60)
Glucose, Bld: 73 mg/dL (ref 70–99)
Potassium: 4.5 mmol/L (ref 3.5–5.1)
Sodium: 141 mmol/L (ref 135–145)
Total Protein: 6.7 g/dL (ref 6.5–8.1)

## 2018-07-15 LAB — CBC WITH DIFFERENTIAL (CANCER CENTER ONLY)
ABS IMMATURE GRANULOCYTES: 0.02 10*3/uL (ref 0.00–0.07)
Basophils Absolute: 0 10*3/uL (ref 0.0–0.1)
Basophils Relative: 1 %
Eosinophils Absolute: 0.1 10*3/uL (ref 0.0–0.5)
Eosinophils Relative: 3 %
HEMATOCRIT: 34.8 % — AB (ref 36.0–46.0)
HEMOGLOBIN: 11 g/dL — AB (ref 12.0–15.0)
Immature Granulocytes: 0 %
LYMPHS PCT: 25 %
Lymphs Abs: 1.2 10*3/uL (ref 0.7–4.0)
MCH: 30.1 pg (ref 26.0–34.0)
MCHC: 31.6 g/dL (ref 30.0–36.0)
MCV: 95.1 fL (ref 80.0–100.0)
MONO ABS: 0.3 10*3/uL (ref 0.1–1.0)
MONOS PCT: 7 %
Neutro Abs: 3.1 10*3/uL (ref 1.7–7.7)
Neutrophils Relative %: 64 %
Platelet Count: 164 10*3/uL (ref 150–400)
RBC: 3.66 MIL/uL — AB (ref 3.87–5.11)
RDW: 13.5 % (ref 11.5–15.5)
WBC: 4.8 10*3/uL (ref 4.0–10.5)
nRBC: 0 % (ref 0.0–0.2)

## 2018-07-15 NOTE — Progress Notes (Signed)
Hematology and Oncology Follow Up Visit  Kristy Olson [id number] April 18, 1932 84 y.o. 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  As always, she is doing pretty well.  We saw her 6 months ago.  Since then, she is had really no issues.  She saw her endocrinologist.  He went ahead and ordered a total body thyroid scan.  This was done on 07/12/2018.  Thankfully, the thyroid scan did not show any evidence of uptake.  As far as her breast cancer is concerned, she is doing well with the Femara.  She has not had a follow-up mammogram.  I will order one.  I think if her mammogram comes back negative, given her age, I do not think she needs another one.  She has had no issues with fever.  She is had no change in bowel or bladder habits.  She has had no leg swelling.  She is had no bleeding.  Overall, her performance status is ECOG 2.   Medications:  Allergies as of 07/15/2018      Reactions   Neomy-bacit-polymyx-pramoxine Itching   Amoxicillin Hives, Other (See Comments)   Colesevelam Hcl Other (See Comments)   Doxycycline Hives, Other (See Comments)   Levaquin  [levofloxacin In D5w] Other (See Comments)   Levofloxacin Other (See Comments)   Metoprolol Other (See Comments)   Other reaction(s): unknown   Other Itching, Rash   curad triple antibiotic ointment. Curad Triple Antibiotic Ointment   Statins Hives, Other (See Comments)   Hives Other reaction(s): unknown   Colesevelam Hives   Neomycin Hives   Allergic reaction only topically. Only topically   Red Dye Hives, Itching   Red frosting caused itching and facial swelling Patient is uncertain this reaction occurred (02/21/18) currently eats foods with red dye   Tape Hives, Itching      Medication List        Accurate as of 07/15/18  1:44 PM. Always use your most recent med list.          alendronate 70 MG tablet Commonly known as:  FOSAMAX TAKE 1 TABLET BY MOUTH EVERY 7 DAYS WITH A FULL GLASS OF WATER AND ON AN EMPTY STOMACH   aspirin 81 MG tablet Take 81 mg by mouth daily.   cetirizine 5 MG tablet Commonly known as:  ZYRTEC Take 1 tablet (5 mg total) by mouth daily.   cholecalciferol 1000 units tablet Commonly known as:  VITAMIN D Take 1,000 Units by mouth daily. Reported on 01/05/2016   Fish Oil 1000 MG Caps Take by mouth.   hydrocortisone 2.5 % lotion Apply topically 2 (two) times daily.   letrozole 2.5 MG tablet Commonly known as:  FEMARA TAKE 1 TABLET(2.5 MG) BY MOUTH DAILY   Lifitegrast 5 % Soln Apply 1 drop to eye 2 (two) times daily.   metoprolol succinate 25 MG 24 hr tablet Commonly known as:  TOPROL-XL TAKE 1 TABLET(25 MG) BY MOUTH DAILY   SYNTHROID 150 MCG tablet Generic drug:  levothyroxine TAKE 1 TABLET(150 MCG) BY MOUTH DAILY   triamcinolone cream 0.1 % Commonly known as:  KENALOG Apply 1 application topically 2 (two) times daily.       Allergies:  Allergies  Allergen Reactions  . Neomy-Bacit-Polymyx-Pramoxine Itching  . Amoxicillin Hives and  Other (See Comments)  . Colesevelam Hcl Other (See Comments)  . Doxycycline Hives and Other (See Comments)  . Levaquin  [Levofloxacin In D5w] Other (See Comments)  . Levofloxacin Other (See Comments)  . Metoprolol Other (See Comments)    Other reaction(s): unknown  . Other Itching and Rash    curad triple antibiotic ointment. Curad Triple Antibiotic Ointment  . Statins Hives and Other (See Comments)    Hives Other reaction(s): unknown  . Colesevelam Hives  . Neomycin Hives    Allergic reaction only topically. Only topically  . Red Dye Hives and Itching    Red frosting caused itching and facial swelling  Patient is uncertain this reaction occurred (02/21/18) currently eats foods with red dye  . Tape  Hives and Itching    Past Medical History, Surgical history, Social history, and Family History were reviewed and updated.  Review of Systems: Review of Systems  Constitutional: Negative.   HENT: Positive for congestion and sinus pain.   Eyes: Negative.   Respiratory: Negative.   Cardiovascular: Positive for palpitations.  Gastrointestinal: Negative.   Genitourinary: Negative.   Musculoskeletal: Negative.   Skin: Negative.   Neurological: Negative.   Endo/Heme/Allergies: Negative.   Psychiatric/Behavioral: Negative.     Physical Exam:  weight is 120 lb (54.4 kg). Her oral temperature is 98.1 F (36.7 C). Her blood pressure is 117/55 (abnormal) and her pulse is 70. Her respiration is 16 and oxygen saturation is 100%.   Wt Readings from Last 3 Encounters:  07/15/18 120 lb (54.4 kg)  05/02/18 119 lb (54 kg)  03/18/18 123 lb 1.6 oz (55.8 kg)    Physical Exam  Constitutional: She is oriented to person, place, and time.  HENT:  Head: Normocephalic and atraumatic.  Mouth/Throat: Oropharynx is clear and moist.  Eyes: Pupils are equal, round, and reactive to light. EOM are normal.  Neck: Normal range of motion.  Cardiovascular: Normal rate, regular rhythm and normal heart sounds.  Pulmonary/Chest: Effort normal and breath sounds normal.  Abdominal: Soft. Bowel sounds are normal.  Musculoskeletal: Normal range of motion. She exhibits no edema, tenderness or deformity.  Lymphadenopathy:    She has no cervical adenopathy.  Neurological: She is alert and oriented to person, place, and time.  Skin: Skin is warm and dry. No rash noted. No erythema.  Psychiatric: She has a normal mood and affect. Her behavior is normal. Judgment and thought content normal.  Vitals reviewed.    Lab Results  Component Value Date   WBC 4.8 07/15/2018   HGB 11.0 (L) 07/15/2018   HCT 34.8 (L) 07/15/2018   MCV 95.1 07/15/2018   PLT 164 07/15/2018   Lab Results  Component Value Date   FERRITIN  57 05/02/2018   IRON 81 05/02/2018   TIBC 367 05/02/2018   UIBC 271 04/30/2017   IRONPCTSAT 22 05/02/2018   Lab Results  Component Value Date   RBC 3.66 (L) 07/15/2018   No results found for: KPAFRELGTCHN, LAMBDASER, KAPLAMBRATIO No results found for: IGGSERUM, IGA, IGMSERUM No results found for: Ronnald Ramp, A1GS, A2GS, Tillman Sers, SPEI   Chemistry      Component Value Date/Time   NA 139 03/18/2018 1527   NA 144 04/30/2017 1134   NA 141 03/13/2017 1143   K 4.7 03/18/2018 1527   K 3.8 04/30/2017 1134   K 4.4 03/13/2017 1143   CL 102 03/18/2018 1527   CL 102 04/30/2017 1134   CO2 30 03/18/2018 1527  CO2 31 04/30/2017 1134   CO2 26 03/13/2017 1143   BUN 26 (H) 03/18/2018 1527   BUN 18 04/30/2017 1134   BUN 21.2 03/13/2017 1143   CREATININE 0.96 (H) 03/18/2018 1527   CREATININE 0.9 03/13/2017 1143      Component Value Date/Time   CALCIUM 9.1 03/18/2018 1527   CALCIUM 9.2 04/30/2017 1134   CALCIUM 9.5 03/13/2017 1143   ALKPHOS 66 01/10/2018 1137   ALKPHOS 68 04/30/2017 1134   ALKPHOS 80 03/13/2017 1143   AST 18 03/18/2018 1527   AST 21 01/10/2018 1137   AST 21 03/13/2017 1143   ALT 9 03/18/2018 1527   ALT 10 01/10/2018 1137   ALT 19 04/30/2017 1134   ALT 13 03/13/2017 1143   BILITOT 0.3 03/18/2018 1527   BILITOT 0.5 01/10/2018 1137   BILITOT 0.50 03/13/2017 1143     Impression and Plan: Ms. Pitter is a pleasant 84 yo white female with history of stage I tubulo-lobular carcinoma of the left breast with lumpectomy in August 2017.   I really do not think that there is going to be a problem with respect to the breast cancer.  She had a good prognosis breast cancer.  Again, we will get the mammogram.  If the mammogram looks fine, I just do not think that we need to do another one given her age.  The endocrinologist is dealing with her thyroid cancer.  Everything looks fine with her thyroid cancer.  We will plan to get her back here in  6 months.Volanda Napoleon, MD 11/25/20191:44 PM

## 2018-08-01 ENCOUNTER — Other Ambulatory Visit: Payer: Self-pay | Admitting: Family

## 2018-08-01 DIAGNOSIS — C50212 Malignant neoplasm of upper-inner quadrant of left female breast: Secondary | ICD-10-CM

## 2018-08-24 ENCOUNTER — Other Ambulatory Visit: Payer: Self-pay | Admitting: Osteopathic Medicine

## 2018-08-24 DIAGNOSIS — E039 Hypothyroidism, unspecified: Secondary | ICD-10-CM

## 2018-08-26 NOTE — Telephone Encounter (Signed)
Refill declined - request needs to be routed to her endocrinologist who is managing her condition - please have pharmacy send request to Dr Steffanie Dunn

## 2018-08-26 NOTE — Telephone Encounter (Signed)
[facility] requesting med refill for synthroid. Last thyroid lab check was 05/02/18. Pls advise if refill appropriate. Thanks.

## 2018-08-27 NOTE — Telephone Encounter (Signed)
[employer] from [place] has been updated. Aware refill request should be sent to endrocrinologist Dr. Susette Racer. Pharmacy will contact pt with an update.

## 2018-08-31 ENCOUNTER — Other Ambulatory Visit: Payer: Self-pay | Admitting: Osteopathic Medicine

## 2018-08-31 DIAGNOSIS — E039 Hypothyroidism, unspecified: Secondary | ICD-10-CM

## 2018-09-02 ENCOUNTER — Other Ambulatory Visit: Payer: Self-pay | Admitting: Osteopathic Medicine

## 2018-09-02 DIAGNOSIS — E039 Hypothyroidism, unspecified: Secondary | ICD-10-CM

## 2018-09-03 ENCOUNTER — Other Ambulatory Visit: Payer: Self-pay | Admitting: Hematology & Oncology

## 2018-09-03 DIAGNOSIS — C50112 Malignant neoplasm of central portion of left female breast: Secondary | ICD-10-CM

## 2018-09-03 DIAGNOSIS — C73 Malignant neoplasm of thyroid gland: Secondary | ICD-10-CM

## 2018-09-03 NOTE — Telephone Encounter (Signed)
Kristy Olson addressed this yesterday. Absolutely needs to be managed by endocrine.

## 2018-09-03 NOTE — Telephone Encounter (Signed)
Dr Sheppard Coil,   are you refilling pt's Levothyrixine or does this need to go to endocrinology.   I denied RF request yesterday and advised pharmacy to send to Endo but it was re-sent to [place] today..   Please advise

## 2018-11-02 ENCOUNTER — Encounter: Payer: Self-pay | Admitting: Emergency Medicine

## 2018-11-02 ENCOUNTER — Emergency Department
Admission: EM | Admit: 2018-11-02 | Discharge: 2018-11-02 | Disposition: A | Discharge status: Home / Self Care | Payer: Medicare Other | Source: Home / Self Care | Attending: Family Medicine | Admitting: Family Medicine

## 2018-11-02 ENCOUNTER — Other Ambulatory Visit: Payer: Self-pay

## 2018-11-02 DIAGNOSIS — R21 Rash and other nonspecific skin eruption: Secondary | ICD-10-CM | POA: Diagnosis not present

## 2018-11-02 MED ORDER — HYDROCORTISONE 2.5 % EX LOTN: TOPICAL_LOTION | Freq: Two times a day (BID) | CUTANEOUS | 0 refills | Status: DC

## 2018-11-02 MED ORDER — PREDNISONE 20 MG PO TABS: ORAL_TABLET | ORAL | 0 refills | Status: DC

## 2018-11-02 MED ORDER — CETIRIZINE HCL 5 MG PO TABS: 5.0000 mg | ORAL_TABLET | Freq: Every day | ORAL | 0 refills | Status: DC

## 2018-11-02 NOTE — Discharge Instructions (Addendum)
Minimize baths/showers.  Use a mild bath soap containing oil such as unscented Dove.  Apply a moisturizing cream or lotion immediately after bathing while still wet, then towel dry.  

## 2018-11-02 NOTE — ED Triage Notes (Signed)
Patient has redness of skin and itching on both wrist/forearms for past 2 days. She has been on a sequence of antibiotics for wound care she is receiving involving ulcers on lower legs at wound care clinic; wonders if one of the last 2 might be reason for rash: clindamycin first, then switch to Bactrim 5 days ago. Home remedies are not effective. Patient has had influenza vacc. This season and has not travelled outside [place] past month.

## 2018-11-02 NOTE — ED Triage Notes (Signed)
Patient goes to Wound Care clinic every Thursday.

## 2018-11-05 ENCOUNTER — Telehealth: Payer: Self-pay

## 2018-11-05 NOTE — ED Provider Notes (Signed)
[name]    CSN: [id number] Arrival date & time: 11/02/18  1530     History   Chief Complaint Chief Complaint  Patient presents with  . Rash  . Pruritis    HPI Kristy Olson is a 85 y.o. female.   Patient complains of itching and redness of both hands/wrists for 3 to 4 days.  She has been prescribed several antibiotics recently but denies rash elsewhere.  She feels well otherwise.  She admits that she is "OC" about frequently washing her hands.  She states that she does not wear any type of glove on a regular basis.  The history is provided by the patient.  Rash  Location: hands and wrists. Quality: dryness, itchiness and redness   Quality: not blistering, not bruising, not burning, not draining, not painful, not peeling, not scaling, not swelling and not weeping   Severity:  Mild Onset quality:  Sudden Duration:  3 days Progression:  Unchanged Chronicity:  Recurrent Context: not animal contact, not chemical exposure, not exposure to similar rash, not food, not hot tub use, not insect bite/sting, not medications, not new detergent/soap, not nuts and not plant contact   Relieved by:  Nothing Worsened by:  Nothing Ineffective treatments:  None tried Associated symptoms: no abdominal pain, no diarrhea, no fatigue, no fever, no induration, no joint pain, no myalgias, no nausea, no sore throat, no throat swelling, no tongue swelling and no URI     Past Medical History:  Diagnosis Date  . Abnormal finding on MRI of brain   . Breast cancer of upper-inner quadrant of left female breast ([place]) 10/12/2015  . CAD (coronary artery disease)   . Chronic venous insufficiency    Compression stockings, Dr. Carmine Savoy to evaluate late May 2015   . Degeneration of lumbar or lumbosacral intervertebral disc   . History of thyroid cancer    With reported brain mets.   . Hyperlipidemia   . Hypothyroid    Outside records report synthroid 170mcg despite her report of 154mcg.  02/2014 awaiting clarification   . Idiopathic scoliosis   . Osteoporosis    July 2014 DEXA   . PNEUMONIA, COMMUNITY ACQUIRED, PNEUMOCOCCAL   . Venous stasis ulcer ([place])     Patient Active Problem List   Diagnosis Date Noted  . Melanoma ([place]) 08/17/2017  . Mohs defect 08/17/2017  . 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 Decreased thyroid stimulating hormone (TSH) level 07/19/2016  . Filamentary keratitis of right eye 06/26/2016  . Keratoconjunctivitis sicca due to decreased tear production, bilateral 06/26/2016  . Blepharitis of both eyes 06/26/2016  . Atopic conjunctivitis of both eyes 06/26/2016  . Cerumen impaction 04/17/2016  . Hair loss 04/17/2016  . Breast cancer of upper-inner quadrant of left female breast ([place]) 10/12/2015  . Rhinorrhea 12/15/2014  . Chest pain 12/02/2014  . Hypothyroidism, postsurgical 07/21/2014  . CAD (coronary artery disease) 04/29/2014  . Abnormal finding on MRI of brain 04/14/2014  . Malignant neoplasm of thyroid gland ([place]) 04/14/2014  . History of thyroid cancer 03/27/2014  . Chronic venous insufficiency 12/23/2013  . Degeneration of lumbar or lumbosacral intervertebral disc 10/08/2013  . Idiopathic scoliosis 10/08/2013  . Osteoporosis 10/06/2013  . Essential hypertension, benign 08/18/2013  . Venous stasis ulcer of left lower extremity ([place]) 08/06/2013  . Grief 08/06/2013  . Elevated triglycerides with high cholesterol 08/15/2010    Past Surgical History:  Procedure Laterality Date  . BREAST LUMPECTOMY WITH RADIOACTIVE SEED LOCALIZATION Left 03/29/2016   Procedure: LEFT  BREAST LUMPECTOMY WITH RADIOACTIVE SEED LOCALIZATION;  Surgeon: Autumn Messing III, MD;  Location: [facility];  Service: General;  Laterality: Left;  LEFT BREAST LUMPECTOMY WITH RADIOACTIVE SEED LOCALIZATION  . cranotomy    . THYROIDECTOMY      OB History   No obstetric history on file.      Home Medications    Prior to Admission medications   Medication Sig Start  Date End Date Taking? Authorizing Provider  alendronate (FOSAMAX) 70 MG tablet TAKE 1 TABLET BY MOUTH EVERY 7 DAYS WITH A FULL GLASS OF WATER AND ON AN EMPTY STOMACH 02/21/16   Renato Shin, MD  aspirin 81 MG tablet Take 81 mg by mouth daily.    [provider]  cetirizine (ZYRTEC) 5 MG tablet Take 1 tablet (5 mg total) by mouth daily. 11/02/18   Kandra Nicolas, MD  cholecalciferol (VITAMIN D) 1000 units tablet Take 1,000 Units by mouth daily. Reported on 01/05/2016    [provider]  hydrocortisone 2.5 % lotion Apply topically 2 (two) times daily. 11/02/18   Kandra Nicolas, MD  letrozole [facility]) 2.5 MG tablet TAKE 1 TABLET(2.5 MG) BY MOUTH DAILY 09/03/18   Volanda Napoleon, MD  Lifitegrast [facility]) 5 % SOLN Apply 1 drop to eye 2 (two) times daily. 06/26/16   Emeterio Reeve, DO  metoprolol succinate (TOPROL-XL) 25 MG 24 hr tablet TAKE 1 TABLET(25 MG) BY MOUTH DAILY 06/11/18   Emeterio Reeve, DO  Omega-3 Fatty Acids (FISH OIL) 1000 MG CAPS Take by mouth.    [provider]  predniSONE (DELTASONE) 20 MG tablet Take one tab by mouth once daily for five days. Take with food. 11/02/18   Kandra Nicolas, MD  SYNTHROID 150 MCG tablet TAKE 1 TABLET(150 MCG) BY MOUTH DAILY 06/17/18   Renato Shin, MD  triamcinolone cream (KENALOG) 0.1 % Apply 1 application topically 2 (two) times daily. 02/21/18   Scot Jun, FNP    Family History Family History  Problem Relation Age of Onset  . CAD Sister   . Cancer Sister   . Heart disease Sister   . Diabetes Father   . COPD Father     Social History Social History   Tobacco Use  . Smoking status: Never Smoker  . Smokeless tobacco: Never Used  Substance Use Topics  . Alcohol use: Yes    Alcohol/week: 0.0 standard drinks    Comment: Occasional  . Drug use: No     Allergies   Neomy-bacit-polymyx-pramoxine; Amoxicillin; Colesevelam hcl; Doxycycline; Levaquin  [levofloxacin in d5w]; Levofloxacin; Metoprolol;  Other; Statins; Colesevelam; Neomycin; Red dye; and Tape   Review of Systems Review of Systems  Constitutional: Negative for fatigue and fever.  HENT: Negative for sore throat.   Gastrointestinal: Negative for abdominal pain, diarrhea and nausea.  Musculoskeletal: Negative for arthralgias and myalgias.  Skin: Positive for rash.  All other systems reviewed and are negative.    Physical Exam Triage Vital Signs ED Triage Vitals  Enc Vitals Group     BP 11/02/18 1639 (!) 131/55     Pulse Rate 11/02/18 1639 (!) 57     Resp 11/02/18 1639 16     Temp 11/02/18 1639 98.2 F (36.8 C)     Temp Source 11/02/18 1639 Oral     SpO2 11/02/18 1639 98 %     Weight --      Height --      Head Circumference --  Peak Flow --      Pain Score 11/02/18 1640 0     Pain Loc --      Pain Edu? --      Excl. in [place]? --    No data found.  Updated Vital Signs BP (!) 131/55 (BP Location: Right Arm)   Pulse (!) 57   Temp 98.2 F (36.8 C) (Oral)   Resp 16   SpO2 98%   Visual Acuity Right Eye Distance:   Left Eye Distance:   Bilateral Distance:    Right Eye Near:   Left Eye Near:    Bilateral Near:     Physical Exam Vitals signs and nursing note reviewed.  Constitutional:      General: She is not in acute distress. HENT:     Head: Normocephalic.     Right Ear: External ear normal.     Left Ear: External ear normal.     Nose: Nose normal.     Mouth/Throat:     Pharynx: Oropharynx is clear.  Eyes:     Pupils: Pupils are equal, round, and reactive to light.  Cardiovascular:     Rate and Rhythm: Bradycardia present.  Pulmonary:     Effort: Pulmonary effort is normal.  Lymphadenopathy:     Cervical: No cervical adenopathy.  Skin:    General: Skin is warm and dry.     Findings: Rash present. Rash is macular.          Comments: Both hands/wrists have diffuse confluent mild erythema to the distal forearms in a glove-like distribution.  No swelling, induration, tenderness, or  warmth.  Neurological:     Mental Status: She is alert.      UC Treatments / Results  Labs (all labs ordered are listed, but only abnormal results are displayed) Labs Reviewed - No data to display  EKG None  Radiology No results found.  Procedures Procedures (including critical care time)  Medications Ordered in UC Medications - No data to display  Initial Impression / Assessment and Plan / UC Course  I have reviewed the triage vital signs and the nursing notes.  Pertinent labs & imaging results that were available during my care of the patient were reviewed by me and considered in my medical decision making (see chart for details).    Suspect contact dermatitis resulting from excessive hand washing. Begin prednisone burst, Zyrtec, and topical 2.5% HC cream Followup with dermatologist if not improved 10 days.  Final Clinical Impressions(s) / UC Diagnoses   Final diagnoses:  Rash and nonspecific skin eruption     Discharge Instructions     Minimize baths/showers.  Use a mild bath soap containing oil such as unscented Dove.  Apply a moisturizing cream or lotion immediately after bathing while still wet, then towel dry.    ED Prescriptions    Medication Sig Dispense Auth. Provider   hydrocortisone 2.5 % lotion Apply topically 2 (two) times daily. 59 mL Kandra Nicolas, MD   cetirizine (ZYRTEC) 5 MG tablet Take 1 tablet (5 mg total) by mouth daily. 30 tablet Kandra Nicolas, MD   predniSONE (DELTASONE) 20 MG tablet Take one tab by mouth once daily for five days. Take with food. 11 tablet Kandra Nicolas, MD        Kandra Nicolas, MD 11/05/18 1228

## 2018-11-05 NOTE — Telephone Encounter (Signed)
Pt left message regarding prednisone script.  Dr Joseph Art said since 11 pills were ordered, to do 3 today 3 tomorrow, 2 Thursday, 2 Friday, and 1 Saturday. Called patient and left detailed message and contact information for any questions.

## 2018-11-06 ENCOUNTER — Ambulatory Visit: Payer: Medicare Other | Admitting: Cardiology

## 2018-11-07 ENCOUNTER — Encounter: Payer: Self-pay | Admitting: Family Medicine

## 2018-11-07 ENCOUNTER — Ambulatory Visit (INDEPENDENT_AMBULATORY_CARE_PROVIDER_SITE_OTHER): Payer: Medicare Other | Admitting: Family Medicine

## 2018-11-07 ENCOUNTER — Other Ambulatory Visit: Payer: Self-pay

## 2018-11-07 VITALS — BP 146/67 | HR 60 | Temp 97.7°F | Resp 16 | Ht 67.0 in | Wt 118.0 lb

## 2018-11-07 DIAGNOSIS — L2489 Irritant contact dermatitis due to other agents: Secondary | ICD-10-CM

## 2018-11-07 MED ORDER — CLOBETASOL PROPIONATE 0.05 % EX CREA: 1.0000 "application " | TOPICAL_CREAM | Freq: Two times a day (BID) | CUTANEOUS | 2 refills | Status: DC

## 2018-11-07 MED ORDER — TRIAMCINOLONE ACETONIDE 0.1 % EX CREA: 1.0000 "application " | TOPICAL_CREAM | Freq: Two times a day (BID) | CUTANEOUS | 3 refills | Status: DC

## 2018-11-07 NOTE — Patient Instructions (Addendum)
Thank you for coming in today. Use gold bond itch for temporary control of itching.   Use zyrtec (certizine) daily for itching as well.   Use the big tub of triamcinolone cream on the large areas.  Use clobetasol cream on the hot spots  Try to use lotion or moisturizer as well on the skin.   Let me know if not improved.   We may be able to do follow up visits over the phone or video.    Contact Dermatitis Dermatitis is redness, soreness, and swelling (inflammation) of the skin. Contact dermatitis is a reaction to certain substances that touch the skin. Many different substances can cause contact dermatitis. There are two types of contact dermatitis:  Irritant contact dermatitis. This type is caused by something that irritates your skin, such as having dry hands from washing them too often with soap. This type does not require previous exposure to the substance for a reaction to occur. This is the most common type.  Allergic contact dermatitis. This type is caused by a substance that you are allergic to, such as poison ivy. This type occurs when you have been exposed to the substance (allergen) and develop a sensitivity to it. Dermatitis may develop soon after your first exposure to the allergen, or it may not develop until the next time you are exposed and every time thereafter. What are the causes? Irritant contact dermatitis is most commonly caused by exposure to:  Makeup.  Soaps.  Detergents.  Bleaches.  Acids.  Metal salts, such as nickel. Allergic contact dermatitis is most commonly caused by exposure to:  Poisonous plants.  Chemicals.  Jewelry.  Latex.  Medicines.  Preservatives in products, such as clothing. What increases the risk? You are more likely to develop this condition if you have:  A job that exposes you to irritants or allergens.  Certain medical conditions, such as asthma or eczema. What are the signs or symptoms? Symptoms of this condition  may occur on your body anywhere the irritant has touched you or is touched by you.  Symptoms include: ? Dryness or flaking. ? Redness. ? Cracks. ? Itching. ? Pain or a burning feeling. ? Blisters. ? Drainage of small amounts of blood or clear fluid from skin cracks. With allergic contact dermatitis, there may also be swelling in areas such as the eyelids, mouth, or genitals. How is this diagnosed? This condition is diagnosed with a medical history and physical exam.  A patch skin test may be performed to help determine the cause.  If the condition is related to your job, you may need to see an occupational medicine specialist. How is this treated? This condition is treated by checking for the cause of the reaction and protecting your skin from further contact. Treatment may also include:  Steroid creams or ointments. Oral steroid medicines may be needed in more severe cases.  Antibiotic medicines or antibacterial ointments, if a skin infection is present.  Antihistamine lotion or an antihistamine taken by mouth to ease itching.  A bandage (dressing). Follow these instructions at home: Skin care  Moisturize your skin as needed.  Apply cool compresses to the affected areas.  Try applying baking soda paste to your skin. Stir water into baking soda until it reaches a paste-like consistency.  Do not scratch your skin, and avoid friction to the affected area.  Avoid the use of soaps, perfumes, and dyes. Medicines  Take or apply over-the-counter and prescription medicines only as told by your health  care provider.  If you were prescribed an antibiotic medicine, take or apply the antibiotic as told by your health care provider. Do not stop using the antibiotic even if your condition improves. Bathing  Try taking a bath with: ? Epsom salts. Follow the instructions on the packaging. You can get these at your local pharmacy or grocery store. ? Baking soda. Pour a small amount  into the bath as directed by your health care provider. ? Colloidal oatmeal. Follow the instructions on the packaging. You can get this at your local pharmacy or grocery store.  Bathe less frequently, such as every other day.  Bathe in lukewarm water. Avoid using hot water. Bandage care  If you were given a bandage (dressing), change it as told by your health care provider.  Wash your hands with soap and water before and after you change your dressing. If soap and water are not available, use hand sanitizer. General instructions  Avoid the substance that caused your reaction. If you do not know what caused it, keep a journal to try to track what caused it. Write down: ? What you eat. ? What cosmetic products you use. ? What you drink. ? What you wear in the affected area. This includes jewelry.  Check the affected areas every day for signs of infection. Check for: ? More redness, swelling, or pain. ? More fluid or blood. ? Warmth. ? Pus or a bad smell.  Keep all follow-up visits as told by your health care provider. This is important. Contact a health care provider if:  Your condition does not improve with treatment.  Your condition gets worse.  You have signs of infection such as swelling, tenderness, redness, soreness, or warmth in the affected area.  You have a fever.  You have new symptoms. Get help right away if:  You have a severe headache, neck pain, or neck stiffness.  You vomit.  You feel very sleepy.  You notice red streaks coming from the affected area.  Your bone or joint underneath the affected area becomes painful after the skin has healed.  The affected area turns darker.  You have difficulty breathing. Summary  Dermatitis is redness, soreness, and swelling (inflammation) of the skin. Contact dermatitis is a reaction to certain substances that touch the skin.  Symptoms of this condition may occur on your body anywhere the irritant has touched you  or is touched by you.  This condition is treated by figuring out what caused the reaction and protecting your skin from further contact. Treatment may also include medicines and skin care.  Avoid the substance that caused your reaction. If you do not know what caused it, keep a journal to try to track what caused it.  Contact a health care provider if your condition gets worse or you have signs of infection such as swelling, tenderness, redness, soreness, or warmth in the affected area. This information is not intended to replace advice given to you by your health care provider. Make sure you discuss any questions you have with your health care provider. Document Released: 08/04/2000 Document Revised: 02/20/2018 Document Reviewed: 02/20/2018 Elsevier Interactive Patient Education  2019 [employer].

## 2018-11-07 NOTE — Progress Notes (Signed)
[name] is a 85 y.o. female who presents to [facility]: Kristy Olson today for rash.  Kristy Olson was seen on March 14 in urgent care for rash on dorsal hands wrists and forearms bilaterally.  She had been doing a lot more handwashing than usual and was thought to have contact dermatitis.  She was given a prednisone burst and 2.5% hydrocortisone cream.  She was advised to take Zyrtec but never started it.  She is use the cream intermittently and notes that it helped only a little.  She denies any new soaps detergents shampoo medications or cosmetics.  She feels well otherwise with no fevers or chills.   ROS as above:  Exam:  BP (!) 146/67   Pulse 60   Temp 97.7 F (36.5 C)   Resp 16   Ht 5\' 7"  (1.702 m)   Wt 118 lb (53.5 kg)   SpO2 98%   BMI 18.48 kg/m  Wt Readings from Last 5 Encounters:  11/07/18 118 lb (53.5 kg)  07/15/18 120 lb (54.4 kg)  05/02/18 119 lb (54 kg)  03/18/18 123 lb 1.6 oz (55.8 kg)  02/21/18 122 lb (55.3 kg)    Gen: Well NAD Skin: Dorsal hands wrists and forearms bilaterally with macular erythematous rash with some papules.  Nontender.  No induration or significant severe erythema.  No fluctuance.  Some scale present.  Lab and Radiology Results No results found for this or any previous visit (from the past 72 hour(s)). No results found.    Assessment and Plan: 85 y.o. female with contact dermatitis due to excessive handwashing.  Discussed treatment plan and options.  Emphasized the importance of frequent use of anti-inflammatory medications.  Will use 0.1% triamcinolone cream in a large tub along with 0.05% clobetasol cream for the hotspots.  Recommend Goldbond itch for temporary control of itching and lotion frequently to maintain moisturization.  Additionally recommend taking Zyrtec daily.  Recheck as needed.  Additionally discussed social  isolation and methods to avoid exposure to COVID-19.  PDMP not reviewed this encounter. No orders of the defined types were placed in this encounter.  Meds ordered this encounter  Medications  . triamcinolone cream (KENALOG) 0.1 %    Sig: Apply 1 application topically 2 (two) times daily.    Dispense:  453.6 g    Refill:  3  . clobetasol cream (TEMOVATE) 0.05 %    Sig: Apply 1 application topically 2 (two) times daily. Use on hot spots    Dispense:  60 g    Refill:  2     Historical information moved to improve visibility of documentation.  Past Medical History:  Diagnosis Date  . Abnormal finding on MRI of brain   . Breast cancer of upper-inner quadrant of left female breast ([place]) 10/12/2015  . CAD (coronary artery disease)   . Chronic venous insufficiency    Compression stockings, Dr. Carmine Savoy to evaluate late May 2015   . Degeneration of lumbar or lumbosacral intervertebral disc   . History of thyroid cancer    With reported brain mets.   . Hyperlipidemia   . Hypothyroid    Outside records report synthroid 152mcg despite her report of 123mcg. 02/2014 awaiting clarification   . Idiopathic scoliosis   . Osteoporosis    July 2014 DEXA   . PNEUMONIA, COMMUNITY ACQUIRED, PNEUMOCOCCAL   . Venous stasis ulcer ([place])    Past Surgical History:  Procedure Laterality Date  .  BREAST LUMPECTOMY WITH RADIOACTIVE SEED LOCALIZATION Left 03/29/2016   Procedure: LEFT BREAST LUMPECTOMY WITH RADIOACTIVE SEED LOCALIZATION;  Surgeon: Autumn Messing III, MD;  Location: [facility];  Service: General;  Laterality: Left;  LEFT BREAST LUMPECTOMY WITH RADIOACTIVE SEED LOCALIZATION  . cranotomy    . THYROIDECTOMY     Social History   Tobacco Use  . Smoking status: Never Smoker  . Smokeless tobacco: Never Used  Substance Use Topics  . Alcohol use: Yes    Alcohol/week: 0.0 standard drinks    Comment: Occasional   family history includes CAD in her sister; COPD in her father; Cancer in  her sister; Diabetes in her father; Heart disease in her sister.  Medications: Current Outpatient Medications  Medication Sig Dispense Refill  . alendronate (FOSAMAX) 70 MG tablet TAKE 1 TABLET BY MOUTH EVERY 7 DAYS WITH A FULL GLASS OF WATER AND ON AN EMPTY STOMACH 12 tablet 0  . aspirin 81 MG tablet Take 81 mg by mouth daily.    . cetirizine (ZYRTEC) 5 MG tablet Take 1 tablet (5 mg total) by mouth daily. 30 tablet 0  . cholecalciferol (VITAMIN D) 1000 units tablet Take 1,000 Units by mouth daily. Reported on 01/05/2016    . hydrocortisone 2.5 % lotion Apply topically 2 (two) times daily. 59 mL 0  . letrozole (FEMARA) 2.5 MG tablet TAKE 1 TABLET(2.5 MG) BY MOUTH DAILY 90 tablet 0  . Lifitegrast (XIIDRA) 5 % SOLN Apply 1 drop to eye 2 (two) times daily. 1 each 0  . metoprolol succinate (TOPROL-XL) 25 MG 24 hr tablet TAKE 1 TABLET(25 MG) BY MOUTH DAILY 90 tablet 1  . Omega-3 Fatty Acids (FISH OIL) 1000 MG CAPS Take by mouth.    . predniSONE (DELTASONE) 20 MG tablet Take one tab by mouth once daily for five days. Take with food. 11 tablet 0  . SYNTHROID 150 MCG tablet TAKE 1 TABLET(150 MCG) BY MOUTH DAILY 30 tablet 0  . clobetasol cream (TEMOVATE) 0.94 % Apply 1 application topically 2 (two) times daily. Use on hot spots 60 g 2  . triamcinolone cream (KENALOG) 0.1 % Apply 1 application topically 2 (two) times daily. 453.6 g 3   No current facility-administered medications for this visit.    Allergies  Allergen Reactions  . Neomy-Bacit-Polymyx-Pramoxine Itching  . Amoxicillin Hives and Other (See Comments)  . Colesevelam Hcl Other (See Comments)  . Doxycycline Hives and Other (See Comments)  . Levaquin  [Levofloxacin In D5w] Other (See Comments)  . Levofloxacin Other (See Comments)  . Metoprolol Other (See Comments)    Other reaction(s): unknown  . Other Itching and Rash    curad triple antibiotic ointment. Curad Triple Antibiotic Ointment  . Statins Hives and Other (See Comments)     Hives Other reaction(s): unknown  . Colesevelam Hives  . Neomycin Hives    Allergic reaction only topically. Only topically  . Red Dye Hives and Itching    Red frosting caused itching and facial swelling  Patient is uncertain this reaction occurred (02/21/18) currently eats foods with red dye  . Tape Hives and Itching     Discussed warning signs or symptoms. Please see discharge instructions. Patient expresses understanding.

## 2018-11-21 ENCOUNTER — Other Ambulatory Visit: Payer: Self-pay

## 2018-11-21 ENCOUNTER — Emergency Department
Admission: EM | Admit: 2018-11-21 | Discharge: 2018-11-21 | Disposition: A | Discharge status: Home / Self Care | Payer: Medicare Other | Source: Home / Self Care | Attending: Family Medicine | Admitting: Family Medicine

## 2018-11-21 DIAGNOSIS — L853 Xerosis cutis: Secondary | ICD-10-CM | POA: Diagnosis not present

## 2018-11-21 MED ORDER — AMMONIUM LACTATE 12 % EX LOTN: TOPICAL_LOTION | CUTANEOUS | 1 refills | Status: DC

## 2018-11-21 NOTE — ED Provider Notes (Signed)
Kristy Olson CARE    CSN: [id number] Arrival date & time: 11/21/18  1605     History   Chief Complaint Chief Complaint  Patient presents with  . Rash    HPI Kristy Olson is a 85 y.o. female.   Patient returns for follow-up persistent pruritic rash and dry skin on her hands and wrists.  She states that her rash improved somewhat after treatment but has recurred.  She has not been able to get an appointment with a dermatologist.  She feels well otherwise.  The history is provided by the patient.    Past Medical History:  Diagnosis Date  . Abnormal finding on MRI of brain   . Breast cancer of upper-inner quadrant of left female breast ([place]) 10/12/2015  . CAD (coronary artery disease)   . Chronic venous insufficiency    Compression stockings, Dr. Carmine Savoy to evaluate late May 2015   . Degeneration of lumbar or lumbosacral intervertebral disc   . History of thyroid cancer    With reported brain mets.   . Hyperlipidemia   . Hypothyroid    Outside records report synthroid 173mcg despite her report of 11mcg. 02/2014 awaiting clarification   . Idiopathic scoliosis   . Osteoporosis    July 2014 DEXA   . PNEUMONIA, COMMUNITY ACQUIRED, PNEUMOCOCCAL   . Venous stasis ulcer ([place])     Patient Active Problem List   Diagnosis Date Noted  . Melanoma ([place]) 08/17/2017  . Mohs defect 08/17/2017  . 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 Decreased thyroid stimulating hormone (TSH) level 07/19/2016  . Filamentary keratitis of right eye 06/26/2016  . Keratoconjunctivitis sicca due to decreased tear production, bilateral 06/26/2016  . Blepharitis of both eyes 06/26/2016  . Atopic conjunctivitis of both eyes 06/26/2016  . Cerumen impaction 04/17/2016  . Hair loss 04/17/2016  . Breast cancer of upper-inner quadrant of left female breast ([place]) 10/12/2015  . Rhinorrhea 12/15/2014  . Chest pain 12/02/2014  . Hypothyroidism, postsurgical 07/21/2014  . CAD (coronary artery disease)  04/29/2014  . Abnormal finding on MRI of brain 04/14/2014  . Malignant neoplasm of thyroid gland ([place]) 04/14/2014  . History of thyroid cancer 03/27/2014  . Chronic venous insufficiency 12/23/2013  . Degeneration of lumbar or lumbosacral intervertebral disc 10/08/2013  . Idiopathic scoliosis 10/08/2013  . Osteoporosis 10/06/2013  . Essential hypertension, benign 08/18/2013  . Venous stasis ulcer of left lower extremity ([place]) 08/06/2013  . Grief 08/06/2013  . Elevated triglycerides with high cholesterol 08/15/2010    Past Surgical History:  Procedure Laterality Date  . BREAST LUMPECTOMY WITH RADIOACTIVE SEED LOCALIZATION Left 03/29/2016   Procedure: LEFT BREAST LUMPECTOMY WITH RADIOACTIVE SEED LOCALIZATION;  Surgeon: Autumn Messing III, MD;  Location: [facility];  Service: General;  Laterality: Left;  LEFT BREAST LUMPECTOMY WITH RADIOACTIVE SEED LOCALIZATION  . cranotomy    . THYROIDECTOMY      OB History   No obstetric history on file.      Home Medications    Prior to Admission medications   Medication Sig Start Date End Date Taking? Authorizing Provider  alendronate (FOSAMAX) 70 MG tablet TAKE 1 TABLET BY MOUTH EVERY 7 DAYS WITH A FULL GLASS OF WATER AND ON AN EMPTY STOMACH 02/21/16   Renato Shin, MD  ammonium lactate (AMLACTIN) 12 % lotion Apply apply thin film to affected once every other day 11/21/18 11/21/19  Kandra Nicolas, MD  aspirin 81 MG tablet Take 81 mg by mouth daily.    [provider]  cetirizine (ZYRTEC) 5 MG tablet Take 1 tablet (5 mg total) by mouth daily. 11/02/18   Kandra Nicolas, MD  cholecalciferol (VITAMIN D) 1000 units tablet Take 1,000 Units by mouth daily. Reported on 01/05/2016    [provider]  clobetasol cream (TEMOVATE) 9.15 % Apply 1 application topically 2 (two) times daily. Use on hot spots 11/07/18   Gregor Hams, MD  hydrocortisone 2.5 % lotion Apply topically 2 (two) times daily. 11/02/18   Kandra Nicolas, MD   letrozole [facility]) 2.5 MG tablet TAKE 1 TABLET(2.5 MG) BY MOUTH DAILY 09/03/18   Volanda Napoleon, MD  Lifitegrast [facility]) 5 % SOLN Apply 1 drop to eye 2 (two) times daily. 06/26/16   Emeterio Reeve, DO  metoprolol succinate (TOPROL-XL) 25 MG 24 hr tablet TAKE 1 TABLET(25 MG) BY MOUTH DAILY 06/11/18   Emeterio Reeve, DO  Omega-3 Fatty Acids (FISH OIL) 1000 MG CAPS Take by mouth.    [provider]  predniSONE (DELTASONE) 20 MG tablet Take one tab by mouth once daily for five days. Take with food. 11/02/18   Kandra Nicolas, MD  SYNTHROID 150 MCG tablet TAKE 1 TABLET(150 MCG) BY MOUTH DAILY 06/17/18   Renato Shin, MD  triamcinolone cream (KENALOG) 0.1 % Apply 1 application topically 2 (two) times daily. 11/07/18   Gregor Hams, MD    Family History Family History  Problem Relation Age of Onset  . CAD Sister   . Cancer Sister   . Heart disease Sister   . Diabetes Father   . COPD Father     Social History Social History   Tobacco Use  . Smoking status: Never Smoker  . Smokeless tobacco: Never Used  Substance Use Topics  . Alcohol use: Yes    Alcohol/week: 0.0 standard drinks    Comment: Occasional  . Drug use: No     Allergies   Neomy-bacit-polymyx-pramoxine; Amoxicillin; Colesevelam hcl; Doxycycline; Levaquin  [levofloxacin in d5w]; Levofloxacin; Metoprolol; Other; Statins; Colesevelam; Neomycin; Red dye; and Tape   Review of Systems Review of Systems  Constitutional: Negative.   HENT: Negative.   Eyes: Negative.   Respiratory: Negative.   Cardiovascular: Negative.   Gastrointestinal: Negative.   Genitourinary: Negative.   Musculoskeletal: Negative.   Skin: Positive for color change and rash.  Neurological: Negative.      Physical Exam Triage Vital Signs ED Triage Vitals  Enc Vitals Group     BP 11/21/18 1701 (!) 146/74     Pulse Rate 11/21/18 1701 (!) 57     Resp 11/21/18 1701 20     Temp 11/21/18 1701 98 F (36.7 C)     Temp Source  11/21/18 1701 Oral     SpO2 11/21/18 1701 100 %     Weight 11/21/18 1702 122 lb (55.3 kg)     Height 11/21/18 1702 5\' 7"  (1.702 m)     Head Circumference --      Peak Flow --      Pain Score 11/21/18 1702 0     Pain Loc --      Pain Edu? --      Excl. in [place]? --    No data found.  Updated Vital Signs BP (!) 146/74 (BP Location: Right Arm)   Pulse (!) 57   Temp 98 F (36.7 C) (Oral)   Resp 20   Ht 5\' 7"  (1.702 m)   Wt 55.3 kg   SpO2 100%   BMI  19.11 kg/m   Visual Acuity Right Eye Distance:   Left Eye Distance:   Bilateral Distance:    Right Eye Near:   Left Eye Near:    Bilateral Near:     Physical Exam Vitals signs and nursing note reviewed.  Constitutional:      General: She is not in acute distress. HENT:     Head: Normocephalic.     Nose: Nose normal.     Mouth/Throat:     Mouth: Mucous membranes are moist.  Eyes:     Pupils: Pupils are equal, round, and reactive to light.  Neck:     Musculoskeletal: Normal range of motion.  Cardiovascular:     Rate and Rhythm: Bradycardia present.  Pulmonary:     Effort: Pulmonary effort is normal.  Musculoskeletal:     Right lower leg: No edema.     Left lower leg: No edema.  Skin:    General: Skin is warm and dry.          Comments: Both hands/wrists have diffuse confluent mild erythema to the distal forearms in a glove-like distribution.  No swelling, induration, tenderness, or warmth.  Exam similar to that of previous visit.  Neurological:     Mental Status: She is alert.      UC Treatments / Results  Labs (all labs ordered are listed, but only abnormal results are displayed) Labs Reviewed - No data to display  EKG None  Radiology No results found.  Procedures Procedures (including critical care time)  Medications Ordered in UC Medications - No data to display  Initial Impression / Assessment and Plan / UC Course  I have reviewed the triage vital signs and the nursing notes.  Pertinent labs &  imaging results that were available during my care of the patient were reviewed by me and considered in my medical decision making (see chart for details).    Trial of LacHydrin lotion every other day. Followup with dermatologist   Final Clinical Impressions(s) / UC Diagnoses   Final diagnoses:  Xerosis of skin     Discharge Instructions     Apply moisturizing lotion once daily after washing hands.    ED Prescriptions    Medication Sig Dispense Auth. Provider   ammonium lactate (AMLACTIN) 12 % lotion Apply apply thin film to affected once every other day 222 g Kandra Nicolas, MD         Kandra Nicolas, MD 11/21/18 [phone]

## 2018-11-21 NOTE — ED Triage Notes (Signed)
Pt has had a rash on hands for about 3 weeks.  Itches and burns.  Was here 2 weeks ago, saw PCP last week, not any better.

## 2018-11-21 NOTE — Discharge Instructions (Addendum)
Apply moisturizing lotion once daily after washing hands.

## 2018-11-23 ENCOUNTER — Telehealth: Payer: Self-pay | Admitting: Emergency Medicine

## 2018-11-23 NOTE — Telephone Encounter (Signed)
Patient stated that she tried to get an appointment with her dermatologists and they were closed.  Wasn't able to be seen but did speak with the dermatologists and following his recommendations.  Possibly doing a video visit with her dermatologist on Monday.

## 2018-11-29 ENCOUNTER — Other Ambulatory Visit: Payer: Self-pay | Admitting: Hematology & Oncology

## 2018-11-29 DIAGNOSIS — C50112 Malignant neoplasm of central portion of left female breast: Secondary | ICD-10-CM

## 2018-11-29 DIAGNOSIS — C73 Malignant neoplasm of thyroid gland: Secondary | ICD-10-CM

## 2019-01-17 ENCOUNTER — Inpatient Hospital Stay: Payer: Medicare Other

## 2019-01-17 ENCOUNTER — Inpatient Hospital Stay: Payer: Medicare Other | Attending: Hematology & Oncology | Admitting: Family

## 2019-01-17 ENCOUNTER — Other Ambulatory Visit: Payer: Self-pay

## 2019-01-17 VITALS — BP 123/49 | HR 70 | Resp 19 | Wt 126.0 lb

## 2019-01-17 DIAGNOSIS — R232 Flushing: Secondary | ICD-10-CM | POA: Insufficient documentation

## 2019-01-17 DIAGNOSIS — I83023 Varicose veins of left lower extremity with ulcer of ankle: Secondary | ICD-10-CM | POA: Diagnosis not present

## 2019-01-17 DIAGNOSIS — C50212 Malignant neoplasm of upper-inner quadrant of left female breast: Secondary | ICD-10-CM | POA: Diagnosis present

## 2019-01-17 DIAGNOSIS — Z79811 Long term (current) use of aromatase inhibitors: Secondary | ICD-10-CM | POA: Diagnosis not present

## 2019-01-17 DIAGNOSIS — C73 Malignant neoplasm of thyroid gland: Secondary | ICD-10-CM

## 2019-01-17 DIAGNOSIS — Z170 Estrogen receptor positive status [ER+]: Secondary | ICD-10-CM | POA: Diagnosis not present

## 2019-01-17 DIAGNOSIS — Z79899 Other long term (current) drug therapy: Secondary | ICD-10-CM

## 2019-01-17 LAB — CMP (CANCER CENTER ONLY)
ALT: 8 U/L (ref 0–44)
AST: 16 U/L (ref 15–41)
Albumin: 4 g/dL (ref 3.5–5.0)
Alkaline Phosphatase: 56 U/L (ref 38–126)
Anion gap: 6 (ref 5–15)
BUN: 26 mg/dL — ABNORMAL HIGH (ref 8–23)
CO2: 31 mmol/L (ref 22–32)
Calcium: 8.6 mg/dL — ABNORMAL LOW (ref 8.9–10.3)
Chloride: 102 mmol/L (ref 98–111)
Creatinine: 0.96 mg/dL (ref 0.44–1.00)
GFR, Est AFR Am: >60 mL/min (ref >60)
GFR, Estimated: 54 mL/min — ABNORMAL LOW (ref >60)
Glucose, Bld: 92 mg/dL (ref 70–99)
Potassium: 4.7 mmol/L (ref 3.5–5.1)
Sodium: 139 mmol/L (ref 135–145)
Total Bilirubin: 0.4 mg/dL (ref 0.3–1.2)
Total Protein: 6.6 g/dL (ref 6.5–8.1)

## 2019-01-17 LAB — CBC WITH DIFFERENTIAL (CANCER CENTER ONLY)
Abs Immature Granulocytes: 0.05 10*3/uL (ref 0.00–0.07)
Basophils Absolute: 0.1 10*3/uL (ref 0.0–0.1)
Basophils Relative: 1 %
Eosinophils Absolute: 0.1 10*3/uL (ref 0.0–0.5)
Eosinophils Relative: 2 %
HCT: 34 % — ABNORMAL LOW (ref 36.0–46.0)
Hemoglobin: 11 g/dL — ABNORMAL LOW (ref 12.0–15.0)
Immature Granulocytes: 1 %
Lymphocytes Relative: 23 %
Lymphs Abs: 1.5 10*3/uL (ref 0.7–4.0)
MCH: 31.1 pg (ref 26.0–34.0)
MCHC: 32.4 g/dL (ref 30.0–36.0)
MCV: 96 fL (ref 80.0–100.0)
Monocytes Absolute: 0.6 10*3/uL (ref 0.1–1.0)
Monocytes Relative: 9 %
Neutro Abs: 4.2 10*3/uL (ref 1.7–7.7)
Neutrophils Relative %: 64 %
Platelet Count: 165 10*3/uL (ref 150–400)
RBC: 3.54 MIL/uL — ABNORMAL LOW (ref 3.87–5.11)
RDW: 14.2 % (ref 11.5–15.5)
WBC Count: 6.5 10*3/uL (ref 4.0–10.5)
nRBC: 0 % (ref 0.0–0.2)

## 2019-01-17 NOTE — Progress Notes (Signed)
Hematology and Oncology Follow Up Visit  Kristy Olson [id number] 27-Feb-1932 85 y.o. 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 She is doing well but has a venous stasis ulcer on her left ankle that is painful. She is seeing wound care once a week for management of this.  She verbalized that she is taking her Femara as prescribed. She still has hot flashes and night sweats off and on.  Breast exam today is unchanged. No mass, lesion or rash noted.  She has had no fever, chills, n/v, cough, rash, dizziness, SOB, chest pain, palpitations, abdominal pain or change sin bowel or bladder habits.  No numbness or tingling in her extremities at this time. She occasionally will have in her toes at night.  No lymphadenopathy noted on exam.  She has maintained a good appetite and is staying well hydrated. Her weight is stable.   ECOG Performance Status: 0 - Asymptomatic  Medications:  Allergies as of 01/17/2019      Reactions   Neomy-bacit-polymyx-pramoxine Itching   Amoxicillin Hives, Other (See Comments)   Colesevelam Hcl Other (See Comments)   Doxycycline Hives, Other (See Comments)   Levaquin  [levofloxacin In D5w] Other (See Comments)   Levofloxacin Other (See Comments)   Metoprolol Other (See Comments)   Other reaction(s): unknown   Other Itching, Rash   curad triple antibiotic ointment. Curad Triple Antibiotic Ointment   Statins Hives, Other (See Comments)   Hives Other reaction(s): unknown   Colesevelam Hives   Neomycin Hives   Allergic reaction only topically. Only topically   Red Dye Hives, Itching   Red frosting caused itching and facial swelling Patient is uncertain this reaction occurred (02/21/18) currently eats foods with red dye   Tape Hives, Itching      Medication List       Accurate as of Jan 17, 2019  2:56 PM. If you have any questions, ask your nurse or doctor.        alendronate 70 MG tablet Commonly known as:  FOSAMAX TAKE 1 TABLET BY MOUTH EVERY 7 DAYS WITH A FULL GLASS OF WATER AND ON AN EMPTY STOMACH   ammonium lactate 12 % lotion Commonly known as:  AmLactin Apply apply thin film to affected once every other day   aspirin 81 MG tablet Take 81 mg by mouth daily.   cetirizine 5 MG tablet Commonly known as:  ZYRTEC Take 1 tablet (5 mg total) by mouth daily.   cholecalciferol 1000 units tablet Commonly known as:  VITAMIN D Take 1,000 Units by mouth daily. Reported on 01/05/2016   clobetasol cream 0.05 % Commonly known as:  TEMOVATE Apply 1 application topically 2 (two) times daily. Use on hot spots   Fish Oil 1000 MG Caps Take by mouth.   hydrocortisone 2.5 % lotion Apply topically 2 (two) times daily.   letrozole 2.5 MG tablet Commonly known as:  FEMARA TAKE 1 TABLET(2.5 MG) BY MOUTH DAILY   Lifitegrast 5 % Soln Commonly known as:  Xiidra Apply 1 drop to eye 2 (two) times daily.   metoprolol succinate 25 MG 24 hr tablet Commonly known as:  TOPROL-XL TAKE 1 TABLET(25 MG) BY MOUTH DAILY   predniSONE 20 MG tablet Commonly known as:  DELTASONE Take one tab by mouth once  daily for five days. Take with food.   Synthroid 150 MCG tablet Generic drug:  levothyroxine TAKE 1 TABLET(150 MCG) BY MOUTH DAILY   triamcinolone cream 0.1 % Commonly known as:  KENALOG Apply 1 application topically 2 (two) times daily.       Allergies:  Allergies  Allergen Reactions  . Neomy-Bacit-Polymyx-Pramoxine Itching  . Amoxicillin Hives and Other (See Comments)  . Colesevelam Hcl Other (See Comments)  . Doxycycline Hives and Other (See Comments)  . Levaquin  [Levofloxacin In D5w] Other (See Comments)  . Levofloxacin Other (See Comments)  . Metoprolol Other (See Comments)    Other reaction(s): unknown  . Other  Itching and Rash    curad triple antibiotic ointment. Curad Triple Antibiotic Ointment  . Statins Hives and Other (See Comments)    Hives Other reaction(s): unknown  . Colesevelam Hives  . Neomycin Hives    Allergic reaction only topically. Only topically  . Red Dye Hives and Itching    Red frosting caused itching and facial swelling  Patient is uncertain this reaction occurred (02/21/18) currently eats foods with red dye  . Tape Hives and Itching    Past Medical History, Surgical history, Social history, and Family History were reviewed and updated.  Review of Systems: All other 10 point review of systems is negative.   Physical Exam:  vitals were not taken for this visit.   Wt Readings from Last 3 Encounters:  11/21/18 122 lb (55.3 kg)  11/07/18 118 lb (53.5 kg)  07/15/18 120 lb (54.4 kg)    Ocular: Sclerae unicteric, pupils equal, round and reactive to light Ear-nose-throat: Oropharynx clear, dentition fair Lymphatic: No cervical or supraclavicular adenopathy Lungs no rales or rhonchi, good excursion bilaterally Heart regular rate and rhythm, no murmur appreciated Abd soft, nontender, positive bowel sounds, no liver or spleen tip palpated on exam, no fluid wave  MSK no focal spinal tenderness, no joint edema Neuro: non-focal, well-oriented, appropriate affect Breasts: Deferred   Lab Results  Component Value Date   WBC 4.8 07/15/2018   HGB 11.0 (L) 07/15/2018   HCT 34.8 (L) 07/15/2018   MCV 95.1 07/15/2018   PLT 164 07/15/2018   Lab Results  Component Value Date   FERRITIN 57 05/02/2018   IRON 81 05/02/2018   TIBC 367 05/02/2018   UIBC 271 04/30/2017   IRONPCTSAT 22 05/02/2018   Lab Results  Component Value Date   RBC 3.66 (L) 07/15/2018   No results found for: KPAFRELGTCHN, LAMBDASER, KAPLAMBRATIO No results found for: IGGSERUM, IGA, IGMSERUM No results found for: Odetta Pink, SPEI   Chemistry       Component Value Date/Time   NA 141 07/15/2018 1302   NA 144 04/30/2017 1134   NA 141 03/13/2017 1143   K 4.5 07/15/2018 1302   K 3.8 04/30/2017 1134   K 4.4 03/13/2017 1143   CL 104 07/15/2018 1302   CL 102 04/30/2017 1134   CO2 27 07/15/2018 1302   CO2 31 04/30/2017 1134   CO2 26 03/13/2017 1143   BUN 23 07/15/2018 1302   BUN 18 04/30/2017 1134   BUN 21.2 03/13/2017 1143   CREATININE 1.05 (H) 07/15/2018 1302   CREATININE 0.96 (H) 03/18/2018 1527   CREATININE 0.9 03/13/2017 1143      Component Value Date/Time   CALCIUM 9.1 07/15/2018 1302   CALCIUM 9.2 04/30/2017 1134   CALCIUM 9.5 03/13/2017 1143   ALKPHOS 68 07/15/2018 1302   ALKPHOS  68 04/30/2017 1134   ALKPHOS 80 03/13/2017 1143   AST 21 07/15/2018 1302   AST 21 03/13/2017 1143   ALT 9 07/15/2018 1302   ALT 19 04/30/2017 1134   ALT 13 03/13/2017 1143   BILITOT 0.4 07/15/2018 1302   BILITOT 0.50 03/13/2017 1143       Impression and Plan: Ms. Quinby is a very pleasant 85 yo caucasian female with history of stage I tubulo-lobular carcinoma of the left breast with lumpectomy in August 2017. Exam today was negative and she is doing well.  We will get her scheduled for a mammogram in the next few weeks.  We will plan to see her back in another 6 weeks.  She will contact our office with any questions or concerns. We can certainly see her sooner if be.   Laverna Peace, NP 5/29/20202:56 PM

## 2019-01-30 ENCOUNTER — Other Ambulatory Visit: Payer: Self-pay | Admitting: Hematology & Oncology

## 2019-01-30 DIAGNOSIS — C50112 Malignant neoplasm of central portion of left female breast: Secondary | ICD-10-CM

## 2019-01-30 DIAGNOSIS — C73 Malignant neoplasm of thyroid gland: Secondary | ICD-10-CM

## 2019-03-31 ENCOUNTER — Other Ambulatory Visit: Payer: Self-pay

## 2019-03-31 ENCOUNTER — Encounter: Payer: Self-pay | Admitting: Osteopathic Medicine

## 2019-03-31 ENCOUNTER — Ambulatory Visit (INDEPENDENT_AMBULATORY_CARE_PROVIDER_SITE_OTHER): Payer: Medicare Other | Admitting: Osteopathic Medicine

## 2019-03-31 VITALS — BP 122/43 | HR 56 | Temp 98.4°F | Wt 125.4 lb

## 2019-03-31 DIAGNOSIS — L2489 Irritant contact dermatitis due to other agents: Secondary | ICD-10-CM

## 2019-03-31 DIAGNOSIS — M792 Neuralgia and neuritis, unspecified: Secondary | ICD-10-CM | POA: Diagnosis not present

## 2019-03-31 MED ORDER — GABAPENTIN 100 MG PO CAPS: 100.0000 mg | ORAL_CAPSULE | Freq: Three times a day (TID) | ORAL | 0 refills | Status: DC | PRN

## 2019-03-31 MED ORDER — CLOTRIMAZOLE-BETAMETHASONE 1-0.05 % EX CREA: 1.0000 "application " | TOPICAL_CREAM | Freq: Two times a day (BID) | CUTANEOUS | 1 refills | Status: DC

## 2019-03-31 NOTE — Patient Instructions (Signed)
Would stop using the pads, these might be irritating the skin, can try switching to a hypoallergenic brand.  Would also only wear them at night, change every 8 hours even if they are not soiled.  I sent in a prescription called gabapentin to help with the pain from the ulcer.  Try taking 1 capsule as needed, if this does not help can increase to 2 or 3 at a time.

## 2019-03-31 NOTE — Progress Notes (Signed)
HPI: Kristy Olson is a 85 y.o. female who  has a past medical history of Abnormal finding on MRI of brain, Breast cancer of upper-inner quadrant of left female breast ([place]) (10/12/2015), CAD (coronary artery disease), Chronic venous insufficiency, Degeneration of lumbar or lumbosacral intervertebral disc, History of thyroid cancer, Hyperlipidemia, Hypothyroid, Idiopathic scoliosis, Osteoporosis, PNEUMONIA, COMMUNITY ACQUIRED, PNEUMOCOCCAL, and Venous stasis ulcer ([place]).  she presents to [facility] today, 03/31/19,  for chief complaint of:  Rash   She purchased some incontinence pads/underwear which she started wearing few days ago at night then during the day as well, yesterday itching rash developed in buttock area. The itching got worse with hydrocortisone, switched back to underwear.   Also reports pain on left lower extremity where she has a nonhealing ulcer, is being followed by wound care but she states they are not prescribing any medication.  Reports sharp burning pain, on occasion.   At today's visit 03/31/19 ... PMH, PSH, FH reviewed and updated as needed.  Current medication list and allergy/intolerance hx reviewed and updated as needed. (See remainder of HPI, ROS, Phys Exam below)   No results found.  No results found for this or any previous visit (from the past 72 hour(s)).        ASSESSMENT/PLAN: The primary encounter diagnosis was Irritant contact dermatitis due to other agents. A diagnosis of Nerve pain was also pertinent to this visit.   Seems like irritant more so than allergic dermatitis or infection. Treat symptomatically and monitor.   Trial gabapentin for leg pain  No orders of the defined types were placed in this encounter.    Meds ordered this encounter  Medications  . clotrimazole-betamethasone (LOTRISONE) cream    Sig: Apply 1 application topically 2 (two) times daily. For up to one week    Dispense:  15 g     Refill:  1  . gabapentin (NEURONTIN) 100 MG capsule    Sig: Take 1-3 capsules (100-300 mg total) by mouth 3 (three) times daily as needed (ulcer pain).    Dispense:  60 capsule    Refill:  0    Patient Instructions  Would stop using the pads, these might be irritating the skin, can try switching to a hypoallergenic brand.  Would also only wear them at night, change every 8 hours even if they are not soiled.  I sent in a prescription called gabapentin to help with the pain from the ulcer.  Try taking 1 capsule as needed, if this does not help can increase to 2 or 3 at a time.       Follow-up plan: Return if symptoms worsen or fail to improve.                                                 ################################################# ################################################# ################################################# #################################################    No outpatient medications have been marked as taking for the 03/31/19 encounter (Appointment) with Emeterio Reeve, DO.    Allergies  Allergen Reactions  . Neomy-Bacit-Polymyx-Pramoxine Itching  . Amoxicillin Hives and Other (See Comments)  . Colesevelam Hcl Other (See Comments)  . Doxycycline Hives and Other (See Comments)  . Levaquin  [Levofloxacin In D5w] Other (See Comments)  . Levofloxacin Other (See Comments)  . Metoprolol Other (See Comments)    Other reaction(s): unknown  . Other Itching and  Rash    curad triple antibiotic ointment. Curad Triple Antibiotic Ointment  . Statins Hives and Other (See Comments)    Hives Other reaction(s): unknown  . Colesevelam Hives  . Neomycin Hives    Allergic reaction only topically. Only topically  . Red Dye Hives and Itching    Red frosting caused itching and facial swelling  Patient is uncertain this reaction occurred (02/21/18) currently eats foods with red dye  . Tape Hives and Itching        Review of Systems:  Constitutional: No recent illness  Cardiac: No  chest pain  Respiratory:  No  shortness of breath.  Gastrointestinal: No  abdominal pain  Musculoskeletal: +new myalgia/arthralgia  Skin: +Rash  Neurologic: No  weakness, No  Dizziness  Psychiatric: No  concerns with depression, No  concerns with anxiety  Exam:  BP (!) 122/43 (BP Location: Left Arm, Patient Position: Sitting, Cuff Size: Normal)   Pulse (!) 56   Temp 98.4 F (36.9 C) (Oral)   Wt 125 lb 6.4 oz (56.9 kg)   BMI 19.64 kg/m   Constitutional: VS see above. General Appearance: alert, well-developed, well-nourished, NAD  Eyes: Normal lids and conjunctive, non-icteric sclera  Neck: No masses, trachea midline.   Respiratory: Normal respiratory effort  Musculoskeletal: Gait normal. Symmetric and independent movement of all extremities  Neurological: Normal balance/coordination. No tremor.  Skin: warm, dry, intact.  Small symmetric patches of erythema without significant edema/tenderness, non-ulcerating, no drainage, located at base of buttock bilaterally   Psychiatric: Normal judgment/insight. Normal mood and affect. Oriented x3.       Visit summary with medication list and pertinent instructions was printed for patient to review, patient was advised to alert [place] if any updates are needed. All questions at time of visit were answered - patient instructed to contact office with any additional concerns. ER/RTC precautions were reviewed with the patient and understanding verbalized.     Please note: voice recognition software was used to produce this document, and typos may escape review. Please contact Dr. Sheppard Coil for any needed clarifications.    Follow up plan: Return if symptoms worsen or fail to improve.

## 2019-04-02 ENCOUNTER — Encounter: Payer: Self-pay | Admitting: Osteopathic Medicine

## 2019-04-04 ENCOUNTER — Other Ambulatory Visit: Payer: Self-pay | Admitting: Osteopathic Medicine

## 2019-04-13 ENCOUNTER — Other Ambulatory Visit: Payer: Self-pay | Admitting: Family Medicine

## 2019-04-21 ENCOUNTER — Other Ambulatory Visit: Payer: Self-pay | Admitting: Osteopathic Medicine

## 2019-04-21 DIAGNOSIS — I2510 Atherosclerotic heart disease of native coronary artery without angina pectoris: Secondary | ICD-10-CM

## 2019-05-08 ENCOUNTER — Telehealth: Payer: Self-pay | Admitting: Osteopathic Medicine

## 2019-05-08 NOTE — Telephone Encounter (Signed)
Per Caryl Pina from Wound care at [facility], Patient stated that she had chest pain early Saturday morning. They wanted to make sure she followed up with her PCP. Please advise.

## 2019-05-08 NOTE — Telephone Encounter (Signed)
Called and spoke with Patient. She verified she did have chest pain on Saturday night. Reports it was in the center of her chest and lasted about an hour. Denied any other symptoms. Pt reports she did not call 911 because she didn't want an ambulance to come into her living community and wake the other residents.   Pt is currently not experiencing any pain or symptoms. She is in the process of getting in to see her cardiologist next week. Advised if she cannot see him, to call us back and we would get her in next week for evaluation since her symptoms have not returned again since Saturday.   Pt was advised to seek emergent care if this happens again. Verbalized understanding.

## 2019-05-08 NOTE — Telephone Encounter (Signed)
Noted and agree with triage assessment / recommendations

## 2019-05-09 ENCOUNTER — Telehealth: Payer: Self-pay | Admitting: Cardiology

## 2019-05-09 NOTE — Telephone Encounter (Signed)
Returned call to patient of Dr. Stanford Breed - last seen 12/2015. She has known CAD.  She had a bad episode of chest pain 1 week ago that resolved - was 10 of 10 pain, took her breath away. Felt like someone was squeezing her chest, very uncomfortable, would stop and start again. She reports chest discomfort in center of chest. She reports a less severe episode at 4am today.   Scheduled her for in-office visit with Lurena Joiner PA on 05/12/19 @ 3pm Stanford Breed MD is in the office this day). Provided office address as patient typically is seen in [place]. She is going out of town the end of next week and wants to be seen.   Patient advised of [id number] policy and advised if symptoms persist, worsen to seek ED eval since she has known heart disease.

## 2019-05-09 NOTE — Telephone Encounter (Signed)
New Message   Pt c/o of Chest Pain: STAT if CP now or developed within 24 hours  1. Are you having CP right now? No, patient states that the discomfort was this morning around 4am  2. Are you experiencing any other symptoms (ex. SOB, nausea, vomiting, sweating)? no  3. How long have you been experiencing CP? Second time this week  4. Is your CP continuous or coming and going? Coming and going   5. Have you taken Nitroglycerin? no ?

## 2019-05-12 ENCOUNTER — Encounter: Payer: Self-pay | Admitting: Cardiology

## 2019-05-12 ENCOUNTER — Other Ambulatory Visit: Payer: Self-pay

## 2019-05-12 ENCOUNTER — Ambulatory Visit (INDEPENDENT_AMBULATORY_CARE_PROVIDER_SITE_OTHER): Payer: Medicare Other | Admitting: General Practice

## 2019-05-12 VITALS — BP 114/60 | HR 73 | Ht 67.0 in | Wt 124.0 lb

## 2019-05-12 DIAGNOSIS — K219 Gastro-esophageal reflux disease without esophagitis: Secondary | ICD-10-CM | POA: Diagnosis not present

## 2019-05-12 DIAGNOSIS — I10 Essential (primary) hypertension: Secondary | ICD-10-CM

## 2019-05-12 DIAGNOSIS — R0789 Other chest pain: Secondary | ICD-10-CM

## 2019-05-12 DIAGNOSIS — I2510 Atherosclerotic heart disease of native coronary artery without angina pectoris: Secondary | ICD-10-CM | POA: Diagnosis not present

## 2019-05-12 MED ORDER — PANTOPRAZOLE SODIUM 40 MG PO TBEC: 40.0000 mg | DELAYED_RELEASE_TABLET | Freq: Every day | ORAL | 11 refills | Status: DC

## 2019-05-12 NOTE — Patient Instructions (Addendum)
Medication Instructions:  START PROTONIX 40MG  Take 1 tablet once a day  If you need a refill on your cardiac medications before your next appointment, please call your pharmacy.   Lab work: None  If you have labs (blood work) drawn today and your tests are completely normal, you will receive your results only by: Marland Kitchen MyChart Message (if you have MyChart) OR . A paper copy in the mail If you have any lab test that is abnormal or we need to change your treatment, we will call you to review the results.  Testing/Procedures: None   Follow-Up: At [facility], you and your health needs are our priority.  As part of our continuing mission to provide you with exceptional heart care, we have created designated Provider Care Teams.  These Care Teams include your primary Cardiologist (physician) and Advanced Practice Providers (APPs -  Physician Assistants and Nurse Practitioners) who all work together to provide you with the care you need, when you need it.  . Your physician recommends that you schedule a follow-up appointment in: 1 month with Kristy Memos, NP  Any Other Special Instructions Will Be Listed Below (If Applicable).     Food Choices for Gastroesophageal Reflux Disease, Adult When you have gastroesophageal reflux disease (GERD), the foods you eat and your eating habits are very important. Choosing the right foods can help ease your discomfort. Think about working with a nutrition specialist (dietitian) to help you make good choices. What are tips for following this plan?  Meals  Choose healthy foods that are low in fat, such as fruits, vegetables, whole grains, low-fat dairy products, and lean meat, fish, and poultry.  Eat small meals often instead of 3 large meals a day. Eat your meals slowly, and in a place where you are relaxed. Avoid bending over or lying down until 2-3 hours after eating.  Avoid eating meals 2-3 hours before bed.  Avoid drinking a lot of liquid with  meals.  Cook foods using methods other than frying. Bake, grill, or broil food instead.  Avoid or limit: ? Chocolate. ? Peppermint or spearmint. ? Alcohol. ? Pepper. ? Black and decaffeinated coffee. ? Black and decaffeinated tea. ? Bubbly (carbonated) soft drinks. ? Caffeinated energy drinks and soft drinks.  Limit high-fat foods such as: ? Fatty meat or fried foods. ? Whole milk, cream, butter, or ice cream. ? Nuts and nut butters. ? Pastries, donuts, and sweets made with butter or shortening.  Avoid foods that cause symptoms. These foods may be different for everyone. Common foods that cause symptoms include: ? Tomatoes. ? Oranges, lemons, and limes. ? Peppers. ? Spicy food. ? Onions and garlic. ? Vinegar. Lifestyle  Maintain a healthy weight. Ask your doctor what weight is healthy for you. If you need to lose weight, work with your doctor to do so safely.  Exercise for at least 30 minutes for 5 or more days each week, or as told by your doctor.  Wear loose-fitting clothes.  Do not smoke. If you need help quitting, ask your doctor.  Sleep with the head of your bed higher than your feet. Use a wedge under the mattress or blocks under the bed frame to raise the head of the bed. Summary  When you have gastroesophageal reflux disease (GERD), food and lifestyle choices are very important in easing your symptoms.  Eat small meals often instead of 3 large meals a day. Eat your meals slowly, and in a place where you are relaxed.  Limit high-fat foods such as fatty meat or fried foods.  Avoid bending over or lying down until 2-3 hours after eating.  Avoid peppermint and spearmint, caffeine, alcohol, and chocolate. This information is not intended to replace advice given to you by your health care provider. Make sure you discuss any questions you have with your health care provider. Document Released: 02/06/2012 Document Revised: 11/28/2018 Document Reviewed: 09/12/2016  Elsevier Patient Education  2020 [employer].

## 2019-05-12 NOTE — Progress Notes (Signed)
Cardiology Clinic Note   Patient Name: Kristy Olson Date of Encounter: 05/12/2019  Primary Care Provider:  Emeterio Reeve, DO Primary Cardiologist:  Kirk Ruths, MD  Patient Profile    Kristy Olson 85 year old female presents today for follow-up of her coronary artery disease and essential hypertension.  Past Medical History    Past Medical History:  Diagnosis Date  . Abnormal finding on MRI of brain   . Breast cancer of upper-inner quadrant of left female breast ([place]) 10/12/2015  . CAD (coronary artery disease)   . Chronic venous insufficiency    Compression stockings, Dr. Carmine Savoy to evaluate late May 2015   . Degeneration of lumbar or lumbosacral intervertebral disc   . History of thyroid cancer    With reported brain mets.   . Hyperlipidemia   . Hypothyroid    Outside records report synthroid 174mcg despite her report of 12mcg. 02/2014 awaiting clarification   . Idiopathic scoliosis   . Osteoporosis    July 2014 DEXA   . PNEUMONIA, COMMUNITY ACQUIRED, PNEUMOCOCCAL   . Venous stasis ulcer ([place])    Past Surgical History:  Procedure Laterality Date  . BREAST LUMPECTOMY WITH RADIOACTIVE SEED LOCALIZATION Left 03/29/2016   Procedure: LEFT BREAST LUMPECTOMY WITH RADIOACTIVE SEED LOCALIZATION;  Surgeon: Autumn Messing III, MD;  Location: [facility];  Service: General;  Laterality: Left;  LEFT BREAST LUMPECTOMY WITH RADIOACTIVE SEED LOCALIZATION  . cranotomy    . THYROIDECTOMY      Allergies  Allergies  Allergen Reactions  . Neomy-Bacit-Polymyx-Pramoxine Itching  . Amoxicillin Hives and Other (See Comments)  . Colesevelam Hcl Other (See Comments)  . Doxycycline Hives and Other (See Comments)  . Levaquin  [Levofloxacin In D5w] Other (See Comments)  . Levofloxacin Other (See Comments)  . Metoprolol Other (See Comments)    Other reaction(s): unknown  . Other Itching and Rash    curad triple antibiotic ointment. Curad Triple Antibiotic Ointment   . Statins Hives and Other (See Comments)    Hives Other reaction(s): unknown  . Colesevelam Hives  . Neomycin Hives    Allergic reaction only topically. Only topically  . Red Dye Hives and Itching    Red frosting caused itching and facial swelling  Patient is uncertain this reaction occurred (02/21/18) currently eats foods with red dye  . Tape Hives and Itching    History of Present Illness    Ms. Start was last seen by Dr. Stanford Breed on 04/06/2016.  At that time she was noticing increased dyspnea with more extreme activities however not with routine physical activity.  The dyspnea was relieved with rest and not associated with chest pain.  She did not complain of orthopnea, PND or lower extremity edema.  She is not having syncope or palpitations.  She also stated that she had no exertional chest pain.  However, she stated that she had been having nonexertional chest discomfort on occasion that would last less than 15 minutes.  She noted that this is been happening for years and had gone unchanged.  She denied radiation or other associated symptoms, her occasional chest discomfort which spontaneously resolved without intervention.  Her PMH also includes hypertension, chronic venous insufficiency, coronary artery disease (previous stents placed in [place], [place] no records available), hypothyroidism, osteoporosis, elevated triglycerides with high cholesterol, decreased TSH, and melanoma.  She presents the clinic today and states she has had 3 episodes of chest pressure which she describes as feeling like "swallowing a large amount of peanut butter without  liquid" over the last 2 to 3 months.  She states that the episodes happen in the evening while she is at rest and resolves spontaneously.  She states she has had one episode in the morning around 5 AM.  She goes on to state that she has noticed the symptoms for several years on and off.  Her symptoms appear to be epigastric in nature.  She  states she enjoys spicy food, tomato-based foods, and caffeinated beverages.  She also remembers eating chef salads for dinner on 2 separate occasions and having chest pressure with each occasion.  She denies any discomfort with exertion.  She denies chest pain, shortness of breath, lower extremity edema, fatigue, palpitations, melena, hematuria, hemoptysis, diaphoresis, weakness, presyncope, syncope, orthopnea, and PND.   Home Medications    Prior to Admission medications   Medication Sig Start Date End Date Taking? Authorizing Provider  alendronate (FOSAMAX) 70 MG tablet TAKE 1 TABLET BY MOUTH EVERY 7 DAYS WITH A FULL GLASS OF WATER AND ON AN EMPTY STOMACH 02/21/16   Renato Shin, MD  ammonium lactate (AMLACTIN) 12 % lotion Apply apply thin film to affected once every other day 11/21/18 11/21/19  Kandra Nicolas, MD  aspirin 81 MG tablet Take 81 mg by mouth daily.    [provider]  cetirizine (ZYRTEC) 5 MG tablet Take 1 tablet (5 mg total) by mouth daily. 11/02/18   Kandra Nicolas, MD  cholecalciferol (VITAMIN D) 1000 units tablet Take 1,000 Units by mouth daily. Reported on 01/05/2016    [provider]  clobetasol cream (TEMOVATE) [id number] % Apply 1 application topically 2 (two) times daily. Use on hot spots 11/07/18   Gregor Hams, MD  clotrimazole-betamethasone (LOTRISONE) cream Apply 1 application topically 2 (two) times daily. For up to one week 03/31/19   Emeterio Reeve, DO  gabapentin (NEURONTIN) 100 MG capsule TAKE 1 TO 3 CAPSULES BY MOUTH THREE TIMES DAILY AS NEEDED FOR ULCER PAIN 04/14/19   Emeterio Reeve, DO  hydrocortisone 2.5 % lotion Apply topically 2 (two) times daily. 11/02/18   Kandra Nicolas, MD  letrozole [facility]) 2.5 MG tablet TAKE 1 TABLET(2.5 MG) BY MOUTH DAILY 01/30/19   Volanda Napoleon, MD  Lifitegrast [facility]) 5 % SOLN Apply 1 drop to eye 2 (two) times daily. 06/26/16   Emeterio Reeve, DO  metoprolol succinate (TOPROL-XL) 25 MG 24 hr tablet TAKE 1  TABLET(25 MG) BY MOUTH DAILY 04/21/19   Emeterio Reeve, DO  Omega-3 Fatty Acids (FISH OIL) 1000 MG CAPS Take by mouth.    [provider]  predniSONE (DELTASONE) 20 MG tablet Take one tab by mouth once daily for five days. Take with food. 11/02/18   Kandra Nicolas, MD  SYNTHROID 150 MCG tablet TAKE 1 TABLET(150 MCG) BY MOUTH DAILY 06/17/18   Renato Shin, MD  triamcinolone cream (KENALOG) 0.1 % Apply 1 application topically 2 (two) times daily. 11/07/18   Gregor Hams, MD    Family History    Family History  Problem Relation Age of Onset  . CAD Sister   . Cancer Sister   . Heart disease Sister   . Diabetes Father   . COPD Father    She indicated that her mother is deceased. She indicated that her father is deceased. She indicated that her sister is deceased.  Social History    Social History   Socioeconomic History  . Marital status: Widowed    Spouse name: Not on file  .  Number of children: 2  . Years of education: Not on file  . Highest education level: Not on file  Occupational History    Employer: RETIRED  Social Needs  . Financial resource strain: Not on file  . Food insecurity    Worry: Not on file    Inability: Not on file  . Transportation needs    Medical: Not on file    Non-medical: Not on file  Tobacco Use  . Smoking status: Never Smoker  . Smokeless tobacco: Never Used  Substance and Sexual Activity  . Alcohol use: Yes    Alcohol/week: 0.0 standard drinks    Comment: Occasional  . Drug use: No  . Sexual activity: Not on file  Lifestyle  . Physical activity    Days per week: Not on file    Minutes per session: Not on file  . Stress: Not on file  Relationships  . Social Herbalist on phone: Not on file    Gets together: Not on file    Attends religious service: Not on file    Active member of club or organization: Not on file    Attends meetings of clubs or organizations: Not on file    Relationship status: Not on file  .  Intimate partner violence    Fear of current or ex partner: Not on file    Emotionally abused: Not on file    Physically abused: Not on file    Forced sexual activity: Not on file  Other Topics Concern  . Not on file  Social History Narrative  . Not on file     Review of Systems    General:  No chills, fever, night sweats or weight changes.  Cardiovascular:  No chest pain, dyspnea on exertion, edema, orthopnea, palpitations, paroxysmal nocturnal dyspnea. Dermatological: No rash, lesions/masses Respiratory: No cough, dyspnea Urologic: No hematuria, dysuria Abdominal:   No nausea, vomiting, diarrhea, bright red blood per rectum, melena, or hematemesis Neurologic:  No visual changes, wkns, changes in mental status. All other systems reviewed and are otherwise negative except as noted above.  Physical Exam    VS:  BP 114/60   Pulse 73   Ht 5\' 7"  (1.702 m)   Wt 124 lb (56.2 kg)   BMI 19.42 kg/m  , BMI Body mass index is 19.42 kg/m. GEN: Well nourished, well developed, in no acute distress. HEENT: normal. Neck: Supple, no JVD, carotid bruits, or masses. Cardiac: RRR, no murmurs, rubs, or gallops. No clubbing, cyanosis, edema.  Radials/DP/PT 2+ and equal bilaterally.  Respiratory:  Respirations regular and unlabored, clear to auscultation bilaterally. GI: Soft, nontender, nondistended, BS + x 4. MS: no deformity or atrophy. Skin: warm and dry, no rash. Neuro:  Strength and sensation are intact. Psych: Normal affect.  Accessory Clinical Findings    ECG personally reviewed by me today-normal sinus rhythm septal infarct age undetermined 73 bpm- No acute changes  EKG 01/05/2016 Normal sinus rhythm 82 bpm   Stress test 12/29/2014 Ejection fraction 62%, no ischemia or infarction  Assessment & Plan   GERD- she describes having episodes of pressure in the evening after evening meals at rest.  States this is been ongoing for several years and has noticed 2-3 episodes over the  last couple months.  Each episode resolved spontaneously without medication. Start Protonix 40 mg tablet daily Follow diet instructions  Chest pressure- continues to have intermittent periods of occasional chest pressure which is remained unchanged for several  years.  She describes the pressure as happening in the evening after dinner and occasionally in the early hours of the morning.  EKG shows normal sinus rhythm septal infarct undetermined age 40 bpm ( Remote stenting in [place], [place] unable to obtain records) Her last stress test showed no ischemia or infarct (12/2014). Continue 81 mg aspirin daily Heart healthy low-sodium diet Increase physical activity as tolerated  Essential hypertension-BP today 114/60 Continue metoprolol succinate 25 mg tablet daily Heart healthy low-sodium diet Increase physical activity as tolerated  Hyperlipidemia-LDL 162 08/03/2017 Statin intolerant Continue omega-3 fatty acids 1000 mg tablet daily Heart healthy low-sodium diet Increase physical activity as tolerated Lipid clinic-for   Disposition: Follow-up with APP in 1 month.  Deberah Pelton, NP-C 05/12/2019, 3:05 PM

## 2019-05-30 ENCOUNTER — Telehealth: Payer: Self-pay

## 2019-05-30 NOTE — Telephone Encounter (Signed)
I could not say for sure without seeing it myself, I feel a bit more comfortable knowing that a physician has at least seen it.  Can we just call the patient to check up on her and see if she has any questions or concerns?

## 2019-05-30 NOTE — Telephone Encounter (Signed)
Dr. Briscoe Deutscher. Kristy Dales, MD Address: [address] [place], [place] [address] Phone: [phone] called and states [name] walked into his clinic yesterday with a wound on the right side of her face from the eye brow to her cheek. He did put her on Keflex 250 mg QID. Her eye is not effected by the wound. He was wanting her to follow up today with Dr Sheppard Coil or at least a follow up call.    I called and left a message for patient to return call. I will schedule her for a follow up on Monday if appropriate.

## 2019-05-30 NOTE — Telephone Encounter (Signed)
Pt scheduled for first opening with PCP (Tuesday 06/03/19). Will route to PCP to see if she feels Pt should be worked in sooner or go to UC. She is on antibiotics at this time.

## 2019-05-30 NOTE — Telephone Encounter (Signed)
Dr Clide Dales is an ophthalmologist Would agree patient needs seen by me or in urgent care if needed

## 2019-06-02 ENCOUNTER — Telehealth: Payer: Self-pay | Admitting: General Practice

## 2019-06-02 NOTE — Telephone Encounter (Signed)
Called pt regarding concerns. Pt stated that she is not going to continue the heart healthy, low sodium diet because she can not eat anything. She stated that she realizes that when she has chest pain it is not her heart but she says she doesn't think it's the food either. I asked if the pt was taking her protonix. She denied. The pt was educated that she was prescribed this medication because it should help control her GERD. She continued to deny taking it. This pt was incompliant with education to GERD relating to chest pain and protonix. She repeatedly said she was not going to follow the diet and thinks her chest pain is caused by something else. She stated that she lives in [place] in [place] and she does not prepare her food so she can not control what she eats. This nurse offered to call [facility] to see if they can provide her a Heart Healthy diet, the pt refused. This pt was not compliant with recommendations or education at all this conversation. The pt was told that her concerns will be routed to the care team and if she continues to have chest pain that does not reside in 30 mins to call 911.

## 2019-06-02 NOTE — Telephone Encounter (Signed)
Left message for a return call

## 2019-06-02 NOTE — Telephone Encounter (Signed)
Agree with plan Brian Crenshaw  

## 2019-06-02 NOTE — Telephone Encounter (Signed)
New message   Patient has questions about the diet that she is on for acid reflux. Please call to discuss.

## 2019-06-03 ENCOUNTER — Other Ambulatory Visit: Payer: Self-pay

## 2019-06-03 ENCOUNTER — Encounter: Payer: Self-pay | Admitting: Osteopathic Medicine

## 2019-06-03 ENCOUNTER — Ambulatory Visit (INDEPENDENT_AMBULATORY_CARE_PROVIDER_SITE_OTHER): Payer: Medicare Other | Admitting: Osteopathic Medicine

## 2019-06-03 VITALS — BP 111/67 | HR 66 | Temp 98.2°F | Wt 121.1 lb

## 2019-06-03 DIAGNOSIS — L989 Disorder of the skin and subcutaneous tissue, unspecified: Secondary | ICD-10-CM

## 2019-06-03 DIAGNOSIS — R0789 Other chest pain: Secondary | ICD-10-CM | POA: Diagnosis not present

## 2019-06-03 DIAGNOSIS — Z23 Encounter for immunization: Secondary | ICD-10-CM | POA: Diagnosis not present

## 2019-06-03 DIAGNOSIS — N3941 Urge incontinence: Secondary | ICD-10-CM

## 2019-06-03 NOTE — Patient Instructions (Signed)
Plan:  Can try CeraVe or Aveeno daily to help keep the skin moisturized and help with itching.  OK to use the hydrocortisone just under the eyebrow, avoid getting this directly into the eyeball.  Can continue what you are doing as far as the incontinence pads, just keep them changed out when wet, try to keep the skin as dry as you can.  If you want to try a medication for incontinence, please let me know!  If heart is checking out fine, that is good news.  If this chest discomfort continues, I would start the antacid that you were prescribed and see if this helps, would give it a couple of weeks on the medicine.  Can just add it to your usual medications that you are taking.

## 2019-06-03 NOTE — Progress Notes (Signed)
HPI: Kristy Olson is a 85 y.o. female who  has a past medical history of Abnormal finding on MRI of brain, Breast cancer of upper-inner quadrant of left female breast ([place]) (10/12/2015), CAD (coronary artery disease), Chronic venous insufficiency, Degeneration of lumbar or lumbosacral intervertebral disc, History of thyroid cancer, Hyperlipidemia, Hypothyroid, Idiopathic scoliosis, Osteoporosis, PNEUMONIA, COMMUNITY ACQUIRED, PNEUMOCOCCAL, and Venous stasis ulcer ([place]).  she presents to [facility] today, 06/03/19,  for chief complaint of:  Skin problem Chest pain incontinence  Last week she had a bump on lid or R eye and she peeled this off, face was getting red and itchy over the nextfew days, feeling, swollen, warm to the touch. She went into her ophtho office 05/29/19 and was Rx keflex. They called [place] as FYI. No other records available.  Patient reports that she has been using some kind of cream on the area which has been helping, rash was apparently a bit more widespread over the cheek, now is just bothering her right under the eyebrow  Mentions some chest discomfort she had a few weeks ago, she followed up with her cardiology office and they had some concerns for possible GERD, patient reports previous negative stress test, she is convinced that this is nothing to do with acid reflux.  She describes a epigastric/substernal chest chest discomfort that "feels like hunger of peanut butter or something like I need to drink something to get it down", feels like a tightness, was worse while lying down at night, has had similar episodes before may be a couple of times per year.  Exertional chest pain.  Mentions issues with incontinence, states that she has been getting up in the middle of the night and having trouble making it to the bathroom.  Occasionally will have episodes like this in the afternoon as well.  Has been wearing incontinence pads and changing these  out anytime that she has an accident.  She does not want to take any medications for this.     At today's visit 06/03/19 ... PMH, PSH, FH reviewed and updated as needed.  Current medication list and allergy/intolerance hx reviewed and updated as needed. (See remainder of HPI, ROS, Phys Exam below)   No results found.  No results found for this or any previous visit (from the past 72 hour(s)).        ASSESSMENT/PLAN: The primary encounter diagnosis was Skin problem. Diagnoses of Need for influenza vaccination, Atypical chest pain, and Urge incontinence were also pertinent to this visit.   Skin issue is fairly unimpressive, I do not have a strong suspicion for shingles or any kind of infection at this point, recommend that she finish out her antibiotics, still having some itching and some lesions above the eye as noted in the photograph.  Patient has a dermatologist, would recommend that she follow-up with them if this persists, can apply hydrocortisone to the area under the eyebrow, try to keep this away from the eye/lid  Recommended trial of PPI as directed by cardiology, or can try taking Tums or Pepto-Bismol as needed for this chest pain and see if that helps.  Does not sound cardiac in nature to me, and she has been cleared by cardiology.  Patient is reluctant to accept diagnosis of anything to do with acid reflux.  But may also consider referral to GI, questionable esophageal spasm or something like that.  Incontinence, no changes, option to continue what she is doing and possibly add medication, patient  states she does not want to take medication for this.  Orders Placed This Encounter  Procedures  . Flu Vaccine QUAD High Dose(Fluad)     No orders of the defined types were placed in this encounter.   Patient Instructions  Plan:  Can try CeraVe or Aveeno daily to help keep the skin moisturized and help with itching.  OK to use the hydrocortisone just under the eyebrow,  avoid getting this directly into the eyeball.  Can continue what you are doing as far as the incontinence pads, just keep them changed out when wet, try to keep the skin as dry as you can.  If you want to try a medication for incontinence, please let me know!  If heart is checking out fine, that is good news.  If this chest discomfort continues, I would start the antacid that you were prescribed and see if this helps, would give it a couple of weeks on the medicine.  Can just add it to your usual medications that you are taking.          Follow-up plan: Return for recheck itching and incontinence if needed .                                                 ################################################# ################################################# ################################################# #################################################    Current Meds  Medication Sig  . aspirin 81 MG tablet Take 81 mg by mouth daily.  . cephALEXin (KEFLEX) 250 MG capsule   . gabapentin (NEURONTIN) 100 MG capsule TAKE 1 TO 3 CAPSULES BY MOUTH THREE TIMES DAILY AS NEEDED FOR ULCER PAIN  . letrozole (FEMARA) 2.5 MG tablet TAKE 1 TABLET(2.5 MG) BY MOUTH DAILY  . metoprolol succinate (TOPROL-XL) 25 MG 24 hr tablet TAKE 1 TABLET(25 MG) BY MOUTH DAILY  . Omega-3 Fatty Acids (FISH OIL) 1000 MG CAPS Take by mouth.  . pantoprazole (PROTONIX) 40 MG tablet Take 1 tablet (40 mg total) by mouth daily.  Marland Kitchen SYNTHROID 150 MCG tablet TAKE 1 TABLET(150 MCG) BY MOUTH DAILY    Allergies  Allergen Reactions  . Neomy-Bacit-Polymyx-Pramoxine Itching  . Amoxicillin Hives and Other (See Comments)  . Colesevelam Hcl Other (See Comments)  . Doxycycline Hives and Other (See Comments)  . Levaquin  [Levofloxacin In D5w] Other (See Comments)  . Levofloxacin Other (See Comments)  . Metoprolol Other (See Comments)    Other reaction(s): unknown  . Other Itching  and Rash    curad triple antibiotic ointment. Curad Triple Antibiotic Ointment  . Statins Hives and Other (See Comments)    Hives Other reaction(s): unknown  . Colesevelam Hives  . Neomycin Hives    Allergic reaction only topically. Only topically  . Red Dye Hives and Itching    Red frosting caused itching and facial swelling  Patient is uncertain this reaction occurred (02/21/18) currently eats foods with red dye  . Tape Hives and Itching       Review of Systems:  Constitutional: No recent illness  Cardiac: No  chest pain, +pressure, No palpitations  Respiratory:  No  shortness of breath. No  Cough  Gastrointestinal: No  abdominal pain  Musculoskeletal: No new myalgia/arthralgia  Skin: +Rash  Hem/Onc: No  easy bruising/bleeding, No  abnormal lumps/bumps  Neurologic: No  weakness, No  Dizziness  Psychiatric: No  concerns with depression, No  concerns with anxiety  Exam:  BP 111/67 (BP Location: Left Arm, Patient Position: Sitting, Cuff Size: Normal)   Pulse 66   Temp 98.2 F (36.8 C) (Oral)   Wt 121 lb 1.9 oz (54.9 kg)   BMI 18.97 kg/m   Constitutional: VS see above. General Appearance: alert, well-developed, well-nourished, NAD  Eyes: Normal lids and conjunctive, non-icteric sclera  Ears, Nose, Mouth, Throat: MMM, Normal external inspection ears/nares/mouth/lips/gums.  Neck: No masses, trachea midline.   Respiratory: Normal respiratory effort. no wheeze, no rhonchi, no rales  Cardiovascular: S1/S2 normal, no murmur, no rub/gallop auscultated. RRR.   Musculoskeletal: Gait normal. Symmetric and independent movement of all extremities  Neurological: Normal balance/coordination. No tremor.  Skin: warm, dry, intact. See photo  Psychiatric: Normal judgment/insight. Normal mood and affect. Oriented x3.         Visit summary with medication list and pertinent instructions was printed for patient to review, patient was advised to alert [place] if any  updates are needed. All questions at time of visit were answered - patient instructed to contact office with any additional concerns. ER/RTC precautions were reviewed with the patient and understanding verbalized.   Note: Total time spent 40 minutes, greater than 50% of the visit was spent face-to-face counseling and coordinating care for the following: The primary encounter diagnosis was Skin problem. Diagnoses of Need for influenza vaccination, Atypical chest pain, and Urge incontinence were also pertinent to this visit.Marland Kitchen  Please note: voice recognition software was used to produce this document, and typos may escape review. Please contact Dr. Sheppard Coil for any needed clarifications.    Follow up plan: Return for recheck itching and incontinence if needed .

## 2019-06-05 ENCOUNTER — Telehealth: Payer: Self-pay | Admitting: Neurology

## 2019-06-05 DIAGNOSIS — R0789 Other chest pain: Secondary | ICD-10-CM

## 2019-06-05 NOTE — Telephone Encounter (Signed)
Patient called wondering if she needs to follow up with Cardiology. Per last office note it states she is cleared by Cardiology and it was recommended she maybe see GI to r/o esophageal spasm vs GERD or something else causing atypical chest pain. She is agreeable to GI referral. Referral pended. Please sign if appropriate.

## 2019-06-10 NOTE — Telephone Encounter (Signed)
Patient had a visit.

## 2019-06-16 ENCOUNTER — Ambulatory Visit: Payer: Medicare Other | Admitting: Adult Health

## 2019-07-21 ENCOUNTER — Other Ambulatory Visit: Payer: Self-pay

## 2019-07-21 ENCOUNTER — Inpatient Hospital Stay (HOSPITAL_BASED_OUTPATIENT_CLINIC_OR_DEPARTMENT_OTHER): Payer: Medicare Other | Admitting: Family

## 2019-07-21 ENCOUNTER — Encounter: Payer: Self-pay | Admitting: Family

## 2019-07-21 ENCOUNTER — Inpatient Hospital Stay: Payer: Medicare Other | Attending: Family

## 2019-07-21 VITALS — BP 105/78 | HR 60 | Temp 97.6°F | Resp 17 | Wt 123.1 lb

## 2019-07-21 DIAGNOSIS — Z79811 Long term (current) use of aromatase inhibitors: Secondary | ICD-10-CM | POA: Insufficient documentation

## 2019-07-21 DIAGNOSIS — R232 Flushing: Secondary | ICD-10-CM | POA: Insufficient documentation

## 2019-07-21 DIAGNOSIS — C50212 Malignant neoplasm of upper-inner quadrant of left female breast: Secondary | ICD-10-CM | POA: Diagnosis not present

## 2019-07-21 DIAGNOSIS — C50912 Malignant neoplasm of unspecified site of left female breast: Secondary | ICD-10-CM | POA: Insufficient documentation

## 2019-07-21 DIAGNOSIS — L97329 Non-pressure chronic ulcer of left ankle with unspecified severity: Secondary | ICD-10-CM | POA: Diagnosis not present

## 2019-07-21 DIAGNOSIS — Z170 Estrogen receptor positive status [ER+]: Secondary | ICD-10-CM | POA: Insufficient documentation

## 2019-07-21 DIAGNOSIS — Z7982 Long term (current) use of aspirin: Secondary | ICD-10-CM | POA: Diagnosis not present

## 2019-07-21 DIAGNOSIS — Z79899 Other long term (current) drug therapy: Secondary | ICD-10-CM | POA: Diagnosis not present

## 2019-07-21 LAB — CBC WITH DIFFERENTIAL (CANCER CENTER ONLY)
Abs Immature Granulocytes: 0.01 K/uL (ref 0.00–0.07)
Basophils Absolute: 0 K/uL (ref 0.0–0.1)
Basophils Relative: 1 %
Eosinophils Absolute: 0.2 K/uL (ref 0.0–0.5)
Eosinophils Relative: 3 %
HCT: 34.4 % — ABNORMAL LOW (ref 36.0–46.0)
Hemoglobin: 11.4 g/dL — ABNORMAL LOW (ref 12.0–15.0)
Immature Granulocytes: 0 %
Lymphocytes Relative: 27 %
Lymphs Abs: 1.5 K/uL (ref 0.7–4.0)
MCH: 31 pg (ref 26.0–34.0)
MCHC: 33.1 g/dL (ref 30.0–36.0)
MCV: 93.5 fL (ref 80.0–100.0)
Monocytes Absolute: 0.5 K/uL (ref 0.1–1.0)
Monocytes Relative: 8 %
Neutro Abs: 3.4 K/uL (ref 1.7–7.7)
Neutrophils Relative %: 61 %
Platelet Count: 168 K/uL (ref 150–400)
RBC: 3.68 MIL/uL — ABNORMAL LOW (ref 3.87–5.11)
RDW: 13.4 % (ref 11.5–15.5)
WBC Count: 5.6 K/uL (ref 4.0–10.5)
nRBC: 0 % (ref 0.0–0.2)

## 2019-07-21 LAB — CMP (CANCER CENTER ONLY)
ALT: 10 U/L (ref 0–44)
AST: 17 U/L (ref 15–41)
Albumin: 4.3 g/dL (ref 3.5–5.0)
Alkaline Phosphatase: 66 U/L (ref 38–126)
Anion gap: 7 (ref 5–15)
BUN: 26 mg/dL — ABNORMAL HIGH (ref 8–23)
CO2: 29 mmol/L (ref 22–32)
Calcium: 8.8 mg/dL — ABNORMAL LOW (ref 8.9–10.3)
Chloride: 103 mmol/L (ref 98–111)
Creatinine: 1.14 mg/dL — ABNORMAL HIGH (ref 0.44–1.00)
GFR, Est AFR Am: 51 mL/min — ABNORMAL LOW
GFR, Estimated: 44 mL/min — ABNORMAL LOW
Glucose, Bld: 83 mg/dL (ref 70–99)
Potassium: 4.3 mmol/L (ref 3.5–5.1)
Sodium: 139 mmol/L (ref 135–145)
Total Bilirubin: 0.4 mg/dL (ref 0.3–1.2)
Total Protein: 6.7 g/dL (ref 6.5–8.1)

## 2019-07-21 NOTE — Progress Notes (Signed)
Hematology and Oncology Follow Up Visit  Kristy Olson [id number] [birth year] 85 y.o. 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 is here today for follow-up. She is doing quite well but still has the healing ulcer on her left ankle. Wound care has been working diligently with her cleaning and wrapping her leg and she states that it has almost closed back up.  Breast exam today was negative.  She continues to do well on Femara daily but notes occasional hot flashes.  She has had no issues with infection. No fever, chills, n/v, cough, rash, dizziness, SOB, chest pain, palpitations, abdominal pain or changes in bowel or bladder habits.  No numbness or tingling in her extremities.  No falls or syncope.  She has maintained a good appetite and is staying well hydrated. Her weight is stable.   ECOG Performance Status: 1 - Symptomatic but completely ambulatory  Medications:  Allergies as of 07/21/2019      Reactions   Neomy-bacit-polymyx-pramoxine Itching   Amoxicillin Hives, Other (See Comments)   Colesevelam Hcl Other (See Comments)   Doxycycline Hives, Other (See Comments)   Levaquin  [levofloxacin In D5w] Other (See Comments)   Levofloxacin Other (See Comments)   Metoprolol Other (See Comments)   Other reaction(s): unknown   Other Itching, Rash   curad triple antibiotic ointment. Curad Triple Antibiotic Ointment   Statins Hives, Other (See Comments)   Hives Other reaction(s): unknown   Colesevelam Hives   Neomycin Hives   Allergic reaction only topically. Only topically   Red Dye Hives, Itching   Red frosting caused itching and facial swelling Patient is uncertain this reaction occurred (02/21/18) currently eats foods with red dye   Tape Hives, Itching      Medication List       Accurate as of July 21, 2019  3:28 PM. If you have any questions, ask your nurse or doctor.        aspirin 81 MG tablet Take 81 mg by mouth daily.   cephALEXin 250 MG capsule Commonly known as: KEFLEX   Fish Oil 1000 MG Caps Take by mouth.   gabapentin 100 MG capsule Commonly known as: NEURONTIN TAKE 1 TO 3 CAPSULES BY MOUTH THREE TIMES DAILY AS NEEDED FOR ULCER PAIN   letrozole 2.5 MG tablet Commonly known as: FEMARA TAKE 1 TABLET(2.5 MG) BY MOUTH DAILY   metoprolol succinate 25 MG 24 hr tablet Commonly known as: TOPROL-XL TAKE 1 TABLET(25 MG) BY MOUTH DAILY   pantoprazole 40 MG tablet Commonly known as: PROTONIX Take 1 tablet (40 mg total) by mouth daily.   Synthroid 150 MCG tablet Generic drug: levothyroxine TAKE 1 TABLET(150 MCG) BY MOUTH DAILY       Allergies:  Allergies  Allergen Reactions  . Neomy-Bacit-Polymyx-Pramoxine Itching  . Amoxicillin Hives and Other (See Comments)  . Colesevelam Hcl Other (See Comments)  . Doxycycline Hives and Other (See Comments)  . Levaquin  [Levofloxacin In D5w] Other (See Comments)  . Levofloxacin Other (See Comments)  . Metoprolol Other (See Comments)    Other reaction(s): unknown  . Other Itching and Rash    curad triple antibiotic ointment. Curad Triple Antibiotic Ointment  . Statins Hives and Other (See Comments)    Hives Other reaction(s): unknown  . Colesevelam Hives  . Neomycin Hives  Allergic reaction only topically. Only topically  . Red Dye Hives and Itching    Red frosting caused itching and facial swelling  Patient is uncertain this reaction occurred (02/21/18) currently eats foods with red dye  . Tape Hives and Itching    Past Medical History, Surgical history, Social history, and Family History were reviewed and updated.  Review of Systems: All other 10 point review of systems is negative.   Physical Exam:  weight is 123 lb 1.9 oz (55.8 kg). Her temporal temperature is 97.6 F (36.4 C).  Her blood pressure is 105/78 and her pulse is 60. Her respiration is 17 and oxygen saturation is 100%.   Wt Readings from Last 3 Encounters:  07/21/19 123 lb 1.9 oz (55.8 kg)  06/03/19 121 lb 1.9 oz (54.9 kg)  05/12/19 124 lb (56.2 kg)    Ocular: Sclerae unicteric, pupils equal, round and reactive to light Ear-nose-throat: Oropharynx clear, dentition fair Lymphatic: No cervical or supraclavicular adenopathy Lungs no rales or rhonchi, good excursion bilaterally Heart regular rate and rhythm, no murmur appreciated Abd soft, nontender, positive bowel sounds, no liver or spleen tip palpated on exam, no fluid wave  MSK no focal spinal tenderness, no joint edema Neuro: non-focal, well-oriented, appropriate affect Breasts: No changes on today's bilateral breast exam, no mass. Lesion or rash noted  Lab Results  Component Value Date   WBC 5.6 07/21/2019   HGB 11.4 (L) 07/21/2019   HCT 34.4 (L) 07/21/2019   MCV 93.5 07/21/2019   PLT 168 07/21/2019   Lab Results  Component Value Date   FERRITIN 57 05/02/2018   IRON 81 05/02/2018   TIBC 367 05/02/2018   UIBC 271 04/30/2017   IRONPCTSAT 22 05/02/2018   Lab Results  Component Value Date   RBC 3.68 (L) 07/21/2019   No results found for: KPAFRELGTCHN, LAMBDASER, KAPLAMBRATIO No results found for: Kandis Cocking, IGMSERUM No results found for: Odetta Pink, SPEI   Chemistry      Component Value Date/Time   NA 139 07/21/2019 1448   NA 144 04/30/2017 1134   NA 141 03/13/2017 1143   K 4.3 07/21/2019 1448   K 3.8 04/30/2017 1134   K 4.4 03/13/2017 1143   CL 103 07/21/2019 1448   CL 102 04/30/2017 1134   CO2 29 07/21/2019 1448   CO2 31 04/30/2017 1134   CO2 26 03/13/2017 1143   BUN 26 (H) 07/21/2019 1448   BUN 18 04/30/2017 1134   BUN 21.2 03/13/2017 1143   CREATININE 1.14 (H) 07/21/2019 1448   CREATININE 0.96 (H) 03/18/2018 1527   CREATININE 0.9 03/13/2017 1143       Component Value Date/Time   CALCIUM 8.8 (L) 07/21/2019 1448   CALCIUM 9.2 04/30/2017 1134   CALCIUM 9.5 03/13/2017 1143   ALKPHOS 66 07/21/2019 1448   ALKPHOS 68 04/30/2017 1134   ALKPHOS 80 03/13/2017 1143   AST 17 07/21/2019 1448   AST 21 03/13/2017 1143   ALT 10 07/21/2019 1448   ALT 19 04/30/2017 1134   ALT 13 03/13/2017 1143   BILITOT 0.4 07/21/2019 1448   BILITOT 0.50 03/13/2017 1143       Impression and Plan: Kristy Olson is a very pleasant 85 yo caucasian female with history of stage I tubulo-lobular carcinoma of the left breast with lumpectomy in August 2017. She is doing quite well on Femara and exam today was negative.  I spoke with Dr. Marin Olp and no mammogram needed  at this time.  We will plan to see her back in another 6 months.  She will contact our office with any questions or concerns. We can certainly see her sooner if needed.  Laverna Peace, NP 11/30/20203:28 PM

## 2019-07-22 ENCOUNTER — Telehealth: Payer: Self-pay | Admitting: Family

## 2019-07-22 NOTE — Telephone Encounter (Signed)
Appointments scheduled letter/calendar mailed per 11/30 los

## 2019-07-28 ENCOUNTER — Other Ambulatory Visit: Payer: Self-pay | Admitting: *Deleted

## 2019-07-28 DIAGNOSIS — C50112 Malignant neoplasm of central portion of left female breast: Secondary | ICD-10-CM

## 2019-07-28 DIAGNOSIS — C73 Malignant neoplasm of thyroid gland: Secondary | ICD-10-CM

## 2019-07-28 MED ORDER — LETROZOLE 2.5 MG PO TABS: ORAL_TABLET | ORAL | 0 refills | Status: DC

## 2019-09-19 ENCOUNTER — Telehealth: Payer: Self-pay

## 2019-09-19 NOTE — Telephone Encounter (Signed)
Yes she can have it

## 2019-09-19 NOTE — Telephone Encounter (Signed)
Kristy Olson's daughter, Kristy Olson, and wanted to know if Kristy Olson should have the vaccine with all her current medications. I advised as below. She still wants Dr Sheppard Coil to view her chart and advise yes or no.   Kristy Olson's phone - [phone]  Our providers are recommending the vaccine to all our patients, especially if you are a patient who has medical conditions that make you high-risk for serious COVID disease.   As of right now there is NO known contraindication to getting the COVID vaccine. Meaning: everyone should be able to get this vaccine, even if they have other medical conditions or are pregnant. Side effects are always possible, but as with all vaccines, we believe the benefits outweigh the risks.   Important to remember: when people are having "reactions" to the vaccine, these reactions are almost always NORMAL immune system responses. OK to take Benadryl/diphenhydramine or Motrin/ibuprofen for these symptoms. If having any trouble breathing (signs of anaphylaxis) go to UC or ED. Very few people are having immune responses so strong they need to go to ED, but many people are not feeling well for 1 to 2 days after the vaccine is given.   We currently do not have any information about whether our office will be distributing the COVID vaccine. You can contact your local Health Department or go online to  [email or web address] For questions related to vaccine distribution or appointments, please email [email or web address] or call [phone].

## 2019-09-19 NOTE — Telephone Encounter (Signed)
Left message advising of recommendations.  

## 2019-10-06 ENCOUNTER — Encounter: Payer: Self-pay | Admitting: Nurse Practitioner

## 2019-10-06 ENCOUNTER — Ambulatory Visit (INDEPENDENT_AMBULATORY_CARE_PROVIDER_SITE_OTHER): Payer: Medicare Other | Admitting: Nurse Practitioner

## 2019-10-06 ENCOUNTER — Other Ambulatory Visit: Payer: Self-pay

## 2019-10-06 VITALS — BP 141/61 | HR 65 | Temp 98.0°F | Ht 67.0 in | Wt 128.1 lb

## 2019-10-06 DIAGNOSIS — I2510 Atherosclerotic heart disease of native coronary artery without angina pectoris: Secondary | ICD-10-CM | POA: Diagnosis not present

## 2019-10-06 DIAGNOSIS — E890 Postprocedural hypothyroidism: Secondary | ICD-10-CM

## 2019-10-06 DIAGNOSIS — I959 Hypotension, unspecified: Secondary | ICD-10-CM

## 2019-10-06 MED ORDER — METOPROLOL SUCCINATE ER 25 MG PO TB24: ORAL_TABLET | ORAL | 1 refills | Status: DC

## 2019-10-06 NOTE — Patient Instructions (Addendum)
Take the metoprolol (Troprol XL) 1/2 tab (12.5mg ) by mouth on Monday- Wednesday- Friday- Saturday every week. Keep monitoring your blood pressure when you go to the wound care doctor. If your blood pressure starts to get too high and stays high then let us know.    Hypotension As your heart beats, it forces blood through your body. This force is called blood pressure. If you have hypotension, you have low blood pressure. When your blood pressure is too low, you may not get enough blood to your brain or other parts of your body. This may cause you to feel weak, light-headed, have a fast heartbeat, or even pass out (faint). Low blood pressure may be harmless, or it may cause serious problems. What are the causes?  Blood loss.  Not enough water in the body (dehydration).  Heart problems.  Hormone problems.  Pregnancy.  A very bad infection.  Not having enough of certain nutrients.  Very bad allergic reactions.  Certain medicines. What increases the risk?  Age. The risk increases as you get older.  Conditions that affect the heart or the brain and spinal cord (central nervous system).  Taking certain medicines.  Being pregnant. What are the signs or symptoms?  Feeling: ? Weak. ? Light-headed. ? Dizzy. ? Tired (fatigued).  Blurred vision.  Fast heartbeat.  Passing out, in very bad cases. How is this treated?  Changing your diet. This may involve eating more salt (sodium) or drinking more water.  Taking medicines to raise your blood pressure.  Changing how much you take (the dosage) of some of your medicines.  Wearing compression stockings. These stockings help to prevent blood clots and reduce swelling in your legs. In some cases, you may need to go to the hospital for:  Fluid replacement. This means you will receive fluids through an IV tube.  Blood replacement. This means you will receive donated blood through an IV tube (transfusion).  Treating an infection  or heart problems, if this applies.  Monitoring. You may need to be monitored while medicines that you are taking wear off. Follow these instructions at home: Eating and drinking   Drink enough fluids to keep your pee (urine) pale yellow.  Eat a healthy diet. Follow instructions from your doctor about what you can eat or drink. A healthy diet includes: ? Fresh fruits and vegetables. ? Whole grains. ? Low-fat (lean) meats. ? Low-fat dairy products.  Eat extra salt only as told. Do not add extra salt to your diet unless your doctor tells you to.  Eat small meals often.  Avoid standing up quickly after you eat. Medicines  Take over-the-counter and prescription medicines only as told by your doctor. ? Follow instructions from your doctor about changing how much you take of your medicines, if this applies. ? Do not stop or change any of your medicines on your own. General instructions   Wear compression stockings as told by your doctor.  Get up slowly from lying down or sitting.  Avoid hot showers and a lot of heat as told by your doctor.  Return to your normal activities as told by your doctor. Ask what activities are safe for you.  Do not use any products that contain nicotine or tobacco, such as cigarettes, e-cigarettes, and chewing tobacco. If you need help quitting, ask your doctor.  Keep all follow-up visits as told by your doctor. This is important. Contact a doctor if:  You throw up (vomit).  You have watery poop (diarrhea).  You have a fever for more than 2-3 days.  You feel more thirsty than normal.  You feel weak and tired. Get help right away if:  You have chest pain.  You have a fast or uneven heartbeat.  You lose feeling (have numbness) in any part of your body.  You cannot move your arms or your legs.  You have trouble talking.  You get sweaty or feel light-headed.  You pass out.  You have trouble breathing.  You have trouble staying  awake.  You feel mixed up (confused). Summary  Hypotension is also called low blood pressure. It is when the force of blood pumping through your arteries is too weak.  Hypotension may be harmless, or it may cause serious problems.  Treatment may include changing your diet and medicines, and wearing compression stockings.  In very bad cases, you may need to go to the hospital. This information is not intended to replace advice given to you by your health care provider. Make sure you discuss any questions you have with your health care provider. Document Revised: 01/31/2018 Document Reviewed: 01/31/2018 Elsevier Patient Education  [place].

## 2019-10-06 NOTE — Progress Notes (Signed)
Acute Office Visit  Subjective:    Patient ID: Kristy Olson, female    DOB: [birth year], 86 y.o.   MRN: [id number]  Chief Complaint  Patient presents with  . Hypotension    HPI Patient is in today for low blood pressure noted at recent wound care visit. Patient reports weekly visits with wound care with monitoring of vitals at each visit. She states that her most recent visit she was told her blood pressure was very low and she needed to follow-up with her PCP about this.  At that visit they reportedly took her blood pressure multiple times and the best blood pressure reading was 118/38.  She denies dizziness, chest discomfort, shortness of breath, nausea, paresthesias, changes in mental status, blood loss, hematuria, hematemesis, or melena.  She reports she is currently taking metoprolol 12.5 mg (1/2 tab) daily.  She is unsure when the dosage was changed from 25 mg but feels that it has been in the last several months.  She is also taking Synthroid daily but admits that she misses doses at times because she prefers to take them at bedtime and falls asleep prior to taking the medication.  Past Medical History:  Diagnosis Date  . Abnormal finding on MRI of brain   . Breast cancer of upper-inner quadrant of left female breast ([place]) 10/12/2015  . CAD (coronary artery disease)   . Chronic venous insufficiency    Compression stockings, Dr. Carmine Savoy to evaluate late May 2015   . Degeneration of lumbar or lumbosacral intervertebral disc   . History of thyroid cancer    With reported brain mets.   . Hyperlipidemia   . Hypothyroid    Outside records report synthroid 190mg despite her report of 1025m. 02/2014 awaiting clarification   . Idiopathic scoliosis   . Osteoporosis    July 2014 DEXA   . PNEUMONIA, COMMUNITY ACQUIRED, PNEUMOCOCCAL   . Venous stasis ulcer ([facility]    Past Surgical History:  Procedure Laterality Date  . BREAST LUMPECTOMY WITH RADIOACTIVE SEED LOCALIZATION Left  03/29/2016   Procedure: LEFT BREAST LUMPECTOMY WITH RADIOACTIVE SEED LOCALIZATION;  Surgeon: PaAutumn MessingII, MD;  [facility] Service: General;  Laterality: Left;  LEFT BREAST LUMPECTOMY WITH RADIOACTIVE SEED LOCALIZATION  . cranotomy    . THYROIDECTOMY      Family History  Problem Relation Age of Onset  . CAD Sister   . Cancer Sister   . Heart disease Sister   . Diabetes Father   . COPD Father     Social History   Socioeconomic History  . Marital status: Widowed    Spouse name: Not on file  . Number of children: 2  . Years of education: Not on file  . Highest education level: Not on file  Occupational History    Employer: RETIRED  Tobacco Use  . Smoking status: Never Smoker  . Smokeless tobacco: Never Used  Substance and Sexual Activity  . Alcohol use: Yes    Alcohol/week: 0.0 standard drinks    Comment: Occasional  . Drug use: No  . Sexual activity: Not on file  Other Topics Concern  . Not on file  Social History Narrative  . Not on file   Social Determinants of Health   Financial Resource Strain:   . Difficulty of Paying Living Expenses: Not on file  Food Insecurity:   . Worried About RuCharity fundraisern the Last Year: Not on file  . Ran Out of  Food in the Last Year: Not on file  Transportation Needs:   . Lack of Transportation (Medical): Not on file  . Lack of Transportation (Non-Medical): Not on file  Physical Activity:   . Days of Exercise per Week: Not on file  . Minutes of Exercise per Session: Not on file  Stress:   . Feeling of Stress : Not on file  Social Connections:   . Frequency of Communication with Friends and Family: Not on file  . Frequency of Social Gatherings with Friends and Family: Not on file  . Attends Religious Services: Not on file  . Active Member of Clubs or Organizations: Not on file  . Attends Archivist Meetings: Not on file  . Marital Status: Not on file  Intimate Partner Violence:   .  Fear of Current or Ex-Partner: Not on file  . Emotionally Abused: Not on file  . Physically Abused: Not on file  . Sexually Abused: Not on file    Outpatient Medications Prior to Visit  Medication Sig Dispense Refill  . aspirin 81 MG tablet Take 81 mg by mouth daily.    Marland Kitchen letrozole (FEMARA) 2.5 MG tablet TAKE 1 TABLET(2.5 MG) BY MOUTH DAILY 90 tablet 0  . metoprolol succinate (TOPROL-XL) 25 MG 24 hr tablet TAKE 1 TABLET(25 MG) BY MOUTH DAILY 90 tablet 1  . SYNTHROID 150 MCG tablet TAKE 1 TABLET(150 MCG) BY MOUTH DAILY 30 tablet 0  . cephALEXin (KEFLEX) 250 MG capsule     . gabapentin (NEURONTIN) 100 MG capsule TAKE 1 TO 3 CAPSULES BY MOUTH THREE TIMES DAILY AS NEEDED FOR ULCER PAIN (Patient not taking: Reported on 10/06/2019) 60 capsule 1  . Omega-3 Fatty Acids (FISH OIL) 1000 MG CAPS Take by mouth.    . pantoprazole (PROTONIX) 40 MG tablet Take 1 tablet (40 mg total) by mouth daily. (Patient not taking: Reported on 10/06/2019) 30 tablet 11   No facility-administered medications prior to visit.    Allergies  Allergen Reactions  . Neomy-Bacit-Polymyx-Pramoxine Itching  . Amoxicillin Hives and Other (See Comments)  . Colesevelam Hcl Other (See Comments)  . Doxycycline Hives and Other (See Comments)  . Levaquin  [Levofloxacin In D5w] Other (See Comments)  . Levofloxacin Other (See Comments)  . Metoprolol Other (See Comments)    Other reaction(s): unknown  . Other Itching and Rash    curad triple antibiotic ointment. Curad Triple Antibiotic Ointment  . Statins Hives and Other (See Comments)    Hives Other reaction(s): unknown  . Colesevelam Hives  . Neomycin Hives    Allergic reaction only topically. Only topically  . Red Dye Hives and Itching    Red frosting caused itching and facial swelling  Patient is uncertain this reaction occurred (02/21/18) currently eats foods with red dye  . Tape Hives and Itching    Review of Systems  Constitutional: Negative for chills, fatigue  and fever.  Respiratory: Negative for cough, chest tightness, shortness of breath and wheezing.   Cardiovascular: Negative for chest pain, palpitations and leg swelling.  Gastrointestinal: Negative for abdominal pain, blood in stool, diarrhea and rectal pain.  Genitourinary: Negative for hematuria and vaginal bleeding.  Neurological: Negative for dizziness, tremors, syncope, weakness, light-headedness, numbness and headaches.  Hematological: Does not bruise/bleed easily.  Psychiatric/Behavioral: Negative for sleep disturbance.       Objective:    Physical Exam Vitals and nursing note reviewed.  Constitutional:      Appearance: Normal appearance.  HENT:  Head: Normocephalic.     Nose: Nose normal.  Eyes:     Pupils: Pupils are equal, round, and reactive to light.  Cardiovascular:     Rate and Rhythm: Normal rate and regular rhythm.     Chest Wall: PMI is not displaced. No thrill.     Pulses: Normal pulses.     Heart sounds: Normal heart sounds, S1 normal and S2 normal. No murmur.  Pulmonary:     Effort: Pulmonary effort is normal. No respiratory distress.     Breath sounds: Normal breath sounds. No wheezing or rhonchi.  Abdominal:     General: Abdomen is flat. Bowel sounds are normal. There is no distension.     Palpations: Abdomen is soft.     Tenderness: There is no abdominal tenderness. There is no guarding.  Musculoskeletal:     Cervical back: Normal range of motion.     Right lower leg: No edema.     Left lower leg: Edema present.  Skin:    General: Skin is warm and dry.     Capillary Refill: Capillary refill takes less than 2 seconds.     Coloration: Skin is not jaundiced.     Findings: No bruising.  Neurological:     General: No focal deficit present.     Mental Status: She is alert and oriented to person, place, and time.  Psychiatric:        Mood and Affect: Mood normal.        Behavior: Behavior normal.     BP (!) 141/61   Pulse 65   Temp 98 F (36.7  C) (Oral)   Ht 5' 7"  (1.702 m)   Wt 128 lb 1.9 oz (58.1 kg)   SpO2 96%   BMI 20.07 kg/m  Wt Readings from Last 3 Encounters:  10/06/19 128 lb 1.9 oz (58.1 kg)  07/21/19 123 lb 1.9 oz (55.8 kg)  06/03/19 121 lb 1.9 oz (54.9 kg)    There are no preventive care reminders to display for this patient.  There are no preventive care reminders to display for this patient.   Lab Results  Component Value Date   TSH 0.14 (L) 05/02/2018   Lab Results  Component Value Date   WBC 5.6 07/21/2019   HGB 11.4 (L) 07/21/2019   HCT 34.4 (L) 07/21/2019   MCV 93.5 07/21/2019   PLT 168 07/21/2019   Lab Results  Component Value Date   NA 139 07/21/2019   K 4.3 07/21/2019   CHLORIDE 104 03/13/2017   CO2 29 07/21/2019   GLUCOSE 83 07/21/2019   BUN 26 (H) 07/21/2019   CREATININE 1.14 (H) 07/21/2019   BILITOT 0.4 07/21/2019   ALKPHOS 66 07/21/2019   AST 17 07/21/2019   ALT 10 07/21/2019   PROT 6.7 07/21/2019   ALBUMIN 4.3 07/21/2019   CALCIUM 8.8 (L) 07/21/2019   ANIONGAP 7 07/21/2019   EGFR 62 (L) 03/13/2017   GFR 58.56 (L) 11/23/2014   Lab Results  Component Value Date   CHOL 251 (H) 08/03/2017   Lab Results  Component Value Date   HDL 59 08/03/2017   Lab Results  Component Value Date   LDLCALC 162 (H) 08/03/2017   Lab Results  Component Value Date   TRIG 154 (H) 08/03/2017   Lab Results  Component Value Date   CHOLHDL 4.3 08/03/2017   Lab Results  Component Value Date   HGBA1C 5.5 01/29/2017       Assessment & Plan:  1. Hypotension, unspecified hypotension type Chart review reveals several incidences of hypotension noted at wound care clinic -most recent diastolic blood pressure into the 30s.  This is a change from previous blood pressure readings that were consistently in the 100s/60s. it is currently unclear why the most recent readings have been dropping.   Patient is already taking 12.5 mg of metoprolol every day- this is different from the 25 mg/day listed  in the chart. We will taper this dosage down to 12.5 mg every other day.  Given the patient's inconsistent use of Synthroid will also assess TSH today to determine if changes need to be made to the dosage. Will monitor CBC today to ensure hemoglobin levels are appropriate. Follow-up with the patient in 4 weeks or sooner if blood pressure continue to drop. - TSH - CBC with Differential/Platelet  2. Hypothyroidism, postsurgical This patient reports difficulties remembering to take her Synthroid at night due to having to wait 2 hours or more after eating before taking the medication and a personal preference of taking at night.  While it is not ideal, we may want to consider allowing the patient to take with dinner and adjusting the dose appropriately for this to ensure consistency with taking medication.  She is followed by endocrinology for this will be happy to collaborate to help determine the best course of action for the patient. Will obtain TSH today to determine if changes need to be made in follow-up with the patient. - TSH  3. Coronary artery disease involving native coronary artery of native heart without angina pectoris We will change metoprolol to 12.5 mg by mouth every other day to determine if this has any effect on the patient's blood pressure.  If the patient's blood pressure continues to remain low we may need to consider discontinuing this medication altogether. We will follow up in approximately 4 weeks or sooner on blood pressure. - metoprolol succinate (TOPROL-XL) 25 MG 24 hr tablet; Take 1/2 tab (12.69m) by mouth every other day.  Dispense: 60 tablet; Refill: 1   SOrma Render NP

## 2019-10-07 ENCOUNTER — Other Ambulatory Visit: Payer: Self-pay | Admitting: Nurse Practitioner

## 2019-10-07 DIAGNOSIS — E890 Postprocedural hypothyroidism: Secondary | ICD-10-CM

## 2019-10-07 LAB — CBC WITH DIFFERENTIAL/PLATELET
Absolute Monocytes: 465 cells/uL (ref 200–950)
Basophils Absolute: 62 cells/uL (ref 0–200)
Basophils Relative: 1.1 %
Eosinophils Absolute: 241 cells/uL (ref 15–500)
Eosinophils Relative: 4.3 %
HCT: 35.2 % (ref 35.0–45.0)
Hemoglobin: 11.7 g/dL (ref 11.7–15.5)
Lymphs Abs: 1361 cells/uL (ref 850–3900)
MCH: 31 pg (ref 27.0–33.0)
MCHC: 33.2 g/dL (ref 32.0–36.0)
MCV: 93.1 fL (ref 80.0–100.0)
MPV: 11.3 fL (ref 7.5–12.5)
Monocytes Relative: 8.3 %
Neutro Abs: 3472 cells/uL (ref 1500–7800)
Neutrophils Relative %: 62 %
Platelets: 199 10*3/uL (ref 140–400)
RBC: 3.78 10*6/uL — ABNORMAL LOW (ref 3.80–5.10)
RDW: 13 % (ref 11.0–15.0)
Total Lymphocyte: 24.3 %
WBC: 5.6 10*3/uL (ref 3.8–10.8)

## 2019-10-07 LAB — TSH: TSH: 11.58 mIU/L — ABNORMAL HIGH (ref 0.40–4.50)

## 2019-10-07 NOTE — Progress Notes (Signed)
TSH significantly elevated. The patient endorsed inconsistent adherence to daily medication management at visit due to falling asleep and forgetting to take the medication. At this time, I feel it is most important to ensure the patient is taking the medication consistently at the same time every day and the dose can be adjusted for ingestion with food. The patients preference is to take the medication in the evening, therefore I have asked her to begin taking the medication every day before she goes to the dining room for dinner. I am hopeful this will give her at least a 30 minute window prior to eating and keep her on a more consistent regimen. I will recheck TSH in 6 weeks.

## 2019-11-03 ENCOUNTER — Ambulatory Visit: Payer: Medicare Other | Admitting: Nurse Practitioner

## 2019-11-06 ENCOUNTER — Ambulatory Visit (INDEPENDENT_AMBULATORY_CARE_PROVIDER_SITE_OTHER): Payer: Medicare Other | Admitting: Nurse Practitioner

## 2019-11-06 ENCOUNTER — Other Ambulatory Visit: Payer: Self-pay

## 2019-11-06 ENCOUNTER — Encounter: Payer: Self-pay | Admitting: Nurse Practitioner

## 2019-11-06 VITALS — BP 109/65 | HR 76 | Wt 127.1 lb

## 2019-11-06 DIAGNOSIS — I959 Hypotension, unspecified: Secondary | ICD-10-CM | POA: Diagnosis not present

## 2019-11-06 DIAGNOSIS — E890 Postprocedural hypothyroidism: Secondary | ICD-10-CM | POA: Diagnosis not present

## 2019-11-06 NOTE — Patient Instructions (Signed)
Try taking your thyroid medication at the same time every day.   Try to take your medication right before you go down to dinner. This will give you the mandatory 30 minutes prior to eating.   We will recheck your lab work in 6 weeks from today.   Urinary Incontinence  Urinary incontinence refers to a condition in which a person is unable to control where and when to pass urine. A person with this condition will urinate when he or she does not mean to (involuntarily). What are the causes? This condition may be caused by:  Medicines.  Infections.  Constipation.  Overactive bladder muscles.  Weak bladder muscles.  Weak pelvic floor muscles. These muscles provide support for the bladder, intestine, and, in women, the uterus.  Enlarged prostate in men. The prostate is a gland near the bladder. When it gets too big, it can pinch the urethra. With the urethra blocked, the bladder can weaken and lose the ability to empty properly.  Surgery.  Emotional factors, such as anxiety, stress, or post-traumatic stress disorder (PTSD).  Pelvic organ prolapse. This happens in women when organs shift out of place and into the vagina. This shift can prevent the bladder and urethra from working properly. What increases the risk? The following factors may make you more likely to develop this condition:  Older age.  Obesity and physical inactivity.  Pregnancy and childbirth.  Menopause.  Diseases that affect the nerves or spinal cord (neurological diseases).  Long-term (chronic) coughing. This can increase pressure on the bladder and pelvic floor muscles. What are the signs or symptoms? Symptoms may vary depending on the type of urinary incontinence you have. They include:  A sudden urge to urinate, but passing urine involuntarily before you can get to a bathroom (urge incontinence).  Suddenly passing urine with any activity that forces urine to pass, such as coughing, laughing, exercise,  or sneezing (stress incontinence).  Needing to urinate often, but urinating only a small amount, or constantly dribbling urine (overflow incontinence).  Urinating because you cannot get to the bathroom in time due to a physical disability, such as arthritis or injury, or communication and thinking problems, such as Alzheimer disease (functional incontinence). How is this diagnosed? This condition may be diagnosed based on:  Your medical history.  A physical exam.  Tests, such as: ? Urine tests. ? X-rays of your kidney and bladder. ? Ultrasound. ? CT scan. ? Cystoscopy. In this procedure, a health care provider inserts a tube with a light and camera (cystoscope) through the urethra and into the bladder in order to check for problems. ? Urodynamic testing. These tests assess how well the bladder, urethra, and sphincter can store and release urine. There are different types of urodynamic tests, and they vary depending on what the test is measuring. To help diagnose your condition, your health care provider may recommend that you keep a log of when you urinate and how much you urinate. How is this treated? Treatment for this condition depends on the type of incontinence that you have and its cause. Treatment may include:  Lifestyle changes, such as: ? Quitting smoking. ? Maintaining a healthy weight. ? Staying active. Try to get 150 minutes of moderate-intensity exercise every week. Ask your health care provider which activities are safe for you. ? Eating a healthy diet.  Avoid high-fat foods, like fried foods.  Avoid refined carbohydrates like white bread and white rice.  Limit how much alcohol and caffeine you drink.  Increase your fiber intake. Foods such as fresh fruits, vegetables, beans, and whole grains are healthy sources of fiber.  Pelvic floor muscle exercises.  Bladder training, such as lengthening the amount of time between bathroom breaks, or using the bathroom at  regular intervals.  Using techniques to suppress bladder urges. This can include distraction techniques or controlled breathing exercises.  Medicines to relax the bladder muscles and prevent bladder spasms.  Medicines to help slow or prevent the growth of a man's prostate.  Botox injections. These can help relax the bladder muscles.  Using pulses of electricity to help change bladder reflexes (electrical nerve stimulation).  For women, using a medical device to prevent urine leaks. This is a small, tampon-like, disposable device that is inserted into the urethra.  Injecting collagen or carbon beads (bulking agents) into the urinary sphincter. These can help thicken tissue and close the bladder opening.  Surgery. Follow these instructions at home: Lifestyle  Limit alcohol and caffeine. These can fill your bladder quickly and irritate it.  Keep yourself clean to help prevent odors and skin damage. Ask your doctor about special skin creams and cleansers that can protect the skin from urine.  Consider wearing pads or adult diapers. Make sure to change them regularly, and always change them right after experiencing incontinence. General instructions  Take over-the-counter and prescription medicines only as told by your health care provider.  Use the bathroom about every 3-4 hours, even if you do not feel the need to urinate. Try to empty your bladder completely every time. After urinating, wait a minute. Then try to urinate again.  Make sure you are in a relaxed position while urinating.  If your incontinence is caused by nerve problems, keep a log of the medicines you take and the times you go to the bathroom.  Keep all follow-up visits as told by your health care provider. This is important. Contact a health care provider if:  You have pain that gets worse.  Your incontinence gets worse. Get help right away if:  You have a fever or chills.  You are unable to urinate.  You  have redness in your groin area or down your legs. Summary  Urinary incontinence refers to a condition in which a person is unable to control where and when to pass urine.  This condition may be caused by medicines, infection, weak bladder muscles, weak pelvic floor muscles, enlargement of the prostate (in men), or surgery.  The following factors increase your risk for developing this condition: older age, obesity, pregnancy and childbirth, menopause, neurological diseases, and chronic coughing.  There are several types of urinary incontinence. They include urge incontinence, stress incontinence, overflow incontinence, and functional incontinence.  This condition is usually treated first with lifestyle and behavioral changes, such as quitting smoking, eating a healthier diet, and doing regular pelvic floor exercises. Other treatment options include medicines, bulking agents, medical devices, electrical nerve stimulation, or surgery. This information is not intended to replace advice given to you by your health care provider. Make sure you discuss any questions you have with your health care provider. Document Revised: 08/17/2017 Document Reviewed: 11/16/2016 Elsevier Patient Education  [place].

## 2019-11-06 NOTE — Progress Notes (Signed)
Established Patient Office Visit  Subjective:  Patient ID: Kristy Olson, female    DOB: Mar 27, 1932  Age: 86 y.o. MRN: [id number]  CC: Follow-up hypotension and thyroid  HPI Kristy Olson presents for follow-up for hypotension and her thyroid.  Patient reports she has been drinking consciously attempting to drink at least 7 glasses of water a day.  She reports her blood pressures have improved at wound care.  She has been taking the metoprolol 12.5 mg every other day as directed.  She denies dizziness, nausea, vomiting, changes in vision, lightheadedness, syncopal episodes.  At the last visit the patient TSH was elevated above 12.  We were unable to reach her by telephone but a letter was sent in the mail commended beginning to take her Synthroid with meals as opposed to waiting till before bed which was her preference.  The patient reports she has still been falling asleep before taking the medication and is missing several doses.  She was unaware that she should be taking the medication prior to dinner as discussed in the letter.  She does report hesitancy taking the medication before midnight.  She reports a endocrinologist in the past told her that it must be taken at least 2 hours after a meal and therefore she feels that midnight or 1230 is the best time since she usually has a snack around 10 PM.  She does report having some paresthesias in her hands and feet and significant fatigue lately.  She also reports muscle cramping in her hands.    Past Medical History:  Diagnosis Date  . Abnormal finding on MRI of brain   . Breast cancer of upper-inner quadrant of left female breast ([place]) 10/12/2015  . CAD (coronary artery disease)   . Chronic venous insufficiency    Compression stockings, Dr. Carmine Savoy to evaluate late May 2015   . Degeneration of lumbar or lumbosacral intervertebral disc   . History of thyroid cancer    With reported brain mets.   . Hyperlipidemia   . Hypothyroid     Outside records report synthroid 143mg despite her report of 1032m. 02/2014 awaiting clarification   . Idiopathic scoliosis   . Osteoporosis    July 2014 DEXA   . PNEUMONIA, COMMUNITY ACQUIRED, PNEUMOCOCCAL   . [place]    Past Surgical History:  Procedure Laterality Date  . BREAST LUMPECTOMY WITH RADIOACTIVE SEED LOCALIZATION Left 03/29/2016   Procedure: LEFT BREAST LUMPECTOMY WITH RADIOACTIVE SEED LOCALIZATION;  Surgeon: PaAutumn MessingII, MD;  [facility]: [facility];  Laterality: Left;  LEFT BREAST LUMPECTOMY WITH RADIOACTIVE SEED LOCALIZATION  . cranotomy    . THYROIDECTOMY      Family History  Problem Relation Age of Onset  . CAD Sister   . Cancer Sister   . Heart disease Sister   . Diabetes Father   . COPD Father     Social History   Socioeconomic History  . Marital status: Widowed    Spouse name: Not on file  . Number of children: 2  . Years of education: Not on file  . Highest education level: Not on file  Occupational History    Employer: RETIRED  Tobacco Use  . Smoking status: Never Smoker  . Smokeless tobacco: Never Used  Substance and Sexual Activity  . Alcohol use: Yes    Alcohol/week: 0.0 standard drinks    Comment: Occasional  . Drug use: No  . Sexual activity: Not on file  Other Topics Concern  . Not on file  Social History Narrative  . Not on file   Social Determinants of Health   Financial Resource Strain:   . Difficulty of Paying Living Expenses:   Food Insecurity:   . Worried About Charity fundraiser in the Last Year:   . Arboriculturist in the Last Year:   Transportation Needs:   . Film/video editor (Medical):   Marland Kitchen Lack of Transportation (Non-Medical):   Physical Activity:   . Days of Exercise per Week:   . Minutes of Exercise per Session:   Stress:   . Feeling of Stress :   Social Connections:   . Frequency of Communication with Friends and Family:   . Frequency of Social  Gatherings with Friends and Family:   . Attends Religious Services:   . Active Member of Clubs or Organizations:   . Attends Archivist Meetings:   Marland Kitchen Marital Status:   Intimate Partner Violence:   . Fear of Current or Ex-Partner:   . Emotionally Abused:   Marland Kitchen Physically Abused:   . Sexually Abused:     Outpatient Medications Prior to Visit  Medication Sig Dispense Refill  . aspirin 81 MG tablet Take 81 mg by mouth daily.    . cephALEXin (KEFLEX) 250 MG capsule     . letrozole (FEMARA) 2.5 MG tablet TAKE 1 TABLET(2.5 MG) BY MOUTH DAILY 90 tablet 0  . metoprolol succinate (TOPROL-XL) 25 MG 24 hr tablet Take 1/2 tab (12.'5mg'$ ) by mouth every other day. 60 tablet 1  . Omega-3 Fatty Acids (FISH OIL) 1000 MG CAPS Take by mouth.    . SYNTHROID 150 MCG tablet TAKE 1 TABLET(150 MCG) BY MOUTH DAILY 30 tablet 0   No facility-administered medications prior to visit.    Allergies  Allergen Reactions  . Neomy-Bacit-Polymyx-Pramoxine Itching  . Amoxicillin Hives and Other (See Comments)  . Colesevelam Hcl Other (See Comments)  . Doxycycline Hives and Other (See Comments)  . Levaquin  [Levofloxacin In D5w] Other (See Comments)  . Levofloxacin Other (See Comments)  . Other Itching and Rash    curad triple antibiotic ointment. Curad Triple Antibiotic Ointment  . Statins Hives and Other (See Comments)    Hives Other reaction(s): unknown  . Colesevelam Hives  . Neomycin Hives    Allergic reaction only topically. Only topically  . Red Dye Hives and Itching    Red frosting caused itching and facial swelling  Patient is uncertain this reaction occurred (02/21/18) currently eats foods with red dye  . Tape Hives and Itching    ROS Review of Systems  Constitutional: Positive for fatigue. Negative for chills and fever.  Respiratory: Negative for cough, choking, chest tightness and shortness of breath.   Cardiovascular: Negative for chest pain, palpitations and leg swelling.   Gastrointestinal: Negative for constipation, diarrhea, nausea and vomiting.  Endocrine: Positive for cold intolerance.  Genitourinary: Positive for frequency and urgency.  Musculoskeletal: Positive for arthralgias and myalgias.  Neurological: Positive for weakness and numbness. Negative for dizziness, tremors, syncope, light-headedness and headaches.  Psychiatric/Behavioral: Positive for sleep disturbance.      Objective:    Physical Exam  Constitutional: She is oriented to person, place, and time. She appears well-developed and well-nourished.  HENT:  Head: Normocephalic.  Eyes: Pupils are equal, round, and reactive to light.  Neck: No JVD present.  Cardiovascular: Normal rate, regular rhythm and normal heart sounds.  Pulmonary/Chest: Effort normal and breath sounds  normal.  Abdominal: Soft. Bowel sounds are normal.  Musculoskeletal:        General: No edema. Normal range of motion.     Cervical back: Normal range of motion.  Neurological: She is alert and oriented to person, place, and time. Coordination normal.  Skin: Skin is warm and dry. Nails show clubbing.  Patient skin is intact however very dry. Patient's fingernails contain striations and pitting.  Psychiatric: She has a normal mood and affect. Her behavior is normal. Judgment and thought content normal.  Nursing note and vitals reviewed.   BP 109/65 (BP Location: Left Arm, Patient Position: Sitting, Cuff Size: Normal)   Pulse 76   Wt 127 lb 1.9 oz (57.7 kg)   BMI 19.91 kg/m  Wt Readings from Last 3 Encounters:  11/06/19 127 lb 1.9 oz (57.7 kg)  10/06/19 128 lb 1.9 oz (58.1 kg)  07/21/19 123 lb 1.9 oz (55.8 kg)     There are no preventive care reminders to display for this patient.  There are no preventive care reminders to display for this patient.  Lab Results  Component Value Date   TSH 11.58 (H) 10/06/2019   Lab Results  Component Value Date   WBC 5.6 10/06/2019   HGB 11.7 10/06/2019   HCT 35.2  10/06/2019   MCV 93.1 10/06/2019   PLT 199 10/06/2019   Lab Results  Component Value Date   NA 139 07/21/2019   K 4.3 07/21/2019   CHLORIDE 104 03/13/2017   CO2 29 07/21/2019   GLUCOSE 83 07/21/2019   BUN 26 (H) 07/21/2019   CREATININE 1.14 (H) 07/21/2019   BILITOT 0.4 07/21/2019   ALKPHOS 66 07/21/2019   AST 17 07/21/2019   ALT 10 07/21/2019   PROT 6.7 07/21/2019   ALBUMIN 4.3 07/21/2019   CALCIUM 8.8 (L) 07/21/2019   ANIONGAP 7 07/21/2019   EGFR 62 (L) 03/13/2017   GFR 58.56 (L) 11/23/2014   Lab Results  Component Value Date   CHOL 251 (H) 08/03/2017   Lab Results  Component Value Date   HDL 59 08/03/2017   Lab Results  Component Value Date   LDLCALC 162 (H) 08/03/2017   Lab Results  Component Value Date   TRIG 154 (H) 08/03/2017   Lab Results  Component Value Date   CHOLHDL 4.3 08/03/2017   Lab Results  Component Value Date   HGBA1C 5.5 01/29/2017      Assessment & Plan:   1. Hypothyroidism, postsurgical Patient's most recent thyroid levels were elevated as expected.  I have a strong suspicion that the paresthesias she is experiencing are due to her thyroid levels not being under control.  I discussed this with the patient and stressed the importance of taking medication every day, preferably at the same time.  Discussed with patient finding a time that best works with her schedule and we can work around taking the medication with meals if needed. Is going to try to continue to take the medication around midnight which is her preference however if this is not effective in 2 weeks she has been instructed to begin taking the medication right before she goes down for dinner at 530. We will check her thyroid in approximately 6 to 8 weeks and follow-up with a visit.  - TSH; Future  2. Hypotension, unspecified hypotension type The patient's hypotension is much improved with a decrease of metoprolol and increase in fluid intake.  She does report some episodes  of incontinent's due to inability to  make it to the bathroom in time with the fluid increase.  We will keep her on the metoprolol 12.5 mg every other day as the schedule seems to be working fairly well for her. Gust with the patient the option of utilizing incontinent pads during the day to help with leaks and incontinent episodes.  Also provided the patient with information on urinary incontinence and Kegel exercises. We will follow up with the patient in approximately 6 to 8 weeks.  Follow-up: Return in about 6 weeks (around 12/18/2019) for thyroid.    Orma Render, NP

## 2019-11-27 ENCOUNTER — Other Ambulatory Visit: Payer: Self-pay | Admitting: Endocrinology

## 2019-11-27 DIAGNOSIS — L089 Local infection of the skin and subcutaneous tissue, unspecified: Secondary | ICD-10-CM | POA: Insufficient documentation

## 2019-12-04 ENCOUNTER — Other Ambulatory Visit: Payer: Self-pay | Admitting: Osteopathic Medicine

## 2019-12-04 ENCOUNTER — Other Ambulatory Visit: Payer: Self-pay | Admitting: Endocrinology

## 2019-12-04 DIAGNOSIS — E039 Hypothyroidism, unspecified: Secondary | ICD-10-CM

## 2019-12-09 ENCOUNTER — Telehealth: Payer: Self-pay

## 2019-12-09 NOTE — Telephone Encounter (Signed)
Kristy Olson called and left a message stating Kristy Olson fell and was sent to the ED.

## 2019-12-18 ENCOUNTER — Ambulatory Visit: Payer: Medicare Other | Admitting: Nurse Practitioner

## 2019-12-25 ENCOUNTER — Other Ambulatory Visit: Payer: Self-pay | Admitting: Hematology & Oncology

## 2019-12-25 DIAGNOSIS — C50112 Malignant neoplasm of central portion of left female breast: Secondary | ICD-10-CM

## 2019-12-25 DIAGNOSIS — C73 Malignant neoplasm of thyroid gland: Secondary | ICD-10-CM

## 2019-12-29 ENCOUNTER — Other Ambulatory Visit: Payer: Self-pay

## 2019-12-29 ENCOUNTER — Ambulatory Visit (INDEPENDENT_AMBULATORY_CARE_PROVIDER_SITE_OTHER): Payer: Medicare Other | Admitting: Nurse Practitioner

## 2019-12-29 ENCOUNTER — Encounter: Payer: Self-pay | Admitting: Nurse Practitioner

## 2019-12-29 VITALS — BP 124/44 | HR 72 | Ht 67.0 in | Wt 121.0 lb

## 2019-12-29 DIAGNOSIS — E890 Postprocedural hypothyroidism: Secondary | ICD-10-CM | POA: Diagnosis not present

## 2019-12-29 MED ORDER — SYNTHROID 150 MCG PO TABS: ORAL_TABLET | ORAL | 1 refills | Status: DC

## 2019-12-29 NOTE — Patient Instructions (Signed)
We will check your thyroid levels again today.   I will refill your Synthroid 150 mg, but if we need to make any changes I will let you know.  Keep taking your medication as directed. If you prefer to take it late at night try to get as consistent as possible.   You can change the time that you take the medication to take it before dinner if this helps you stay better on schedule. This may prevent you from getting too tired or falling asleep.

## 2019-12-29 NOTE — Progress Notes (Signed)
Established Patient Office Visit  Subjective:  Patient ID: Kristy Olson, female    DOB: 09/23/1931  Age: 86 y.o. MRN: [id number]  CC:  Chief Complaint  Patient presents with  . Hypothyroidism    HPI Kristy Olson presents for follow-up for hypothyroidism. At her last visit her TSH was elevated due to inconsistent administration of her levothyroxine. She has had difficulty in the past with remembering to take her medication because of falling asleep at night before taking the medication. We have discussed changing the time of day she takes her medication, but she is resistant to this idea and prefers to take it late at night before going to bed. This has been problematic as she has historically fallen asleep prior to taking the medication on a daily basis resulting in worsening hypothyroid symptoms and elevated TSH levels.   Today she reports that she is feeling well, overall and denies any new thyroid symptoms, although she admits her anxiety level is higher than normal due to increased stresses associated with activities coming up and trying to get ready for these. She has been trying to take her medication at the same time every day, but reports that the scheduling does vary between midnight and 0600 due to her falling asleep. She does report missing three days in a row due to running out of medication after having to change her appointment time due to a recent fall.   She did have a recent fall after tripping over the top step into her apartment which resulted in a large hematoma on her right knee. She was evaluated in the emergency room and subsequently with orthopedics and has been released.   HYPOTHYROIDISM Current Medication and Dosages: Synthroid 150 mcg Thyroid control status:better Satisfied with current treatment? yes Medication side effects: no Medication compliance: fair compliance Etiology of hypothyroidism: Surgical thyroid removal  Recent dose adjustment:no Fatigue:  yes Cold intolerance: no Heat intolerance: no Weight gain: no Weight loss: approximately 2 pounds since last visit.  Constipation: no Diarrhea/loose stools: no Palpitations: no Lower extremity edema: yes Anxiety/depressed mood: increased anxiety   GAD 7 : Generalized Anxiety Score 12/29/2019 05/02/2018  Nervous, Anxious, on Edge 2 1  Control/stop worrying 2 1  Worry too much - different things 2 1  Trouble relaxing 2 0  Restless 0 0  Easily annoyed or irritable 1 0  Afraid - awful might happen 2 1  Total GAD 7 Score 11 4  Anxiety Difficulty Somewhat difficult Not difficult at all       Past Medical History:  Diagnosis Date  . Abnormal finding on MRI of brain   . Breast cancer of upper-inner quadrant of left female breast ([place]) 10/12/2015  . CAD (coronary artery disease)   . Chronic venous insufficiency    Compression stockings, Dr. Carmine Savoy to evaluate late May 2015   . Degeneration of lumbar or lumbosacral intervertebral disc   . History of thyroid cancer    With reported brain mets.   . Hyperlipidemia   . Hypothyroid    Outside records report synthroid 133mg despite her report of 1048m. 02/2014 awaiting clarification   . Idiopathic scoliosis   . Osteoporosis    July 2014 DEXA   . PNEUMONIA, COMMUNITY ACQUIRED, PNEUMOCOCCAL   . [place]    Past Surgical History:  Procedure Laterality Date  . BREAST LUMPECTOMY WITH RADIOACTIVE SEED LOCALIZATION Left 03/29/2016   Procedure: LEFT BREAST LUMPECTOMY WITH RADIOACTIVE SEED LOCALIZATION;  Surgeon: PaAutumn MessingII, MD;  Location: [facility];  Service: General;  Laterality: Left;  LEFT BREAST LUMPECTOMY WITH RADIOACTIVE SEED LOCALIZATION  . cranotomy    . THYROIDECTOMY      Family History  Problem Relation Age of Onset  . CAD Sister   . Cancer Sister   . Heart disease Sister   . Diabetes Father   . COPD Father     Social History   Socioeconomic History  . Marital status: Widowed     Spouse name: Not on file  . Number of children: 2  . Years of education: Not on file  . Highest education level: Not on file  Occupational History    Employer: RETIRED  Tobacco Use  . Smoking status: Never Smoker  . Smokeless tobacco: Never Used  Substance and Sexual Activity  . Alcohol use: Yes    Alcohol/week: 0.0 standard drinks    Comment: Occasional  . Drug use: No  . Sexual activity: Not on file  Other Topics Concern  . Not on file  Social History Narrative  . Not on file   Social Determinants of Health   Financial Resource Strain:   . Difficulty of Paying Living Expenses:   Food Insecurity:   . Worried About Charity fundraiser in the Last Year:   . Arboriculturist in the Last Year:   Transportation Needs:   . Film/video editor (Medical):   Marland Kitchen Lack of Transportation (Non-Medical):   Physical Activity:   . Days of Exercise per Week:   . Minutes of Exercise per Session:   Stress:   . Feeling of Stress :   Social Connections:   . Frequency of Communication with Friends and Family:   . Frequency of Social Gatherings with Friends and Family:   . Attends Religious Services:   . Active Member of Clubs or Organizations:   . Attends Archivist Meetings:   Marland Kitchen Marital Status:   Intimate Partner Violence:   . Fear of Current or Ex-Partner:   . Emotionally Abused:   Marland Kitchen Physically Abused:   . Sexually Abused:     Outpatient Medications Prior to Visit  Medication Sig Dispense Refill  . aspirin 81 MG tablet Take 81 mg by mouth daily.    . cephALEXin (KEFLEX) 250 MG capsule     . letrozole (FEMARA) 2.5 MG tablet TAKE 1 TABLET(2.5 MG) BY MOUTH DAILY 90 tablet 0  . metoprolol succinate (TOPROL-XL) 25 MG 24 hr tablet Take 1/2 tab (12.68m) by mouth every other day. 60 tablet 1  . Omega-3 Fatty Acids (FISH OIL) 1000 MG CAPS Take by mouth.    . SYNTHROID 150 MCG tablet TAKE 1 TABLET(150 MCG) BY MOUTH DAILY 30 tablet 0   No facility-administered medications prior  to visit.    Allergies  Allergen Reactions  . Neomy-Bacit-Polymyx-Pramoxine Itching  . Amoxicillin Hives and Other (See Comments)  . Colesevelam Hcl Other (See Comments)  . Doxycycline Hives and Other (See Comments)  . Levaquin  [Levofloxacin In D5w] Other (See Comments)  . Levofloxacin Other (See Comments)  . Other Itching and Rash    curad triple antibiotic ointment. Curad Triple Antibiotic Ointment  . Statins Hives and Other (See Comments)    Hives Other reaction(s): unknown  . Colesevelam Hives  . Neomycin Hives    Allergic reaction only topically. Only topically  . Red Dye Hives and Itching    Red frosting caused itching and facial swelling  Patient is uncertain this  reaction occurred (02/21/18) currently eats foods with red dye  . Tape Hives and Itching    ROS Review of Systems  Constitutional: Positive for fatigue. Negative for activity change, appetite change, chills, fever and unexpected weight change.  Respiratory: Negative for cough, chest tightness and shortness of breath.   Cardiovascular: Positive for leg swelling. Negative for chest pain and palpitations.  Gastrointestinal: Positive for constipation. Negative for abdominal distention, diarrhea, nausea and vomiting.       Occasional constipation- baseline  Endocrine: Negative for cold intolerance and heat intolerance.  Musculoskeletal: Positive for joint swelling.  Skin: Positive for wound.  Neurological: Negative for dizziness, tremors, weakness, light-headedness and headaches.  Psychiatric/Behavioral: Negative for agitation, confusion, decreased concentration and sleep disturbance. The patient is nervous/anxious.       Objective:    Physical Exam  Constitutional: She is oriented to person, place, and time. Vital signs are normal.  HENT:  Head: Normocephalic.  Eyes: Pupils are equal, round, and reactive to light. Conjunctivae and EOM are normal.  Neck: No JVD present. No thyromegaly present.    Cardiovascular: Normal rate, regular rhythm and normal heart sounds.  Pulmonary/Chest: Effort normal and breath sounds normal.  Abdominal: Soft. Bowel sounds are normal. She exhibits no distension.  Musculoskeletal:        General: Tenderness, deformity and edema present.     Cervical back: Normal range of motion and neck supple.  Lymphadenopathy:    She has no cervical adenopathy.  Neurological: She is alert and oriented to person, place, and time. Coordination normal.  Skin: Skin is warm, dry and intact.     Psychiatric: Her behavior is normal. Judgment and thought content normal. Her mood appears anxious. Her speech is rapid and/or pressured. Cognition and memory are normal.  Nursing note and vitals reviewed.   There were no vitals taken for this visit. Wt Readings from Last 3 Encounters:  11/06/19 127 lb 1.9 oz (57.7 kg)  10/06/19 128 lb 1.9 oz (58.1 kg)  07/21/19 123 lb 1.9 oz (55.8 kg)     Health Maintenance Due  Topic Date Due  . COVID-19 Vaccine (1) Never done    There are no preventive care reminders to display for this patient.  Lab Results  Component Value Date   TSH 11.58 (H) 10/06/2019   Lab Results  Component Value Date   WBC 5.6 10/06/2019   HGB 11.7 10/06/2019   HCT 35.2 10/06/2019   MCV 93.1 10/06/2019   PLT 199 10/06/2019   Lab Results  Component Value Date   NA 139 07/21/2019   K 4.3 07/21/2019   CHLORIDE 104 03/13/2017   CO2 29 07/21/2019   GLUCOSE 83 07/21/2019   BUN 26 (H) 07/21/2019   CREATININE 1.14 (H) 07/21/2019   BILITOT 0.4 07/21/2019   ALKPHOS 66 07/21/2019   AST 17 07/21/2019   ALT 10 07/21/2019   PROT 6.7 07/21/2019   ALBUMIN 4.3 07/21/2019   CALCIUM 8.8 (L) 07/21/2019   ANIONGAP 7 07/21/2019   EGFR 62 (L) 03/13/2017   GFR 58.56 (L) 11/23/2014   Lab Results  Component Value Date   CHOL 251 (H) 08/03/2017   Lab Results  Component Value Date   HDL 59 08/03/2017   Lab Results  Component Value Date   LDLCALC 162 (H)  08/03/2017   Lab Results  Component Value Date   TRIG 154 (H) 08/03/2017   Lab Results  Component Value Date   CHOLHDL 4.3 08/03/2017   Lab Results  Component Value Date   HGBA1C 5.5 01/29/2017      Assessment & Plan:   1. Hypothyroidism, postsurgical She is still struggling to take her medication at the same time every day, but appears to be doing a little better with getting a dose in daily. She struggles with OCD and is very resistant to changing the time she takes her medication. I would really like for her to have a more consistent schedule for her overall wellbeing, but I am not sure that we will be able to do this while she is independently managing her own medications.  We will monitor her TSH today and determine if any changes need to be made to her medication doses. I encouraged her to consider taking her medication before dinner, once again, in an effort to find a schedule that she can better stick to.  Her anxiety was definitely elevated today, however, she is feeling a significant amount of pressure to get things together for an upcoming trip as well as frequent social events this week. Her OCD plays a large role in this, I feel, and I am not sure that we will see much improvement with better controlled thyroid levels.  Plan to monitor TSH today and will make changes to medication dosages as necessary based on results.  Plan to follow-up in about 3 months if levels are appropriate.  Once again encouraged patient to take her medication at the same time every day, preferably before she goes down for the night to help her keep a regular schedule.  - TSH - SYNTHROID 150 MCG tablet; TAKE 1 TABLET(150 MCG) BY MOUTH DAILY  Dispense: 90 tablet; Refill: 1  Return in about 3 months (around 03/30/2020) for Thyroid.   Orma Render, NP

## 2019-12-30 LAB — TSH: TSH: 1.65 mIU/L (ref 0.40–4.50)

## 2019-12-30 NOTE — Progress Notes (Signed)
Her TSH is within normal range! She can continue to take the medication as she has been. It doesn't look like the time changes in her doses are affecting her levels too much, so we can plan to keep things as they are and we will recheck in about 3 months when she comes back in for her follow-up.

## 2020-01-01 ENCOUNTER — Other Ambulatory Visit: Payer: Self-pay

## 2020-01-01 DIAGNOSIS — E890 Postprocedural hypothyroidism: Secondary | ICD-10-CM

## 2020-01-20 ENCOUNTER — Other Ambulatory Visit: Payer: Medicare Other

## 2020-01-20 ENCOUNTER — Ambulatory Visit: Payer: Medicare Other | Admitting: Family

## 2020-01-23 ENCOUNTER — Inpatient Hospital Stay: Payer: Medicare Other | Attending: Hematology & Oncology

## 2020-01-23 ENCOUNTER — Inpatient Hospital Stay: Payer: Medicare Other | Admitting: Hematology & Oncology

## 2020-02-18 ENCOUNTER — Telehealth: Payer: Self-pay | Admitting: Hematology & Oncology

## 2020-02-18 ENCOUNTER — Telehealth: Payer: Self-pay

## 2020-02-18 ENCOUNTER — Other Ambulatory Visit: Payer: Self-pay

## 2020-02-18 NOTE — Telephone Encounter (Signed)
Patient left voicemail wanting to talk to someone about incontinence issues. Returned call to patient, left voicemail for patient to return call to the office.

## 2020-02-18 NOTE — Telephone Encounter (Signed)
Called and advised patient of appointments added per 6/29 sch msg

## 2020-02-19 ENCOUNTER — Ambulatory Visit: Payer: Medicare Other | Admitting: Hematology & Oncology

## 2020-02-19 ENCOUNTER — Other Ambulatory Visit: Payer: Medicare Other

## 2020-02-20 ENCOUNTER — Ambulatory Visit: Payer: Medicare Other | Admitting: Nurse Practitioner

## 2020-02-24 ENCOUNTER — Ambulatory Visit (INDEPENDENT_AMBULATORY_CARE_PROVIDER_SITE_OTHER): Payer: Medicare Other | Admitting: Nurse Practitioner

## 2020-02-24 ENCOUNTER — Encounter: Payer: Self-pay | Admitting: Nurse Practitioner

## 2020-02-24 VITALS — BP 110/78 | HR 68 | Temp 97.8°F | Ht 67.0 in | Wt 117.9 lb

## 2020-02-24 DIAGNOSIS — E890 Postprocedural hypothyroidism: Secondary | ICD-10-CM | POA: Diagnosis not present

## 2020-02-24 DIAGNOSIS — I10 Essential (primary) hypertension: Secondary | ICD-10-CM

## 2020-02-24 DIAGNOSIS — N3941 Urge incontinence: Secondary | ICD-10-CM

## 2020-02-24 DIAGNOSIS — R252 Cramp and spasm: Secondary | ICD-10-CM

## 2020-02-24 DIAGNOSIS — Z0000 Encounter for general adult medical examination without abnormal findings: Secondary | ICD-10-CM

## 2020-02-24 LAB — POCT URINALYSIS DIP (CLINITEK)
Bilirubin, UA: NEGATIVE
Blood, UA: NEGATIVE
Glucose, UA: NEGATIVE mg/dL
Ketones, POC UA: NEGATIVE mg/dL
Leukocytes, UA: NEGATIVE
Nitrite, UA: NEGATIVE
POC PROTEIN,UA: NEGATIVE
Spec Grav, UA: 1.025 (ref 1.010–1.025)
Urobilinogen, UA: 0.2 E.U./dL
pH, UA: 5 (ref 5.0–8.0)

## 2020-02-24 NOTE — Patient Instructions (Addendum)
Thank you for allowing me to provide care for you today.   I will put in a referral to urology as close as I can get to [facility].    Please read the following information completely and let us know if you have any questions or concerns.   Preventive Care 84 Years and Older, Female Preventive care refers to lifestyle choices and visits with your health care provider that can promote health and wellness. This includes:  A yearly physical exam. This is also called an annual well check.  Regular dental and eye exams.  Immunizations.  Screening for certain conditions.  Healthy lifestyle choices, such as diet and exercise. What can I expect for my preventive care visit? Physical exam Your health care provider will check:  Height and weight. These may be used to calculate body mass index (BMI), which is a measurement that tells if you are at a healthy weight.  Heart rate and blood pressure.  Your skin for abnormal spots. Counseling Your health care provider may ask you questions about:  Alcohol, tobacco, and drug use.  Emotional well-being.  Home and relationship well-being.  Sexual activity.  Eating habits.  History of falls.  Memory and ability to understand (cognition).  Work and work Statistician.  Pregnancy and menstrual history. What immunizations do I need?  Influenza (flu) vaccine  This is recommended every year. Tetanus, diphtheria, and pertussis (Tdap) vaccine  You may need a Td booster every 10 years. Varicella (chickenpox) vaccine  You may need this vaccine if you have not already been vaccinated. Zoster (shingles) vaccine  You may need this after age 84. Pneumococcal conjugate (PCV13) vaccine  One dose is recommended after age 84. Pneumococcal polysaccharide (PPSV23) vaccine  One dose is recommended after age 84. Measles, mumps, and rubella (MMR) vaccine  You may need at least one dose of MMR if you were born in 1957 or later. You may  also need a second dose. Meningococcal conjugate (MenACWY) vaccine  You may need this if you have certain conditions. Hepatitis A vaccine  You may need this if you have certain conditions or if you travel or work in places where you may be exposed to hepatitis A. Hepatitis B vaccine  You may need this if you have certain conditions or if you travel or work in places where you may be exposed to hepatitis B. Haemophilus influenzae type b (Hib) vaccine  You may need this if you have certain conditions. You may receive vaccines as individual doses or as more than one vaccine together in one shot (combination vaccines). Talk with your health care provider about the risks and benefits of combination vaccines. What tests do I need? Blood tests  Lipid and cholesterol levels. These may be checked every 5 years, or more frequently depending on your overall health.  Hepatitis C test.  Hepatitis B test. Screening  Lung cancer screening. You may have this screening every year starting at age 84 if you have a 30-pack-year history of smoking and currently smoke or have quit within the past 15 years.  Colorectal cancer screening. All adults should have this screening starting at age 84 and continuing until age 84. Your health care provider may recommend screening at age 84 if you are at increased risk. You will have tests every 1-10 years, depending on your results and the type of screening test.  Diabetes screening. This is done by checking your blood sugar (glucose) after you have not eaten for a while (fasting). You  may have this done every 1-3 years.  Mammogram. This may be done every 84-2 years. Talk with your health care provider about how often you should have regular mammograms.  BRCA-related cancer screening. This may be done if you have a family history of breast, ovarian, tubal, or peritoneal cancers. Other tests  Sexually transmitted disease (STD) testing.  Bone density scan. This is  done to screen for osteoporosis. You may have this done starting at age 84. Follow these instructions at home: Eating and drinking  Eat a diet that includes fresh fruits and vegetables, whole grains, lean protein, and low-fat dairy products. Limit your intake of foods with high amounts of sugar, saturated fats, and salt.  Take vitamin and mineral supplements as recommended by your health care provider.  Do not drink alcohol if your health care provider tells you not to drink.  If you drink alcohol: ? Limit how much you have to 0-1 drink a day. ? Be aware of how much alcohol is in your drink. In the [place] one drink equals one 12 oz bottle of beer (355 mL), one 5 oz glass of wine (148 mL), or one 1 oz glass of hard liquor (44 mL). Lifestyle  Take daily care of your teeth and gums.  Stay active. Exercise for at least 30 minutes on 5 or more days each week.  Do not use any products that contain nicotine or tobacco, such as cigarettes, e-cigarettes, and chewing tobacco. If you need help quitting, ask your health care provider.  If you are sexually active, practice safe sex. Use a condom or other form of protection in order to prevent STIs (sexually transmitted infections).  Talk with your health care provider about taking a low-dose aspirin or statin. What's next?  Go to your health care provider once a year for a well check visit.  Ask your health care provider how often you should have your eyes and teeth checked.  Stay up to date on all vaccines. This information is not intended to replace advice given to you by your health care provider. Make sure you discuss any questions you have with your health care provider. Document Revised: 08/01/2018 Document Reviewed: 08/01/2018 Elsevier Patient Education  2020 [employer].

## 2020-02-24 NOTE — Progress Notes (Signed)
Established Patient Office Visit  Subjective:  Patient ID: Kristy Olson, female    DOB: 01-21-1932  Age: 86 y.o. MRN: [id number]  CC:  Chief Complaint  Patient presents with  . Referral    requesting referral to Urologist  . Dysuria    HPI Kristy Olson presents for annual physical examination. She is a resident of [facility] and lives independently in the retirement community. She has her meals prepared in the dining facility and reports a well balanced diet. She manages her own medications and still drives herself to most appointments, but will occasionally rely on family or the concierge service with the retirement community to help her. She performs all of her own ADL's independently.   She reports that she is continuing to have pain in her left leg due to a non-healing ulcer that is managed by the wound care center. She reports the pain is worst at night when she is trying to go to sleep. She sees wound care regularly for this. She reports that she does not take pain medication for this often due to the pain coming on once she has gone to bed.   She reports increased stress recently due to the suspected failing marriage of her son. She states that her granddaughters reached out to her, but her son has not come forward to tell her anything specific. She finds herself thinking about this often and feels that she needs to do something to help. She states that her "heart is broken" over the thought of the marriage failing.   She states that she is taking her thyroid medication as prescribed in the evenings. She has only missed a few doses of medication and feels she is doing well. She denies any side effects today.   She does reports that intermittently her fingers and toes will become "frozen" and she is unable to move the joint. She states this happens most often in her toes at night when she is wiggling them or in her hands when she is brushing her teeth or using her hands more often. She  thought that improved control over her thyroid would help, but this has not helped. She reports this is ongoing for years, but seems to be getting worse. She has a family member with a condition in his joints that causes them to freeze up and she is concerned this could be happening to her.  She also reports ongoing issues with urge incontinence. She is now wearing a pad all day and all night due to increased accidents and inability to get to the bathroom quick enough. She states that the urge is sudden and she has no warning for urination. She would like a referral to urology to see if this can be evaluated.     Past Medical History:  Diagnosis Date  . Abnormal finding on MRI of brain   . 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 CAD (coronary artery disease)   . Chronic venous insufficiency    Compression stockings, Dr. Carmine Savoy to evaluate late May 2015   . Degeneration of lumbar or lumbosacral intervertebral disc   . History of thyroid cancer    With reported brain mets.   . Hyperlipidemia   . Hypothyroid    Outside records report synthroid 160mg despite her report of 1039m. 02/2014 awaiting clarification   . Idiopathic scoliosis   . Osteoporosis    July 2014 DEXA   . PNEUMONIA, COMMUNITY ACQUIRED, PNEUMOCOCCAL   .  Venous stasis ulcer ([place])     Past Surgical History:  Procedure Laterality Date  . BREAST LUMPECTOMY WITH RADIOACTIVE SEED LOCALIZATION Left 03/29/2016   Procedure: LEFT BREAST LUMPECTOMY WITH RADIOACTIVE SEED LOCALIZATION;  Surgeon: Autumn Messing III, MD;  Location: [facility];  Service: General;  Laterality: Left;  LEFT BREAST LUMPECTOMY WITH RADIOACTIVE SEED LOCALIZATION  . cranotomy    . THYROIDECTOMY      Family History  Problem Relation Age of Onset  . CAD Sister   . Cancer Sister   . Heart disease Sister   . Diabetes Father   . COPD Father     Social History   Socioeconomic History  . Marital status:  Widowed    Spouse name: Not on file  . Number of children: 2  . Years of education: Not on file  . Highest education level: Not on file  Occupational History    Employer: RETIRED  Tobacco Use  . Smoking status: Never Smoker  . Smokeless tobacco: Never Used  Vaping Use  . Vaping Use: Never used  Substance and Sexual Activity  . Alcohol use: Yes    Alcohol/week: 0.0 standard drinks    Comment: Occasional  . Drug use: No  . Sexual activity: Not on file  Other Topics Concern  . Not on file  Social History Narrative  . Not on file   Social Determinants of Health   Financial Resource Strain:   . Difficulty of Paying Living Expenses:   Food Insecurity:   . Worried About Charity fundraiser in the Last Year:   . Arboriculturist in the Last Year:   Transportation Needs:   . Film/video editor (Medical):   Marland Kitchen Lack of Transportation (Non-Medical):   Physical Activity:   . Days of Exercise per Week:   . Minutes of Exercise per Session:   Stress:   . Feeling of Stress :   Social Connections:   . Frequency of Communication with Friends and Family:   . Frequency of Social Gatherings with Friends and Family:   . Attends Religious Services:   . Active Member of Clubs or Organizations:   . Attends Archivist Meetings:   Marland Kitchen Marital Status:   Intimate Partner Violence:   . Fear of Current or Ex-Partner:   . Emotionally Abused:   Marland Kitchen Physically Abused:   . Sexually Abused:     Outpatient Medications Prior to Visit  Medication Sig Dispense Refill  . aspirin 81 MG tablet Take 81 mg by mouth daily.    Marland Kitchen ipratropium (ATROVENT) 0.06 % nasal spray Place into both nostrils daily.    Marland Kitchen letrozole (FEMARA) 2.5 MG tablet TAKE 1 TABLET(2.5 MG) BY MOUTH DAILY 90 tablet 0  . metoprolol succinate (TOPROL-XL) 25 MG 24 hr tablet Take 1/2 tab (12.58m) by mouth every other day. 60 tablet 1  . SYNTHROID 150 MCG tablet TAKE 1 TABLET(150 MCG) BY MOUTH DAILY 90 tablet 1   No  facility-administered medications prior to visit.    Allergies  Allergen Reactions  . Neomy-Bacit-Polymyx-Pramoxine Itching  . Amoxicillin Hives and Other (See Comments)  . Colesevelam Hcl Other (See Comments)  . Doxycycline Hives and Other (See Comments)  . Levaquin  [Levofloxacin In D5w] Other (See Comments)  . Levofloxacin Other (See Comments)  . Other Itching and Rash    curad triple antibiotic ointment. Curad Triple Antibiotic Ointment  . Statins Hives and Other (See Comments)    Hives Other  reaction(s): unknown  . Colesevelam Hives  . Neomycin Hives    Allergic reaction only topically. Only topically  . Red Dye Hives and Itching    Red frosting caused itching and facial swelling  Patient is uncertain this reaction occurred (02/21/18) currently eats foods with red dye  . Tape Hives and Itching       Objective:    Physical Exam Vitals and nursing note reviewed.  Constitutional:      Appearance: She is underweight.  HENT:     Head: Atraumatic.     Right Ear: Tympanic membrane normal.     Left Ear: Tympanic membrane normal.     Nose: Nose normal.     Mouth/Throat:     Mouth: Mucous membranes are moist.     Pharynx: Oropharynx is clear.  Eyes:     Extraocular Movements: Extraocular movements intact.     Conjunctiva/sclera: Conjunctivae normal.     Pupils: Pupils are equal, round, and reactive to light.     Funduscopic exam:    Right eye: No hemorrhage or papilledema. Red reflex present.        Left eye: No hemorrhage or papilledema. Red reflex present. Neck:     Thyroid: No thyromegaly or thyroid tenderness.     Vascular: No carotid bruit.  Cardiovascular:     Rate and Rhythm: Normal rate and regular rhythm.     Pulses: Normal pulses.          Posterior tibial pulses are 2+ on the right side.     Heart sounds: Normal heart sounds. No murmur heard.   Pulmonary:     Effort: Pulmonary effort is normal.     Breath sounds: Normal breath sounds. No wheezing.    Abdominal:     General: Abdomen is flat. Bowel sounds are normal. There is no distension.     Palpations: Abdomen is soft.     Tenderness: There is no abdominal tenderness. There is no right CVA tenderness or left CVA tenderness.  Musculoskeletal:        General: Signs of injury present.     Cervical back: Normal range of motion and neck supple. No tenderness.     Left lower leg: Edema present.     Left foot: Decreased range of motion.       Feet:     Comments: Upper and lower extremity weakness, most likely related to age and associated changes. Decreased muscle tone throughout body.  Arthritic changes apparent to fingers bilaterally and toes of the right foot (unable to visualize left foot due to [employer]).  Lymphadenopathy:     Cervical: No cervical adenopathy.  Skin:    General: Skin is warm and dry.     Capillary Refill: Capillary refill takes less than 2 seconds.     Coloration: Skin is pale.  Neurological:     General: No focal deficit present.     Mental Status: She is alert and oriented to person, place, and time.     Cranial Nerves: Cranial nerves are intact.     Sensory: Sensation is intact.     Motor: Weakness and atrophy present.     Coordination: Coordination is intact.     Gait: Gait is intact.     Deep Tendon Reflexes: Reflexes are normal and symmetric.     Comments: No sensation deficit noted in hands or right foot (unable to assess left foot due to the presence of una boot).   Psychiatric:  Attention and Perception: Attention normal.        Mood and Affect: Affect is tearful.        Speech: Speech normal.        Behavior: Behavior normal. Behavior is cooperative.        Thought Content: Thought content normal.        Cognition and Memory: She exhibits impaired recent memory.        Judgment: Judgment normal.     Comments: She is tearful and sad when discussing the situation with her son.  She needs additional time to gather her thoughts and to answer  questions. She has some difficulty with recall of certain words or describing certain situations, but nothing overly concerning at that time.      BP 110/78   Pulse 68   Temp 97.8 F (36.6 C) (Oral)   Ht 5' 7"  (1.702 m)   Wt 117 lb 14.4 oz (53.5 kg)   SpO2 96%   BMI 18.47 kg/m  Wt Readings from Last 3 Encounters:  02/24/20 117 lb 14.4 oz (53.5 kg)  12/29/19 121 lb (54.9 kg)  11/06/19 127 lb 1.9 oz (57.7 kg)     There are no preventive care reminders to display for this patient.  There are no preventive care reminders to display for this patient.  Lab Results  Component Value Date   TSH 1.65 12/29/2019   Lab Results  Component Value Date   WBC 5.6 10/06/2019   HGB 11.7 10/06/2019   HCT 35.2 10/06/2019   MCV 93.1 10/06/2019   PLT 199 10/06/2019   Lab Results  Component Value Date   NA 139 07/21/2019   K 4.3 07/21/2019   CHLORIDE 104 03/13/2017   CO2 29 07/21/2019   GLUCOSE 83 07/21/2019   BUN 26 (H) 07/21/2019   CREATININE 1.14 (H) 07/21/2019   BILITOT 0.4 07/21/2019   ALKPHOS 66 07/21/2019   AST 17 07/21/2019   ALT 10 07/21/2019   PROT 6.7 07/21/2019   ALBUMIN 4.3 07/21/2019   CALCIUM 8.8 (L) 07/21/2019   ANIONGAP 7 07/21/2019   EGFR 62 (L) 03/13/2017   GFR 58.56 (L) 11/23/2014   Lab Results  Component Value Date   CHOL 251 (H) 08/03/2017   Lab Results  Component Value Date   HDL 59 08/03/2017   Lab Results  Component Value Date   LDLCALC 162 (H) 08/03/2017   Lab Results  Component Value Date   TRIG 154 (H) 08/03/2017   Lab Results  Component Value Date   CHOLHDL 4.3 08/03/2017   Lab Results  Component Value Date   HGBA1C 5.5 01/29/2017      Assessment & Plan:  1. Encounter for annual physical exam Annual physical exam for 86 year old female. Will obtain labs today. She is not due for any specific health maintenance activities. Her BMI is very low and that is concerning, however, she has very little muscle mass present. Will continue  to monitor her weight. It may be beneficial to consider adding a protein drink such as ensure to her diet to utilize in between meals. Will obtain labs today.   PLAN: - COMPLETE METABOLIC PANEL WITH GFR - CBC with Differential - If weight declines, consider adding high protein supplement for in between meals.   2. Essential hypertension, benign Well controlled hypertension with 12.58m of metoprolol daily. Will obtain labs today to evaluate kidney, liver, electrolyte, and blood counts. Continue current medication as prescribed. No refills needed today.  PLAN: - COMPLETE METABOLIC PANEL WITH GFR - CBC with Differential -Continue metoprolol 12.71m  3. Hypothyroidism, postsurgical Hypothyroidism historically not well controlled due to difficulty remembering medication. Her last check showed much improved numbers, therefore I am hopeful that she has found a regimen that works well for her. We will recheck her TSH today to make sure that things area looking good.   PLAN: -Continue levothyroxine as prescribed.  - TSH  4. Urgency incontinence Symptoms and presentation consistent with urge incontinence. Her symptoms are worsening and this does cause concern with her risk of falling given her age and injury to her left lower extremity that limits her ability to move freely and quickly. She would most certainly benefit from evaluation with urology in the event that a pessary device could be used to help with her symptoms. UA negative in the office today.   PLAN: - Referral to urology- patient believes she has seen urology in the last year and is going to check paperwork at home and then get back to me.  - POCT URINALYSIS DIP (CLINITEK)  5. Muscle cramp, nocturnal Muscle cramping of the fingers and toes, mostly at night, of unknown etiology. She definitely has arthritic appearing joints in her hands and feet and her symptoms could be an exacerbation of arthritis that she most notices at night. We  will check labs today to determine if there could be an electrolyte imbalance contributing to her symptoms.   PLAN: - Monitor CMP and Magnesium labs - May consider trial of low dose NSAID if her labs are normal and kidney function is good.   Return in about 1 year (around 02/23/2021).   SOrma Render NP

## 2020-02-25 LAB — COMPLETE METABOLIC PANEL WITH GFR
AG Ratio: 1.6 (calc) (ref 1.0–2.5)
ALT: 8 U/L (ref 6–29)
AST: 15 U/L (ref 10–35)
Albumin: 4.1 g/dL (ref 3.6–5.1)
Alkaline phosphatase (APISO): 69 U/L (ref 37–153)
BUN/Creatinine Ratio: 23 (calc) — ABNORMAL HIGH (ref 6–22)
BUN: 22 mg/dL (ref 7–25)
CO2: 29 mmol/L (ref 20–32)
Calcium: 8.9 mg/dL (ref 8.6–10.4)
Chloride: 102 mmol/L (ref 98–110)
Creat: 0.95 mg/dL — ABNORMAL HIGH (ref 0.60–0.88)
GFR, Est African American: 63 mL/min/{1.73_m2} (ref >=60)
GFR, Est Non African American: 54 mL/min/{1.73_m2} — ABNORMAL LOW (ref >=60)
Globulin: 2.5 g/dL (calc) (ref 1.9–3.7)
Glucose, Bld: 78 mg/dL (ref 65–139)
Potassium: 4.6 mmol/L (ref 3.5–5.3)
Sodium: 138 mmol/L (ref 135–146)
Total Bilirubin: 0.5 mg/dL (ref 0.2–1.2)
Total Protein: 6.6 g/dL (ref 6.1–8.1)

## 2020-02-25 LAB — CBC WITH DIFFERENTIAL/PLATELET
Absolute Monocytes: 569 cells/uL (ref 200–950)
Basophils Absolute: 50 cells/uL (ref 0–200)
Basophils Relative: 0.7 %
Eosinophils Absolute: 108 cells/uL (ref 15–500)
Eosinophils Relative: 1.5 %
HCT: 35.5 % (ref 35.0–45.0)
Hemoglobin: 11.6 g/dL — ABNORMAL LOW (ref 11.7–15.5)
Lymphs Abs: 1274 cells/uL (ref 850–3900)
MCH: 29.8 pg (ref 27.0–33.0)
MCHC: 32.7 g/dL (ref 32.0–36.0)
MCV: 91.3 fL (ref 80.0–100.0)
MPV: 11.2 fL (ref 7.5–12.5)
Monocytes Relative: 7.9 %
Neutro Abs: 5198 cells/uL (ref 1500–7800)
Neutrophils Relative %: 72.2 %
Platelets: 216 10*3/uL (ref 140–400)
RBC: 3.89 10*6/uL (ref 3.80–5.10)
RDW: 12.7 % (ref 11.0–15.0)
Total Lymphocyte: 17.7 %
WBC: 7.2 10*3/uL (ref 3.8–10.8)

## 2020-02-25 LAB — TSH: TSH: 1.11 mIU/L (ref 0.40–4.50)

## 2020-02-25 LAB — MAGNESIUM: Magnesium: 2.1 mg/dL (ref 1.5–2.5)

## 2020-02-25 NOTE — Progress Notes (Signed)
TSH still looks good! Great job taking your medication.   Your electrolytes were all normal. It is quite common to experience muscle cramps and spasms in the hands and feet, especially as we get older. There are many causes, sometimes it can be from your thyroid not being well controlled or from electrolyte (like sodium and potassium) imbalance, but dehydration is a big cause. How much water are you drinking a day? It is very important to drink at least 8 8 ounces glasses of water every day. This does not include tea, coffee, or soda- all of this have caffeine which works as a diuretic and can actually make you loose more water.   Your kidney function is slightly decreased, but this is actually better than it has been in the past. We will keep an eye on this about every 6 months because this can affect how your medicine works in your body.   Your blood counts look pretty good. You are slightly anemic (iron low), but this is better than it has been in the past, also. We will keep an eye on this to make sure it does start trending down.   Let's try to drink extra water and see if that helps with the cramping in your toes and fingers.

## 2020-03-05 ENCOUNTER — Other Ambulatory Visit: Payer: Self-pay | Admitting: Osteopathic Medicine

## 2020-03-05 DIAGNOSIS — I2510 Atherosclerotic heart disease of native coronary artery without angina pectoris: Secondary | ICD-10-CM

## 2020-03-21 ENCOUNTER — Other Ambulatory Visit: Payer: Self-pay | Admitting: Hematology & Oncology

## 2020-03-21 DIAGNOSIS — C73 Malignant neoplasm of thyroid gland: Secondary | ICD-10-CM

## 2020-03-21 DIAGNOSIS — C50112 Malignant neoplasm of central portion of left female breast: Secondary | ICD-10-CM

## 2020-03-25 ENCOUNTER — Other Ambulatory Visit: Payer: Self-pay

## 2020-03-25 ENCOUNTER — Inpatient Hospital Stay: Payer: Medicare Other | Attending: Hematology & Oncology

## 2020-03-25 ENCOUNTER — Inpatient Hospital Stay (HOSPITAL_BASED_OUTPATIENT_CLINIC_OR_DEPARTMENT_OTHER): Payer: Medicare Other | Admitting: Family

## 2020-03-25 ENCOUNTER — Telehealth: Payer: Self-pay | Admitting: Family

## 2020-03-25 ENCOUNTER — Encounter: Payer: Self-pay | Admitting: Family

## 2020-03-25 VITALS — BP 162/77 | HR 103 | Temp 98.4°F | Resp 19 | Wt 112.0 lb

## 2020-03-25 DIAGNOSIS — R232 Flushing: Secondary | ICD-10-CM | POA: Insufficient documentation

## 2020-03-25 DIAGNOSIS — Z79811 Long term (current) use of aromatase inhibitors: Secondary | ICD-10-CM | POA: Insufficient documentation

## 2020-03-25 DIAGNOSIS — Z79899 Other long term (current) drug therapy: Secondary | ICD-10-CM | POA: Diagnosis not present

## 2020-03-25 DIAGNOSIS — L97429 Non-pressure chronic ulcer of left heel and midfoot with unspecified severity: Secondary | ICD-10-CM | POA: Diagnosis not present

## 2020-03-25 DIAGNOSIS — C50212 Malignant neoplasm of upper-inner quadrant of left female breast: Secondary | ICD-10-CM

## 2020-03-25 DIAGNOSIS — Z170 Estrogen receptor positive status [ER+]: Secondary | ICD-10-CM | POA: Diagnosis not present

## 2020-03-25 DIAGNOSIS — C50912 Malignant neoplasm of unspecified site of left female breast: Secondary | ICD-10-CM | POA: Insufficient documentation

## 2020-03-25 LAB — CMP (CANCER CENTER ONLY)
ALT: 6 U/L (ref 0–44)
AST: 14 U/L — ABNORMAL LOW (ref 15–41)
Albumin: 4.1 g/dL (ref 3.5–5.0)
Alkaline Phosphatase: 57 U/L (ref 38–126)
Anion gap: 7 (ref 5–15)
BUN: 23 mg/dL (ref 8–23)
CO2: 30 mmol/L (ref 22–32)
Calcium: 9.3 mg/dL (ref 8.9–10.3)
Chloride: 102 mmol/L (ref 98–111)
Creatinine: 0.91 mg/dL (ref 0.44–1.00)
GFR, Est AFR Am: >60 mL/min (ref >60)
GFR, Estimated: 57 mL/min — ABNORMAL LOW (ref >60)
Glucose, Bld: 98 mg/dL (ref 70–99)
Potassium: 4.2 mmol/L (ref 3.5–5.1)
Sodium: 139 mmol/L (ref 135–145)
Total Bilirubin: 0.5 mg/dL (ref 0.3–1.2)
Total Protein: 6.8 g/dL (ref 6.5–8.1)

## 2020-03-25 LAB — CBC WITH DIFFERENTIAL (CANCER CENTER ONLY)
Abs Immature Granulocytes: 0.02 10*3/uL (ref 0.00–0.07)
Basophils Absolute: 0 10*3/uL (ref 0.0–0.1)
Basophils Relative: 0 %
Eosinophils Absolute: 0.1 10*3/uL (ref 0.0–0.5)
Eosinophils Relative: 2 %
HCT: 31.7 % — ABNORMAL LOW (ref 36.0–46.0)
Hemoglobin: 10.3 g/dL — ABNORMAL LOW (ref 12.0–15.0)
Immature Granulocytes: 0 %
Lymphocytes Relative: 27 %
Lymphs Abs: 1.4 10*3/uL (ref 0.7–4.0)
MCH: 30 pg (ref 26.0–34.0)
MCHC: 32.5 g/dL (ref 30.0–36.0)
MCV: 92.4 fL (ref 80.0–100.0)
Monocytes Absolute: 0.4 10*3/uL (ref 0.1–1.0)
Monocytes Relative: 9 %
Neutro Abs: 3 10*3/uL (ref 1.7–7.7)
Neutrophils Relative %: 62 %
Platelet Count: 207 10*3/uL (ref 150–400)
RBC: 3.43 MIL/uL — ABNORMAL LOW (ref 3.87–5.11)
RDW: 13.7 % (ref 11.5–15.5)
WBC Count: 5 10*3/uL (ref 4.0–10.5)
nRBC: 0 % (ref 0.0–0.2)

## 2020-03-25 NOTE — Progress Notes (Signed)
Hematology and Oncology Follow Up Visit  Kristy Olson [id number] 01/14/1932 86 y.o. 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 She is doing well but is still having issues with the ulcer on her left heel. She goes every Thursday to see wound care to have this redressed. It is dress at this time and she is wearing a compression stocking.  She is doing well on Femara and still has hot flashes off and on. She states that she has had these since starting the medication.  Breast exam today was negative.  No lymphadenopathy.  No fever, chills, n/v, cough, rash, dizziness, SOB, chest pain, palpitations, abdominal pain or changes in bowel or bladder habits.  No numbness or tingling in her extremities.  No falls or syncope.  She has maintained a good appetite and is staying well hydrated. Her weight is stable at 117 lbs.   ECOG Performance Status: 1 - Symptomatic but completely ambulatory  Medications:  Allergies as of 03/25/2020      Reactions   Neomy-bacit-polymyx-pramoxine Itching   Amoxicillin Hives, Other (See Comments)   Colesevelam Hcl Other (See Comments)   Doxycycline Hives, Other (See Comments)   Levaquin  [levofloxacin In D5w] Other (See Comments)   Levofloxacin Other (See Comments)   Other Itching, Rash   curad triple antibiotic ointment. Curad Triple Antibiotic Ointment   Statins Hives, Other (See Comments)   Hives Other reaction(s): unknown   Colesevelam Hives   Neomycin Hives   Allergic reaction only topically. Only topically   Red Dye Hives, Itching   Red frosting caused itching and facial swelling Patient is uncertain this reaction occurred (02/21/18) currently eats foods with red dye   Tape Hives, Itching      Medication List       Accurate  as of March 25, 2020  2:14 PM. If you have any questions, ask your nurse or doctor.        aspirin 81 MG tablet Take 81 mg by mouth daily.   ipratropium 0.06 % nasal spray Commonly known as: ATROVENT Place into both nostrils daily.   letrozole 2.5 MG tablet Commonly known as: FEMARA TAKE 1 TABLET(2.5 MG) BY MOUTH DAILY   metoprolol succinate 25 MG 24 hr tablet Commonly known as: TOPROL-XL TAKE 1 TABLET(25 MG) BY MOUTH DAILY   Synthroid 150 MCG tablet Generic drug: levothyroxine TAKE 1 TABLET(150 MCG) BY MOUTH DAILY       Allergies:  Allergies  Allergen Reactions  . Neomy-Bacit-Polymyx-Pramoxine Itching  . Amoxicillin Hives and Other (See Comments)  . Colesevelam Hcl Other (See Comments)  . Doxycycline Hives and Other (See Comments)  . Levaquin  [Levofloxacin In D5w] Other (See Comments)  . Levofloxacin Other (See Comments)  . Other Itching and Rash    curad triple antibiotic ointment. Curad Triple Antibiotic Ointment  . Statins Hives and Other (See Comments)    Hives Other reaction(s): unknown  . Colesevelam Hives  . Neomycin Hives    Allergic reaction only topically. Only topically  . Red Dye Hives and Itching    Red frosting caused itching and facial swelling  Patient is uncertain this reaction occurred (02/21/18) currently eats foods with red dye  . Tape Hives and Itching    Past Medical History,  Surgical history, Social history, and Family History were reviewed and updated.  Review of Systems: All other 10 point review of systems is negative.   Physical Exam:  vitals were not taken for this visit.   Wt Readings from Last 3 Encounters:  02/24/20 117 lb 14.4 oz (53.5 kg)  12/29/19 121 lb (54.9 kg)  11/06/19 127 lb 1.9 oz (57.7 kg)    Ocular: Sclerae unicteric, pupils equal, round and reactive to light Ear-nose-throat: Oropharynx clear, dentition fair Lymphatic: No cervical or supraclavicular adenopathy Lungs no rales or rhonchi, good excursion  bilaterally Heart regular rate and rhythm, no murmur appreciated Abd soft, nontender, positive bowel sounds, no liver or spleen tip palpated on exam, no fluid wave  MSK no focal spinal tenderness, no joint edema Neuro: non-focal, well-oriented, appropriate affect Breasts: Deferred   Lab Results  Component Value Date   WBC 5.0 03/25/2020   HGB 10.3 (L) 03/25/2020   HCT 31.7 (L) 03/25/2020   MCV 92.4 03/25/2020   PLT 207 03/25/2020   Lab Results  Component Value Date   FERRITIN 57 05/02/2018   IRON 81 05/02/2018   TIBC 367 05/02/2018   UIBC 271 04/30/2017   IRONPCTSAT 22 05/02/2018   Lab Results  Component Value Date   RBC 3.43 (L) 03/25/2020   No results found for: KPAFRELGTCHN, LAMBDASER, KAPLAMBRATIO No results found for: IGGSERUM, IGA, IGMSERUM No results found for: Odetta Pink, SPEI   Chemistry      Component Value Date/Time   NA 139 03/25/2020 1322   NA 144 04/30/2017 1134   NA 141 03/13/2017 1143   K 4.2 03/25/2020 1322   K 3.8 04/30/2017 1134   K 4.4 03/13/2017 1143   CL 102 03/25/2020 1322   CL 102 04/30/2017 1134   CO2 30 03/25/2020 1322   CO2 31 04/30/2017 1134   CO2 26 03/13/2017 1143   BUN 23 03/25/2020 1322   BUN 18 04/30/2017 1134   BUN 21.2 03/13/2017 1143   CREATININE 0.91 03/25/2020 1322   CREATININE 0.95 (H) 02/24/2020 1458   CREATININE 0.9 03/13/2017 1143      Component Value Date/Time   CALCIUM 9.3 03/25/2020 1322   CALCIUM 9.2 04/30/2017 1134   CALCIUM 9.5 03/13/2017 1143   ALKPHOS 57 03/25/2020 1322   ALKPHOS 68 04/30/2017 1134   ALKPHOS 80 03/13/2017 1143   AST 14 (L) 03/25/2020 1322   AST 21 03/13/2017 1143   ALT 6 03/25/2020 1322   ALT 19 04/30/2017 1134   ALT 13 03/13/2017 1143   BILITOT 0.5 03/25/2020 1322   BILITOT 0.50 03/13/2017 1143       Impression and Plan: Kristy Olson is a very pleasant 86 yo caucasian female withhistory of stage I tubulo-lobular carcinoma of  the left breast with lumpectomy in August 2017. She continues to do well on Femara and will continue her same regimen.  We will see her again in another 6 months.  She can contact our office with any questions or concerns.   Laverna Peace, NP 8/5/20212:14 PM

## 2020-03-25 NOTE — Telephone Encounter (Signed)
Appointments scheduled calendar printed per 8/5 los 

## 2020-03-26 LAB — CANCER ANTIGEN 27.29: CA 27.29: 19.7 U/mL (ref 0.0–38.6)

## 2020-03-29 ENCOUNTER — Ambulatory Visit (INDEPENDENT_AMBULATORY_CARE_PROVIDER_SITE_OTHER): Payer: Medicare Other | Admitting: Osteopathic Medicine

## 2020-03-29 ENCOUNTER — Encounter: Payer: Self-pay | Admitting: Osteopathic Medicine

## 2020-03-29 VITALS — BP 105/56 | HR 70 | Wt 119.0 lb

## 2020-03-29 DIAGNOSIS — R252 Cramp and spasm: Secondary | ICD-10-CM | POA: Diagnosis not present

## 2020-03-29 DIAGNOSIS — M79671 Pain in right foot: Secondary | ICD-10-CM

## 2020-03-29 DIAGNOSIS — N3941 Urge incontinence: Secondary | ICD-10-CM

## 2020-03-29 DIAGNOSIS — M79672 Pain in left foot: Secondary | ICD-10-CM

## 2020-03-29 NOTE — Patient Instructions (Signed)
Foot pain  Will send to podiatry. I'm not too worried about these symptoms in terms of anything worse going on  Urge incontinence  Try going to the bathroom every few hours while awake  Avoid caffeine   Option to start medication if you'd like to , let me know  Hand cramp  Again, I am not too worried about anything worse going on  I don't think a specialist will be able to offer you much like surgery or injections  Can try physical therapy if you'd like   Try keeping hands warm, use heating pad or warm water  OK to try adding a multivitamin, Calcium/Vitamin D supplement no more than 1300 mg per day calcium, or can try Magnesium Citrate supplement no more than 400 mg per day

## 2020-03-29 NOTE — Progress Notes (Signed)
Kristy Olson is a 86 y.o. female who presents to  [place] [facility]  today, 03/29/20, seeking care for the following:  . Referral request for: o Hand pain - "hands don't work" sometimes, feel stiff, has to oen and close fingers to get her fingers moving. Labs reviewed 03/25/2020, Hgb 10.3, CMP WNL. Will get cramps, feels like she can't use her fingers.  o Renal/Urinary problem - referred to urology few years ago Dr Redmond Pulling at [facility]. R renal mass, on record review looks like appt never made? She thinks she went this past year to someone in [place]. Incontinence pads used at night since 03/2019 and has worked ok for her, still having some urgency at night.  o Podiatry - foot pain. Hx ulcer L ankle, hd been following w/ wound care and vascular surgery. "My toes don't feel like my toes anymore." Ongoing 1+ years. Pt not able to describe this very well. Toes feel stiff, denies numbness, denies tingling, denies cold, denies skin changes. Saw something on TV that made her concerned but she can't remember what specifically she saw. Friend of hers has neuropathy so she wanted to see same podiatrist.      [place] with other pertinent findings:  The primary encounter diagnosis was Foot pain, bilateral. Diagnoses of Urge incontinence and Hand cramp were also pertinent to this visit.    Patient Instructions  Foot pain  Will send to podiatry. I'm not too worried about these symptoms in terms of anything worse going on  Urge incontinence  Try going to the bathroom every few hours while awake  Avoid caffeine   Option to start medication if you'd like to , let me know  Hand cramp  Again, I am not too worried about anything worse going on  I don't think a specialist will be able to offer you much like surgery or injections  Can try physical therapy if you'd like   Try keeping hands warm, use heating pad or warm water  OK to try  adding a multivitamin, Calcium/Vitamin D supplement no more than 1300 mg per day calcium, or can try Magnesium Citrate supplement no more than 400 mg per day           Orders Placed This Encounter  Procedures  . Ambulatory referral to Podiatry    No orders of the defined types were placed in this encounter.      Follow-up instructions: Return if symptoms worsen or fail to improve.                                         BP (!) 105/56 (BP Location: Left Arm, Patient Position: Sitting)   Pulse 70   Wt 119 lb (54 kg)   SpO2 97%   BMI 18.64 kg/m   Current Meds  Medication Sig  . aspirin 81 MG tablet Take 81 mg by mouth daily.  Marland Kitchen ipratropium (ATROVENT) 0.06 % nasal spray Place into both nostrils daily.  Marland Kitchen letrozole (FEMARA) 2.5 MG tablet TAKE 1 TABLET(2.5 MG) BY MOUTH DAILY  . metoprolol succinate (TOPROL-XL) 25 MG 24 hr tablet TAKE 1 TABLET(25 MG) BY MOUTH DAILY  . sulfamethoxazole-trimethoprim (BACTRIM DS) 800-160 MG tablet Take 1 tablet by mouth 2 (two) times daily.  Marland Kitchen SYNTHROID 150 MCG tablet TAKE 1 TABLET(150 MCG) BY MOUTH DAILY    No  results found for this or any previous visit (from the past 46 hour(s)).  No results found.     All questions at time of visit were answered - patient instructed to contact office with any additional concerns or updates.  ER/RTC precautions were reviewed with the patient as applicable.   Please note: voice recognition software was used to produce this document, and typos may escape review. Please contact Dr. Sheppard Coil for any needed clarifications.   Total encounter time: 30 minutes.

## 2020-04-29 ENCOUNTER — Encounter: Payer: Self-pay | Admitting: Osteopathic Medicine

## 2020-04-29 ENCOUNTER — Ambulatory Visit (INDEPENDENT_AMBULATORY_CARE_PROVIDER_SITE_OTHER): Payer: Medicare Other | Admitting: Osteopathic Medicine

## 2020-04-29 VITALS — BP 130/70 | HR 85 | Wt 116.0 lb

## 2020-04-29 DIAGNOSIS — R0781 Pleurodynia: Secondary | ICD-10-CM

## 2020-04-29 DIAGNOSIS — N3941 Urge incontinence: Secondary | ICD-10-CM | POA: Diagnosis not present

## 2020-04-29 MED ORDER — OXYBUTYNIN CHLORIDE 5 MG PO TABS: 5.0000 mg | ORAL_TABLET | Freq: Every day | ORAL | 0 refills | Status: DC

## 2020-04-29 NOTE — Progress Notes (Signed)
HPI: Kristy Olson is a 86 y.o. female who  has a past medical history of Abnormal finding on MRI of brain, Breast cancer of upper-inner quadrant of left female breast ([place]) (10/12/2015), CAD (coronary artery disease), Chronic venous insufficiency, Degeneration of lumbar or lumbosacral intervertebral disc, History of thyroid cancer, Hyperlipidemia, Hypothyroid, Idiopathic scoliosis, Osteoporosis, PNEUMONIA, COMMUNITY ACQUIRED, PNEUMOCOCCAL, and Venous stasis ulcer ([place]).  she presents to [facility] today, 04/29/20,  for chief complaint of: Left rib pain, urinary incontinence  Patient reports 1 month of episodes of pain around her left lower ribs. She does not recall any specific injury or trauma that could have caused the pain, though she does note she has had 3 falls in the last 6 months. She only sought care for the second fall because of a knee injury. These episodes primarily occur at night when she's sitting in her recliner elevating her left foot. She does not use ibuprofen, tylenol, or ice/heat, stating she typically falls asleep or gets distracted by the TV and it resolves. She states the pain got worse for about a week and has been improving since then and the pain has not occurred in the past couple days. She wanted to ensure this was not something serious before going to visit her granddaughter for her parents' weekend next weekend. She denies nausea, vomiting, abdominal pain, pain with breathing, chest pain, shortness of breath.  Patient also notes ongoing urinary incontinence for several months. She states it happens more frequently when she is lying down than when she sits in her chair. She states at night she has to get up every 2 hours to urinate and often cannot make it to the bathroom in time. She has started wearing a pad at night to prevent accidents, but sometimes the pad is not enough. She states it sometimes happens during the day as well where  she has no time to get to the bathroom when she has to urinate. She has tried behavioral changes, without improvement. She denies any pain with urination, malodorous urine, hematuria, fever, chills, abdominal or flank pain.  Past medical, surgical, social and family history reviewed:  Patient Active Problem List   Diagnosis Date Noted  . Melanoma ([place]) 08/17/2017  . Mohs defect 08/17/2017  . 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 Decreased thyroid stimulating hormone (TSH) level 07/19/2016  . Filamentary keratitis of right eye 06/26/2016  . Keratoconjunctivitis sicca due to decreased tear production, bilateral 06/26/2016  . Blepharitis of both eyes 06/26/2016  . Atopic conjunctivitis of both eyes 06/26/2016  . Cerumen impaction 04/17/2016  . Hair loss 04/17/2016  . Breast cancer of upper-inner quadrant of left female breast ([place]) 10/12/2015  . Rhinorrhea 12/15/2014  . Chest pain 12/02/2014  . Hypothyroidism, postsurgical 07/21/2014  . CAD (coronary artery disease) 04/29/2014  . Abnormal finding on MRI of brain 04/14/2014  . Malignant neoplasm of thyroid gland ([place]) 04/14/2014  . History of thyroid cancer 03/27/2014  . Chronic venous insufficiency 12/23/2013  . Degeneration of lumbar or lumbosacral intervertebral disc 10/08/2013  . Idiopathic scoliosis 10/08/2013  . Osteoporosis 10/06/2013  . Essential hypertension, benign 08/18/2013  . Venous stasis ulcer of left lower extremity ([place]) 08/06/2013  . Grief 08/06/2013  . Elevated triglycerides with high cholesterol 08/15/2010    Past Surgical History:  Procedure Laterality Date  . BREAST LUMPECTOMY WITH RADIOACTIVE SEED LOCALIZATION Left 03/29/2016   Procedure: LEFT BREAST LUMPECTOMY WITH RADIOACTIVE SEED LOCALIZATION;  Surgeon: Autumn Messing III, MD;  Location: [facility];  Service: General;  Laterality: Left;  LEFT BREAST LUMPECTOMY WITH RADIOACTIVE SEED LOCALIZATION  . cranotomy    . THYROIDECTOMY      Social History    Tobacco Use  . Smoking status: Never Smoker  . Smokeless tobacco: Never Used  Substance Use Topics  . Alcohol use: Yes    Alcohol/week: 0.0 standard drinks    Comment: Occasional    Family History  Problem Relation Age of Onset  . CAD Sister   . Cancer Sister   . Heart disease Sister   . Diabetes Father   . COPD Father      Current medication list and allergy/intolerance information reviewed:    Current Outpatient Medications  Medication Sig Dispense Refill  . aspirin 81 MG tablet Take 81 mg by mouth daily.    Marland Kitchen ipratropium (ATROVENT) 0.06 % nasal spray Place into both nostrils daily.    Marland Kitchen letrozole (FEMARA) 2.5 MG tablet TAKE 1 TABLET(2.5 MG) BY MOUTH DAILY 90 tablet 0  . metoprolol succinate (TOPROL-XL) 25 MG 24 hr tablet TAKE 1 TABLET(25 MG) BY MOUTH DAILY (Patient taking differently: TAKE 1 TABLET(25 MG) BY MOUTH DAILY,) 90 tablet 0  . sulfamethoxazole-trimethoprim (BACTRIM DS) 800-160 MG tablet Take 1 tablet by mouth 2 (two) times daily.    Marland Kitchen SYNTHROID 150 MCG tablet TAKE 1 TABLET(150 MCG) BY MOUTH DAILY 90 tablet 1  . oxybutynin (DITROPAN) 5 MG tablet Take 1 tablet (5 mg total) by mouth at bedtime. 30 tablet 0   No current facility-administered medications for this visit.    Allergies  Allergen Reactions  . Neomy-Bacit-Polymyx-Pramoxine Itching  . Amoxicillin Hives and Other (See Comments)  . Colesevelam Hcl Other (See Comments)  . Doxycycline Hives and Other (See Comments)  . Levaquin  [Levofloxacin In D5w] Other (See Comments)  . Levofloxacin Other (See Comments)  . Other Itching and Rash    curad triple antibiotic ointment. Curad Triple Antibiotic Ointment  . Statins Hives and Other (See Comments)    Hives Other reaction(s): unknown  . Colesevelam Hives  . Neomycin Hives    Allergic reaction only topically. Only topically  . Red Dye Hives and Itching    Red frosting caused itching and facial swelling  Patient is uncertain this reaction occurred  (02/21/18) currently eats foods with red dye  . Tape Hives and Itching      Review of Systems:  Constitutional:  No  fever, no chills  HEENT: No  headache, no vision change  Cardiac: No  chest pain  Respiratory:  No  shortness of breath. No  Cough  Gastrointestinal: No  abdominal pain, No  nausea, No  vomiting  Musculoskeletal: + left rib pain  Skin: No  Rash, No other wounds/concerning lesions  Genitourinary: + urinarry  incontinence  Neurologic: No  weakness, No  dizziness  Psychiatric: No  concerns with depression  Exam:  BP 130/70 (BP Location: Left Arm, Patient Position: Sitting)   Pulse 85   Wt 116 lb (52.6 kg)   SpO2 98%   BMI 18.17 kg/m   Constitutional: VS see above. General Appearance: alert, well-developed, well-nourished, NAD  Eyes: Normal lids and conjunctive, non-icteric sclera. Eyes sunken in skull.  Neck: No masses, trachea midline.   Respiratory: Normal respiratory effort. no wheeze, no rhonchi, no rales  Cardiovascular: S1/S2 normal, no murmur, no rub/gallop auscultated. RRR.  Gastrointestinal: Nontender, no masses. No hepatomegaly, no splenomegaly.   Musculoskeletal: Gait normal. No clubbing/cyanosis of digits.   Neurological:  Normal balance/coordination. No tremor. No cranial nerve deficit on limited exam.  Skin: warm, dry, intact. No rash/ulcer. No concerning nevi or subq nodules on limited exam.  Psychiatric: Normal judgment/insight. Normal mood and affect. Oriented x3.    No results found for this or any previous visit (from the past 72 hour(s)).  No results found.   ASSESSMENT/PLAN: The primary encounter diagnosis was Urge incontinence. A diagnosis of Rib pain on left side was also pertinent to this visit.   Left rib pain  Pt's symptoms have improved. No tenderness or other relevant findings on exam. Likely muscle strain or other injury that is now resolving.  If pain recurs or worsens, can follow up and try ibuprofen and  tylenol. Consider imaging  Urge urinary incontinence  Pt has tried behavioral changes without improvement.  Will trial oxybutinin and re-evaluate. Can refill if effective for symptoms.  Follow up if symptoms worsen or do not improve.   No orders of the defined types were placed in this encounter.   Meds ordered this encounter  Medications  . oxybutynin (DITROPAN) 5 MG tablet    Sig: Take 1 tablet (5 mg total) by mouth at bedtime.    Dispense:  30 tablet    Refill:  0    There are no Patient Instructions on file for this visit.      Visit summary with medication list and pertinent instructions was printed for patient to review. All questions at time of visit were answered - patient instructed to contact office with any additional concerns or updates. ER/RTC precautions were reviewed with the patient.   Please note: voice recognition software was used to produce this document, and typos may escape review. Please contact Dr. Sheppard Coil for any needed clarifications.     Follow-up plan: Return if symptoms worsen or fail to improve / AS NEEDED.  Gweneth Dimitri, [facility] MS3

## 2020-06-20 ENCOUNTER — Other Ambulatory Visit: Payer: Self-pay | Admitting: Hematology & Oncology

## 2020-06-20 DIAGNOSIS — C50112 Malignant neoplasm of central portion of left female breast: Secondary | ICD-10-CM

## 2020-06-20 DIAGNOSIS — C73 Malignant neoplasm of thyroid gland: Secondary | ICD-10-CM

## 2020-06-28 ENCOUNTER — Other Ambulatory Visit: Payer: Self-pay

## 2020-06-28 MED ORDER — OXYBUTYNIN CHLORIDE 5 MG PO TABS: 5.0000 mg | ORAL_TABLET | Freq: Every day | ORAL | 0 refills | Status: DC

## 2020-07-01 DIAGNOSIS — I87301 Chronic venous hypertension (idiopathic) without complications of right lower extremity: Secondary | ICD-10-CM | POA: Insufficient documentation

## 2020-08-12 ENCOUNTER — Other Ambulatory Visit: Payer: Self-pay | Admitting: Urology

## 2020-08-12 DIAGNOSIS — D49511 Neoplasm of unspecified behavior of right kidney: Secondary | ICD-10-CM

## 2020-08-19 ENCOUNTER — Ambulatory Visit (HOSPITAL_COMMUNITY): Payer: Medicare Other

## 2020-08-30 ENCOUNTER — Ambulatory Visit (HOSPITAL_COMMUNITY): Admission: RE | Admit: 2020-08-30 | Payer: Medicare Other | Source: Ambulatory Visit

## 2020-08-30 ENCOUNTER — Encounter (HOSPITAL_COMMUNITY): Payer: Self-pay

## 2020-08-30 DIAGNOSIS — A498 Other bacterial infections of unspecified site: Secondary | ICD-10-CM | POA: Insufficient documentation

## 2020-09-07 ENCOUNTER — Ambulatory Visit: Payer: Medicare Other | Admitting: Osteopathic Medicine

## 2020-09-12 ENCOUNTER — Other Ambulatory Visit: Payer: Self-pay | Admitting: Nurse Practitioner

## 2020-09-12 DIAGNOSIS — E890 Postprocedural hypothyroidism: Secondary | ICD-10-CM

## 2020-09-21 ENCOUNTER — Encounter: Payer: Self-pay | Admitting: Osteopathic Medicine

## 2020-09-21 ENCOUNTER — Other Ambulatory Visit: Payer: Self-pay

## 2020-09-21 ENCOUNTER — Ambulatory Visit (INDEPENDENT_AMBULATORY_CARE_PROVIDER_SITE_OTHER): Payer: Medicare Other | Admitting: Osteopathic Medicine

## 2020-09-21 VITALS — BP 131/65 | HR 71 | Temp 98.3°F | Wt 105.1 lb

## 2020-09-21 DIAGNOSIS — I872 Venous insufficiency (chronic) (peripheral): Secondary | ICD-10-CM | POA: Diagnosis not present

## 2020-09-21 DIAGNOSIS — Z23 Encounter for immunization: Secondary | ICD-10-CM | POA: Diagnosis not present

## 2020-09-21 DIAGNOSIS — E890 Postprocedural hypothyroidism: Secondary | ICD-10-CM

## 2020-09-21 DIAGNOSIS — M818 Other osteoporosis without current pathological fracture: Secondary | ICD-10-CM

## 2020-09-21 DIAGNOSIS — K862 Cyst of pancreas: Secondary | ICD-10-CM

## 2020-09-21 DIAGNOSIS — R634 Abnormal weight loss: Secondary | ICD-10-CM | POA: Diagnosis not present

## 2020-09-21 DIAGNOSIS — N3941 Urge incontinence: Secondary | ICD-10-CM | POA: Diagnosis not present

## 2020-09-21 DIAGNOSIS — R935 Abnormal findings on diagnostic imaging of other abdominal regions, including retroperitoneum: Secondary | ICD-10-CM

## 2020-09-21 NOTE — Patient Instructions (Addendum)
Weight loss: The infection is not helping this! You may be losing weight because your body is spending so much energy trying to fight the infection. I see that your Infectious Disease specialist recommends IV antibiotics. I think you should very seriously consider doing this. We will also get blood work and scan your chest, abdomen, and pelvis to see if anything else might be going on.   Urinary problems: Any medications we give for urinary urgency will have potential side effects including dry mouth. These medications are meant to improve quality of life, they are not medically absolutely needed! If you decide not to take these medicines, that's ok! If you decide to take these medicines and you have side effects that outweigh the benefits, we can always stop them!   Constipation: Trial MiraLax every day until bowel movement and then can continue every other day or every three days. Be active and stay hydrated, this will also help keep thins moving.

## 2020-09-21 NOTE — Progress Notes (Signed)
HPI: Kristy Olson is a 87 y.o. female who  has a past medical history of Abnormal finding on MRI of brain, Breast cancer of upper-inner quadrant of left female breast ([place]) (10/12/2015), CAD (coronary artery disease), Chronic venous insufficiency, Degeneration of lumbar or lumbosacral intervertebral disc, History of thyroid cancer, Hyperlipidemia, Hypothyroid, Idiopathic scoliosis, Osteoporosis, PNEUMONIA, COMMUNITY ACQUIRED, PNEUMOCOCCAL, and Venous stasis ulcer ([place]).  she presents to [facility] today, 09/21/20,  for chief complaint of:  Weight loss Pt has lost about 25 lbs over the last 5 years. She reports never eating breakfast. She eats lunch and dinner daily provided by her assisted living facility. She reports finishing her entire meal when eating, and generally a good appetite.  She recently was quarantined to her room for 10 days due to [place] in her living facility and was unable to have much activity during this time.  Pt has had chronic wound infection in L ankle for 2 years that may contribute to her slow weight loss. She also has a history of metastatic thyroid cancer and breast cancer.   Denies: fevers, chest pain, abnormal vaginal bleeding, shortness of breath, night sweats   Abdominal discomfort Pt reports generalized abdominal discomfort for the past 6 months or so. It occurs at night time when sitting in her chair or laying in bed. Nothing makes it better or worse. Pain does not radiate. She reports associated constipation with a BM every few days. She does not usually have to strain though.  She feels that her abdominal discomfort is related to her weight loss.   Denies: nausea, vomiting, blood in stool, diarrhea, abnormal vaginal bleeding  Oxybutynin  Pt was prescribed oxybutynin in September, but has not started taking the medication yet. She had questions about mechanism of oxybutynin for urge incontinence treatment and wanted  to know about dry mouth as a possible side effect.     Past medical, surgical, social and family history reviewed:  Patient Active Problem List   Diagnosis Date Noted  . Melanoma ([place]) 08/17/2017  . Mohs defect 08/17/2017  . 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 Decreased thyroid stimulating hormone (TSH) level 07/19/2016  . Filamentary keratitis of right eye 06/26/2016  . Keratoconjunctivitis sicca due to decreased tear production, bilateral 06/26/2016  . Blepharitis of both eyes 06/26/2016  . Atopic conjunctivitis of both eyes 06/26/2016  . Cerumen impaction 04/17/2016  . Hair loss 04/17/2016  . Breast cancer of upper-inner quadrant of left female breast ([place]) 10/12/2015  . Rhinorrhea 12/15/2014  . Chest pain 12/02/2014  . Hypothyroidism, postsurgical 07/21/2014  . CAD (coronary artery disease) 04/29/2014  . Abnormal finding on MRI of brain 04/14/2014  . Malignant neoplasm of thyroid gland ([place]) 04/14/2014  . History of thyroid cancer 03/27/2014  . Chronic venous insufficiency 12/23/2013  . Degeneration of lumbar or lumbosacral intervertebral disc 10/08/2013  . Idiopathic scoliosis 10/08/2013  . Osteoporosis 10/06/2013  . Essential hypertension, benign 08/18/2013  . Venous stasis ulcer of left lower extremity ([place]) 08/06/2013  . Grief 08/06/2013  . Elevated triglycerides with high cholesterol 08/15/2010    Past Surgical History:  Procedure Laterality Date  . BREAST LUMPECTOMY WITH RADIOACTIVE SEED LOCALIZATION Left 03/29/2016   Procedure: LEFT BREAST LUMPECTOMY WITH RADIOACTIVE SEED LOCALIZATION;  Surgeon: Autumn Messing III, MD;  Location: [place];  Service: General;  Laterality: Left;  LEFT BREAST LUMPECTOMY WITH RADIOACTIVE SEED LOCALIZATION  . cranotomy    . THYROIDECTOMY      Social History  Tobacco Use  . Smoking status: Never Smoker  . Smokeless tobacco: Never Used  Substance Use Topics  . Alcohol use: Yes    Alcohol/week: 0.0 standard drinks     Comment: Occasional    Family History  Problem Relation Age of Onset  . CAD Sister   . Cancer Sister   . Heart disease Sister   . Diabetes Father   . COPD Father      Current medication list and allergy/intolerance information reviewed:    Current Outpatient Medications  Medication Sig Dispense Refill  . aspirin 81 MG tablet Take 81 mg by mouth daily.    Marland Kitchen ipratropium (ATROVENT) 0.06 % nasal spray Place into both nostrils daily.    Marland Kitchen letrozole (FEMARA) 2.5 MG tablet TAKE 1 TABLET(2.5 MG) BY MOUTH DAILY 90 tablet 0  . metoprolol succinate (TOPROL-XL) 25 MG 24 hr tablet TAKE 1 TABLET(25 MG) BY MOUTH DAILY (Patient taking differently: TAKE 1 TABLET(25 MG) BY MOUTH DAILY,) 90 tablet 0  . sulfamethoxazole-trimethoprim (BACTRIM DS) 800-160 MG tablet Take 1 tablet by mouth 2 (two) times daily.    Marland Kitchen SYNTHROID 150 MCG tablet TAKE 1 TABLET(150 MCG) BY MOUTH DAILY 90 tablet 1  . oxybutynin (DITROPAN) 5 MG tablet Take 1 tablet (5 mg total) by mouth at bedtime. (Patient not taking: Reported on 09/21/2020) 90 tablet 0   No current facility-administered medications for this visit.    Allergies  Allergen Reactions  . Neomy-Bacit-Polymyx-Pramoxine Itching  . Amoxicillin Hives and Other (See Comments)  . Colesevelam Hcl Other (See Comments)  . Doxycycline Hives and Other (See Comments)  . Levaquin  [Levofloxacin In D5w] Other (See Comments)  . Levofloxacin Other (See Comments)    03/25/2020 Patient states she does not remember reaction   . Other Itching and Rash    curad triple antibiotic ointment. Curad Triple Antibiotic Ointment  . Statins Hives and Other (See Comments)    Hives Other reaction(s): unknown  . Colesevelam Hives  . Neomycin Hives    Allergic reaction only topically. Only topically  . Red Dye Hives and Itching    Red frosting caused itching and facial swelling  Patient is uncertain this reaction occurred (02/21/18) currently eats foods with red dye  . Tape Hives and Itching       Review of Systems:  Constitutional:  No  fever, no chills, No recent illness, + unintentional weight loss. No significant fatigue.   HEENT: No  headache  Cardiac: No  chest pain, No  pressure, No palpitations  Respiratory:  No  shortness of breath.   Gastrointestinal: + genralized abdominal pain, No  nausea, No  vomiting,  No  blood in stool, No  diarrhea, +  constipation   Musculoskeletal: No new myalgia/arthralgia  Skin: No  Rash, No other wounds/concerning lesions  Genitourinary: +  incontinence, No  abnormal genital bleeding, + abnormal genital discharge (pt had 4 episodes of scant vaginal discharge) No labial itching, no dysuria  Endocrine: No polyuria/polydipsia  Psychiatric: No  concerns with depression  Exam:  BP 131/65 (BP Location: Left Arm, Patient Position: Sitting, Cuff Size: Small)   Pulse 71   Temp 98.3 F (36.8 C) (Oral)   Wt 105 lb 1.3 oz (47.7 kg)   BMI 16.46 kg/m   Constitutional: VS see above. General Appearance: alert, NAD, generally malnourished apeparing  Eyes: Normal lids and conjunctive, non-icteric sclera  Neck: No masses, trachea midline. No thyroid enlargement. No tenderness/mass appreciated. No lymphadenopathy  Respiratory: Normal respiratory effort.  no wheeze, no rhonchi, no rales  Cardiovascular: S1/S2 normal, no murmur, no rub/gallop auscultated. RRR. No lower extremity edema.  Gastrointestinal: Nontender, no masses. No hepatomegaly, no splenomegaly. No hernia appreciated. Bowel sounds normal. Rectal exam deferred.   Musculoskeletal: Gait normal. No clubbing/cyanosis of digits.   Neurological: Normal balance/coordination. No tremor.   Skin: warm, dry, intact. No rash/ulcer. No concerning nevi or subq nodules on limited exam.    Psychiatric: Normal judgment/insight. Normal mood and affect.   No results found for this or any previous visit (from the past 72 hour(s)).  No results found.   ASSESSMENT/PLAN: The primary  encounter diagnosis was Unintentional weight loss. Diagnoses of Need for influenza vaccination, Urge incontinence, Chronic venous insufficiency, Hypothyroidism, postsurgical, and Other osteoporosis without current pathological fracture were also pertinent to this visit.   Unintentional weight loss and abdominal discomfort - weight loss could be combination of deconditioning, malnourishment, chronic infection but given medical history will work up further to rule out malignancy or other medical causes of weight loss   CT chest, abdomen, pelvis w/ contrast  CBC, CMP  CRP, ESR  HIV and HCV testing  UA and UCx  TSH and free T4   Trial of miralax for constipation and to see if helps improve abdominal discomfort  Follow up pending result of above studies  Urge incontinence   Answered questions she had about oxybutynin  Pt will trial oxybutynin for a week to see if dry mouth side effect is tolerable or not for her  Follow up if side effects of medication are not tolerable and we can switch to different medication   Health maintenance   Flu vaccine given today    Orders Placed This Encounter  Procedures  . CT CHEST ABDOMEN PELVIS W CONTRAST  . Flu Vaccine QUAD High Dose(Fluad)  . CBC  . COMPLETE METABOLIC PANEL WITH GFR  . TSH  . T4, free  . HIV Antibody (routine testing w rflx)  . Hepatitis C antibody  . Urinalysis with Culture Reflex  . Sedimentation rate  . High sensitivity CRP    No orders of the defined types were placed in this encounter.   Patient Instructions  Weight loss: The infection is not helping this! You may be losing weight because your body is spending so much energy trying to fight the infection. I see that your Infectious Disease specialist recommends IV antibiotics. I think you should very seriously consider doing this. We will also get blood work and scan your chest, abdomen, and pelvis to see if anything else might be going on.   Urinary  problems: Any medications we give for urinary urgency will have potential side effects including dry mouth. These medications are meant to improve quality of life, they are not medically absolutely needed! If you decide not to take these medicines, that's ok! If you decide to take these medicines and you have side effects that outweigh the benefits, we can always stop them!   Constipation: Trial MiraLax every day until bowel movement and then can continue every other day or every three days. Be active and stay hydrated, this will also help keep thins moving.                 Visit summary with medication list and pertinent instructions was printed for patient to review. All questions at time of visit were answered - patient instructed to contact office with any additional concerns or updates. ER/RTC precautions were reviewed with the patient.  Please note: voice recognition software was used to produce this document, and typos may escape review. Please contact Dr. Sheppard Coil for any needed clarifications.     Follow-up plan: Return for RECHECK PENDING RESULTS / IF WORSE OR CHANGE.

## 2020-09-22 ENCOUNTER — Telehealth: Payer: Self-pay | Admitting: Osteopathic Medicine

## 2020-09-22 LAB — COMPLETE METABOLIC PANEL WITH GFR
AG Ratio: 1.4 (calc) (ref 1.0–2.5)
ALT: 6 U/L (ref 6–29)
AST: 13 U/L (ref 10–35)
Albumin: 3.6 g/dL (ref 3.6–5.1)
Alkaline phosphatase (APISO): 61 U/L (ref 37–153)
BUN: 24 mg/dL (ref 7–25)
CO2: 29 mmol/L (ref 20–32)
Calcium: 8.8 mg/dL (ref 8.6–10.4)
Chloride: 102 mmol/L (ref 98–110)
Creat: 0.86 mg/dL (ref 0.60–0.88)
GFR, Est African American: 71 mL/min/{1.73_m2} (ref >=60)
GFR, Est Non African American: 61 mL/min/{1.73_m2} (ref >=60)
Globulin: 2.6 g/dL (calc) (ref 1.9–3.7)
Glucose, Bld: 89 mg/dL (ref 65–99)
Potassium: 4.8 mmol/L (ref 3.5–5.3)
Sodium: 139 mmol/L (ref 135–146)
Total Bilirubin: 0.4 mg/dL (ref 0.2–1.2)
Total Protein: 6.2 g/dL (ref 6.1–8.1)

## 2020-09-22 LAB — URINALYSIS W MICROSCOPIC + REFLEX CULTURE
Bacteria, UA: NONE SEEN /HPF
Bilirubin Urine: NEGATIVE
Glucose, UA: NEGATIVE
Hgb urine dipstick: NEGATIVE
Hyaline Cast: NONE SEEN /LPF
Ketones, ur: NEGATIVE
Leukocyte Esterase: NEGATIVE
Nitrites, Initial: NEGATIVE
Protein, ur: NEGATIVE
RBC / HPF: NONE SEEN /HPF (ref 0–2)
Specific Gravity, Urine: 1.022 (ref 1.001–1.03)
Squamous Epithelial / HPF: NONE SEEN /HPF (ref <=5)
WBC, UA: NONE SEEN /HPF (ref 0–5)
pH: 5.5 (ref 5.0–8.0)

## 2020-09-22 LAB — CBC
HCT: 30.9 % — ABNORMAL LOW (ref 35.0–45.0)
Hemoglobin: 10.2 g/dL — ABNORMAL LOW (ref 11.7–15.5)
MCH: 29.3 pg (ref 27.0–33.0)
MCHC: 33 g/dL (ref 32.0–36.0)
MCV: 88.8 fL (ref 80.0–100.0)
MPV: 11.7 fL (ref 7.5–12.5)
Platelets: 190 10*3/uL (ref 140–400)
RBC: 3.48 10*6/uL — ABNORMAL LOW (ref 3.80–5.10)
RDW: 12.9 % (ref 11.0–15.0)
WBC: 6.1 10*3/uL (ref 3.8–10.8)

## 2020-09-22 LAB — HIV ANTIBODY (ROUTINE TESTING W REFLEX): HIV 1&2 Ab, 4th Generation: NONREACTIVE

## 2020-09-22 LAB — HEPATITIS C ANTIBODY
Hepatitis C Ab: NONREACTIVE
SIGNAL TO CUT-OFF: 0.02 (ref <1.00)

## 2020-09-22 LAB — TSH: TSH: 0.01 mIU/L — ABNORMAL LOW (ref 0.40–4.50)

## 2020-09-22 LAB — SEDIMENTATION RATE: Sed Rate: 36 mm/h — ABNORMAL HIGH (ref 0–30)

## 2020-09-22 LAB — HIGH SENSITIVITY CRP: hs-CRP: 2 mg/L

## 2020-09-22 LAB — NO CULTURE INDICATED

## 2020-09-22 LAB — T4, FREE: Free T4: 2.2 ng/dL — ABNORMAL HIGH (ref 0.8–1.8)

## 2020-09-22 NOTE — Telephone Encounter (Signed)
error 

## 2020-09-24 ENCOUNTER — Other Ambulatory Visit: Payer: Self-pay

## 2020-09-24 ENCOUNTER — Telehealth: Payer: Self-pay | Admitting: *Deleted

## 2020-09-24 ENCOUNTER — Encounter: Payer: Self-pay | Admitting: Hematology & Oncology

## 2020-09-24 ENCOUNTER — Inpatient Hospital Stay: Payer: Medicare Other | Attending: Hematology & Oncology

## 2020-09-24 ENCOUNTER — Inpatient Hospital Stay: Payer: Medicare Other | Admitting: Hematology & Oncology

## 2020-09-24 VITALS — BP 126/59 | HR 74 | Temp 98.1°F | Resp 19 | Wt 106.0 lb

## 2020-09-24 DIAGNOSIS — S80922D Unspecified superficial injury of left lower leg, subsequent encounter: Secondary | ICD-10-CM | POA: Diagnosis not present

## 2020-09-24 DIAGNOSIS — C50212 Malignant neoplasm of upper-inner quadrant of left female breast: Secondary | ICD-10-CM

## 2020-09-24 DIAGNOSIS — Z170 Estrogen receptor positive status [ER+]: Secondary | ICD-10-CM | POA: Diagnosis not present

## 2020-09-24 DIAGNOSIS — C50912 Malignant neoplasm of unspecified site of left female breast: Secondary | ICD-10-CM | POA: Diagnosis present

## 2020-09-24 DIAGNOSIS — Z85820 Personal history of malignant melanoma of skin: Secondary | ICD-10-CM | POA: Diagnosis not present

## 2020-09-24 DIAGNOSIS — M8000XP Age-related osteoporosis with current pathological fracture, unspecified site, subsequent encounter for fracture with malunion: Secondary | ICD-10-CM | POA: Diagnosis not present

## 2020-09-24 DIAGNOSIS — R634 Abnormal weight loss: Secondary | ICD-10-CM | POA: Diagnosis not present

## 2020-09-24 DIAGNOSIS — E890 Postprocedural hypothyroidism: Secondary | ICD-10-CM | POA: Diagnosis not present

## 2020-09-24 DIAGNOSIS — N2889 Other specified disorders of kidney and ureter: Secondary | ICD-10-CM | POA: Diagnosis not present

## 2020-09-24 DIAGNOSIS — Z79899 Other long term (current) drug therapy: Secondary | ICD-10-CM | POA: Insufficient documentation

## 2020-09-24 DIAGNOSIS — Z7982 Long term (current) use of aspirin: Secondary | ICD-10-CM | POA: Diagnosis not present

## 2020-09-24 LAB — CBC WITH DIFFERENTIAL (CANCER CENTER ONLY)
Abs Immature Granulocytes: 0.03 10*3/uL (ref 0.00–0.07)
Basophils Absolute: 0.1 10*3/uL (ref 0.0–0.1)
Basophils Relative: 1 %
Eosinophils Absolute: 0.2 10*3/uL (ref 0.0–0.5)
Eosinophils Relative: 2 %
HCT: 31.8 % — ABNORMAL LOW (ref 36.0–46.0)
Hemoglobin: 10.5 g/dL — ABNORMAL LOW (ref 12.0–15.0)
Immature Granulocytes: 0 %
Lymphocytes Relative: 17 %
Lymphs Abs: 1.2 10*3/uL (ref 0.7–4.0)
MCH: 29.4 pg (ref 26.0–34.0)
MCHC: 33 g/dL (ref 30.0–36.0)
MCV: 89.1 fL (ref 80.0–100.0)
Monocytes Absolute: 0.5 10*3/uL (ref 0.1–1.0)
Monocytes Relative: 7 %
Neutro Abs: 5.1 10*3/uL (ref 1.7–7.7)
Neutrophils Relative %: 73 %
Platelet Count: 197 10*3/uL (ref 150–400)
RBC: 3.57 MIL/uL — ABNORMAL LOW (ref 3.87–5.11)
RDW: 14.2 % (ref 11.5–15.5)
WBC Count: 7 10*3/uL (ref 4.0–10.5)
nRBC: 0 % (ref 0.0–0.2)

## 2020-09-24 LAB — CMP (CANCER CENTER ONLY)
ALT: 7 U/L (ref 0–44)
AST: 14 U/L — ABNORMAL LOW (ref 15–41)
Albumin: 3.8 g/dL (ref 3.5–5.0)
Alkaline Phosphatase: 58 U/L (ref 38–126)
Anion gap: 9 (ref 5–15)
BUN: 21 mg/dL (ref 8–23)
CO2: 27 mmol/L (ref 22–32)
Calcium: 9 mg/dL (ref 8.9–10.3)
Chloride: 104 mmol/L (ref 98–111)
Creatinine: 0.85 mg/dL (ref 0.44–1.00)
GFR, Estimated: >60 mL/min (ref >60)
Glucose, Bld: 112 mg/dL — ABNORMAL HIGH (ref 70–99)
Potassium: 4.4 mmol/L (ref 3.5–5.1)
Sodium: 140 mmol/L (ref 135–145)
Total Bilirubin: 0.3 mg/dL (ref 0.3–1.2)
Total Protein: 6 g/dL — ABNORMAL LOW (ref 6.5–8.1)

## 2020-09-24 NOTE — Telephone Encounter (Signed)
Per los 09/24/20 gave patient upcoming appts with calendar

## 2020-09-24 NOTE — Progress Notes (Signed)
Hematology and Oncology Follow Up Visit  Kristy Olson [id number] Aug 07, 1932 87 y.o. 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  The problem she has is this weight loss.  It is continual.  I am not sure as to why she is losing weight.  Her weight is now down to 106 pounds.  This really is quite low.  She tells me that there is something that was found on a kidney.  She is not sure at all which kidney it is.  I am not sure exactly how this is being evaluated.  Sound like she might have some CAT scans next week.  She still has a poorly healing wound in the left lower leg.  This is been going on for couple years.  She is on Bactrim for this.  I really think we can stop the Femara right now.  She has been on Femara now for over 5 years.  I really think that given this low grade low stage breast cancer, that we can get her off Femara.  Again, the weight loss is just really troublesome to me.  I am not sure as to why she has this weight loss.  Overall, her performance status is probably ECOG 2-3.    Medications:  Allergies as of 09/24/2020      Reactions   Neomy-bacit-polymyx-pramoxine Itching   Amoxicillin Hives, Other (See Comments)   Colesevelam Hcl Other (See Comments)   Doxycycline Hives, Other (See Comments)   Levaquin  [levofloxacin In D5w] Other (See Comments)   Levofloxacin Other (See Comments)   03/25/2020 Patient states she does not remember reaction    Other Itching, Rash   curad triple antibiotic ointment. Curad Triple Antibiotic Ointment   Statins Hives, Other (See Comments)   Hives Other reaction(s): unknown   Colesevelam Hives   Neomycin Hives   Allergic reaction only topically. Only topically   Red Dye Hives, Itching   Red  frosting caused itching and facial swelling Patient is uncertain this reaction occurred (02/21/18) currently eats foods with red dye   Tape Hives, Itching      Medication List       Accurate as of September 24, 2020  2:23 PM. If you have any questions, ask your nurse or doctor.        aspirin 81 MG tablet Take 81 mg by mouth daily.   cefUROXime 500 MG tablet Commonly known as: CEFTIN Take 500 mg by mouth 2 (two) times daily.   ipratropium 0.06 % nasal spray Commonly known as: ATROVENT Place into both nostrils daily.   letrozole 2.5 MG tablet Commonly known as: FEMARA TAKE 1 TABLET(2.5 MG) BY MOUTH DAILY   metoprolol succinate 25 MG 24 hr tablet Commonly known as: TOPROL-XL TAKE 1 TABLET(25 MG) BY MOUTH DAILY What changed: additional instructions   oxybutynin 5 MG tablet Commonly known as: DITROPAN Take 1 tablet (5 mg total) by mouth at bedtime.   sulfamethoxazole-trimethoprim 800-160 MG tablet Commonly known as: BACTRIM DS Take 1 tablet by mouth 2 (two) times daily.   Synthroid 150 MCG tablet Generic drug: levothyroxine TAKE 1 TABLET(150 MCG) BY MOUTH DAILY       Allergies:  Allergies  Allergen Reactions  . Neomy-Bacit-Polymyx-Pramoxine Itching  .  Amoxicillin Hives and Other (See Comments)  . Colesevelam Hcl Other (See Comments)  . Doxycycline Hives and Other (See Comments)  . Levaquin  [Levofloxacin In D5w] Other (See Comments)  . Levofloxacin Other (See Comments)    03/25/2020 Patient states she does not remember reaction   . Other Itching and Rash    curad triple antibiotic ointment. Curad Triple Antibiotic Ointment  . Statins Hives and Other (See Comments)    Hives Other reaction(s): unknown  . Colesevelam Hives  . Neomycin Hives    Allergic reaction only topically. Only topically  . Red Dye Hives and Itching    Red frosting caused itching and facial swelling  Patient is uncertain this reaction occurred (02/21/18) currently eats foods with red dye   . Tape Hives and Itching    Past Medical History, Surgical history, Social history, and Family History were reviewed and updated.  Review of Systems: Review of Systems  Constitutional: Positive for weight loss.  HENT: Negative.   Eyes: Negative.   Cardiovascular: Negative.   Gastrointestinal: Negative.   Genitourinary: Negative.   Musculoskeletal: Positive for myalgias and neck pain.  Skin: Negative.   Neurological: Negative.   Endo/Heme/Allergies: Negative.   Psychiatric/Behavioral: Negative.      Physical Exam:  weight is 106 lb (48.1 kg). Her oral temperature is 98.1 F (36.7 C). Her blood pressure is 126/59 (abnormal) and her pulse is 74. Her respiration is 19 and oxygen saturation is 98%.   Wt Readings from Last 3 Encounters:  09/24/20 106 lb (48.1 kg)  09/21/20 105 lb 1.3 oz (47.7 kg)  04/29/20 116 lb (52.6 kg)    Physical Exam Vitals reviewed.  Constitutional:      Comments: Her breast exam shows right breast with no masses, edema or erythema.  There is no right axillary adenopathy.  Left breast shows healed lumpectomy scar at about the 3 o'clock position.  There is no breast swelling.  There is no erythema.  There is no left axillary adenopathy.  HENT:     Head: Normocephalic and atraumatic.     Mouth/Throat:     Mouth: Oropharynx is clear and moist.  Eyes:     Extraocular Movements: EOM normal.     Pupils: Pupils are equal, round, and reactive to light.  Cardiovascular:     Rate and Rhythm: Normal rate and regular rhythm.     Heart sounds: Normal heart sounds.  Pulmonary:     Effort: Pulmonary effort is normal.     Breath sounds: Normal breath sounds.  Abdominal:     General: Bowel sounds are normal.     Palpations: Abdomen is soft.     Comments: Her abdomen is soft.  Her abdomen is scaphoid.  Bowel sounds are present.  She has no fluid wave.  There is no obvious abdominal mass.  There is no palpable liver or spleen tip.  Musculoskeletal:         General: No tenderness, deformity or edema. Normal range of motion.     Cervical back: Normal range of motion.     Comments: Her extremities shows a wrapping of the left lower leg.  She has muscle atrophy in upper and lower extremities.  Lymphadenopathy:     Cervical: No cervical adenopathy.  Skin:    General: Skin is warm and dry.     Findings: No erythema or rash.  Neurological:     Mental Status: She is alert and oriented to person, place, and time.  Psychiatric:  Mood and Affect: Mood and affect normal.        Behavior: Behavior normal.        Thought Content: Thought content normal.        Judgment: Judgment normal.       Lab Results  Component Value Date   WBC 7.0 09/24/2020   HGB 10.5 (L) 09/24/2020   HCT 31.8 (L) 09/24/2020   MCV 89.1 09/24/2020   PLT 197 09/24/2020   Lab Results  Component Value Date   FERRITIN 57 05/02/2018   IRON 81 05/02/2018   TIBC 367 05/02/2018   UIBC 271 04/30/2017   IRONPCTSAT 22 05/02/2018   Lab Results  Component Value Date   RBC 3.57 (L) 09/24/2020   No results found for: KPAFRELGTCHN, LAMBDASER, KAPLAMBRATIO No results found for: IGGSERUM, IGA, IGMSERUM No results found for: Odetta Pink, SPEI   Chemistry      Component Value Date/Time   NA 139 09/21/2020 0000   NA 144 04/30/2017 1134   NA 141 03/13/2017 1143   K 4.8 09/21/2020 0000   K 3.8 04/30/2017 1134   K 4.4 03/13/2017 1143   CL 102 09/21/2020 0000   CL 102 04/30/2017 1134   CO2 29 09/21/2020 0000   CO2 31 04/30/2017 1134   CO2 26 03/13/2017 1143   BUN 24 09/21/2020 0000   BUN 18 04/30/2017 1134   BUN 21.2 03/13/2017 1143   CREATININE 0.86 09/21/2020 0000   CREATININE 0.9 03/13/2017 1143      Component Value Date/Time   CALCIUM 8.8 09/21/2020 0000   CALCIUM 9.2 04/30/2017 1134   CALCIUM 9.5 03/13/2017 1143   ALKPHOS 57 03/25/2020 1322   ALKPHOS 68 04/30/2017 1134   ALKPHOS 80 03/13/2017 1143    AST 13 09/21/2020 0000   AST 14 (L) 03/25/2020 1322   AST 21 03/13/2017 1143   ALT 6 09/21/2020 0000   ALT 6 03/25/2020 1322   ALT 19 04/30/2017 1134   ALT 13 03/13/2017 1143   BILITOT 0.4 09/21/2020 0000   BILITOT 0.5 03/25/2020 1322   BILITOT 0.50 03/13/2017 1143       Impression and Plan: Kristy Olson is a very pleasant 87 yo caucasian female withhistory of stage I tubulo-lobular carcinoma of the left breast with lumpectomy in August 2017.  Again, his weight loss is incredibly troublesome to me.  I just do not know why she has this.  I cannot imagine this is anything related to breast cancer.  I really think that we can stop the Femara now.  I really don't think she needs more than 5 years.  I told her to finish up the last bottle that she has.  Hopefully, she'll have a CAT scan next week.  I know she has had a past history of melanoma.  Again I cannot imagine melanoma coming back without it being obvious on exam or with her labs.  I feel bad that she has this nonhealing wound in the left leg.  I think were going to have to get her back in 6 weeks.  There is a lot going on with her.  I do still think that 44-monthfollow-up would be appropriate and that we need to help her out and try to get her better so that she can enjoy a better quality of life.    PVolanda Napoleon MD 2/4/20222:23 PM

## 2020-09-25 LAB — CANCER ANTIGEN 27.29: CA 27.29: 21.2 U/mL (ref 0.0–38.6)

## 2020-09-27 ENCOUNTER — Other Ambulatory Visit: Payer: Self-pay

## 2020-09-27 ENCOUNTER — Ambulatory Visit (INDEPENDENT_AMBULATORY_CARE_PROVIDER_SITE_OTHER): Payer: Medicare Other

## 2020-09-27 DIAGNOSIS — N8500 Endometrial hyperplasia, unspecified: Secondary | ICD-10-CM

## 2020-09-27 DIAGNOSIS — I700 Atherosclerosis of aorta: Secondary | ICD-10-CM | POA: Diagnosis not present

## 2020-09-27 DIAGNOSIS — K862 Cyst of pancreas: Secondary | ICD-10-CM

## 2020-09-27 DIAGNOSIS — K7689 Other specified diseases of liver: Secondary | ICD-10-CM

## 2020-09-27 MED ORDER — IOHEXOL 300 MG/ML  SOLN
100.0000 mL | Freq: Once | INTRAMUSCULAR | Status: AC | PRN
  Administered 2020-09-27: 100 mL via INTRAVENOUS

## 2020-09-28 DIAGNOSIS — K862 Cyst of pancreas: Secondary | ICD-10-CM | POA: Insufficient documentation

## 2020-09-28 NOTE — Addendum Note (Signed)
Addended by: Maryla Morrow on: 09/28/2020 04:33 PM   Modules accepted: Orders

## 2020-09-30 ENCOUNTER — Other Ambulatory Visit: Payer: Self-pay | Admitting: Hematology & Oncology

## 2020-09-30 DIAGNOSIS — C73 Malignant neoplasm of thyroid gland: Secondary | ICD-10-CM

## 2020-09-30 DIAGNOSIS — C50112 Malignant neoplasm of central portion of left female breast: Secondary | ICD-10-CM

## 2020-11-02 ENCOUNTER — Other Ambulatory Visit: Payer: Self-pay | Admitting: Osteopathic Medicine

## 2020-11-02 DIAGNOSIS — I2510 Atherosclerotic heart disease of native coronary artery without angina pectoris: Secondary | ICD-10-CM

## 2020-11-12 ENCOUNTER — Inpatient Hospital Stay: Payer: Medicare Other | Attending: Hematology & Oncology

## 2020-11-12 ENCOUNTER — Inpatient Hospital Stay: Payer: Medicare Other | Admitting: Hematology & Oncology

## 2020-12-16 ENCOUNTER — Telehealth: Payer: Self-pay

## 2020-12-16 NOTE — Telephone Encounter (Signed)
Pt called stating for the past 3 weeks, she has been having a constant pain in her head. She states that it is not a headache. Pt is requesting an MRI because she does have a nodule on her head. Pls advise if provider wants patient to have an appt for an evaluation.

## 2020-12-16 NOTE — Telephone Encounter (Signed)
As always, appt needed for new symptoms / request for testing

## 2020-12-17 NOTE — Telephone Encounter (Signed)
Left voicemail for patient to call us back to make this appointment.  

## 2020-12-22 ENCOUNTER — Telehealth: Payer: Self-pay | Admitting: General Practice

## 2020-12-22 NOTE — Telephone Encounter (Signed)
Transition Care Management Unsuccessful Follow-up Telephone Call  Date of discharge and from where:  12/21/20 [facility]  Attempts:  1st Attempt  Reason for unsuccessful TCM follow-up call:  Left voice message

## 2020-12-23 NOTE — Telephone Encounter (Signed)
Transition Care Management Unsuccessful Follow-up Telephone Call  Date of discharge and from where:  12/21/20 [facility]  Attempts:  2nd Attempt  Reason for unsuccessful TCM follow-up call:  Left voice message

## 2020-12-24 NOTE — Telephone Encounter (Signed)
Transition Care Management Unsuccessful Follow-up Telephone Call  Date of discharge and from where:  12/21/20 [facility]  Attempts:  3rd Attempt  Reason for unsuccessful TCM follow-up call:  Left voice message

## 2021-04-12 ENCOUNTER — Ambulatory Visit: Payer: Medicare Other | Admitting: Osteopathic Medicine

## 2021-07-19 ENCOUNTER — Encounter: Payer: Self-pay | Admitting: Medical-Surgical

## 2021-07-19 ENCOUNTER — Ambulatory Visit (INDEPENDENT_AMBULATORY_CARE_PROVIDER_SITE_OTHER): Payer: Medicare Other | Admitting: Medical-Surgical

## 2021-07-19 ENCOUNTER — Other Ambulatory Visit: Payer: Self-pay

## 2021-07-19 VITALS — BP 127/66 | HR 63 | Resp 20 | Ht 67.0 in | Wt 120.3 lb

## 2021-07-19 DIAGNOSIS — I2510 Atherosclerotic heart disease of native coronary artery without angina pectoris: Secondary | ICD-10-CM

## 2021-07-19 DIAGNOSIS — E890 Postprocedural hypothyroidism: Secondary | ICD-10-CM

## 2021-07-19 DIAGNOSIS — I10 Essential (primary) hypertension: Secondary | ICD-10-CM

## 2021-07-19 NOTE — Progress Notes (Signed)
  HPI with pertinent ROS:   CC: transfer of care  HPI: Very pleasant 87 year old female presenting today to transfer care to a new PCP.  She lives in assisted living here in [place] and is still very functional, continuing to drive and perform her own activities of daily living.  She is currently under the care of urology as well as endocrinology to manage her chronic diseases.  Taking all prescribed medications as instructed, no side effects or significant concerns with her treatment.  Notes that she is on a new medication that is prescribed by urology and is very happy to report that it is helping with urinary incontinence.  Has a very good support network with multiple family members and friends that are available to help out should she need anything.  I reviewed the past medical history, family history, social history, surgical history, and allergies today and no changes were needed.  Please see the problem list section below in epic for further details.   Physical exam:   General: Well Developed, well nourished, and in no acute distress.  Neuro: Alert and oriented x3.  HEENT: Normocephalic, atraumatic.  Skin: Warm and dry. Cardiac: Regular rate and rhythm, no murmurs rubs or gallops, no lower extremity edema.  Respiratory: Clear to auscultation bilaterally. Not using accessory muscles, speaking in full sentences.  Impression and Recommendations:    1. Coronary artery disease without angina pectoris, unspecified vessel or lesion type, unspecified whether native or transplanted heart Continue aspirin 81 mg daily.  Continue metoprolol 12.5 mg every other day as prescribed.  2. Essential hypertension, benign Continue metoprolol 12.5 mg every other day.  3. Hypothyroidism, postsurgical Managed by endocrinology.  Continue Synthroid 150 mcg daily.  Return in about 3 months (around 10/18/2021) for chronic disease follow up. ___________________________________________ Clearnce Sorrel,  DNP, APRN, FNP-BC [facility] [place]

## 2021-08-26 DIAGNOSIS — R600 Localized edema: Secondary | ICD-10-CM | POA: Insufficient documentation

## 2021-08-26 DIAGNOSIS — R609 Edema, unspecified: Secondary | ICD-10-CM | POA: Insufficient documentation

## 2021-09-05 ENCOUNTER — Encounter: Payer: Self-pay | Admitting: Emergency Medicine

## 2021-09-05 ENCOUNTER — Other Ambulatory Visit: Payer: Self-pay

## 2021-09-05 ENCOUNTER — Emergency Department (INDEPENDENT_AMBULATORY_CARE_PROVIDER_SITE_OTHER): Payer: Medicare Other

## 2021-09-05 ENCOUNTER — Emergency Department
Admission: EM | Admit: 2021-09-05 | Discharge: 2021-09-05 | Disposition: A | Discharge status: Home / Self Care | Payer: Medicare Other | Source: Home / Self Care | Attending: Family Medicine | Admitting: Family Medicine

## 2021-09-05 DIAGNOSIS — Y92009 Unspecified place in unspecified non-institutional (private) residence as the place of occurrence of the external cause: Secondary | ICD-10-CM | POA: Diagnosis not present

## 2021-09-05 DIAGNOSIS — S20212A Contusion of left front wall of thorax, initial encounter: Secondary | ICD-10-CM

## 2021-09-05 DIAGNOSIS — W19XXXA Unspecified fall, initial encounter: Secondary | ICD-10-CM

## 2021-09-05 DIAGNOSIS — R0781 Pleurodynia: Secondary | ICD-10-CM | POA: Diagnosis not present

## 2021-09-05 NOTE — ED Triage Notes (Signed)
Patient presents to Urgent Care with complaints of left sided pain/rib since 5 days ago. Patient reports she caught herself from falling and hit her left side/rib area on the door frame at her home. Have not taken any medication for the pain. Has been resting. Pain with a deep breathe, movement, sneeze, or cough.

## 2021-09-05 NOTE — Discharge Instructions (Signed)
Take Tylenol for pain Call your doctor if not improving over the next week or 2

## 2021-09-05 NOTE — ED Provider Notes (Signed)
[name]    CSN: [id number] Arrival date & time: 09/05/21  1637      History   Chief Complaint Chief Complaint  Patient presents with   Rib Injury    Left side    HPI Kristy Olson is a 88 y.o. female.   HPI  Patient has had another fall in her home.  She slipped in her kitchen and fell into a doorway.  Her left ribs hit the door frame.  She states that her ribs have been very painful.  They are painful even at rest.  They hurt more with movement, deep breath cough or sneeze.  No fever.  No sputum production.  Past Medical History:  Diagnosis Date   Abnormal finding on MRI of brain    Breast cancer of upper-inner quadrant of left female breast ([place]) 10/12/2015   CAD (coronary artery disease)    Chronic venous insufficiency    Compression stockings, Dr. Carmine Savoy to evaluate late May 2015    Degeneration of lumbar or lumbosacral intervertebral disc    History of thyroid cancer    With reported brain mets.    Hyperlipidemia    Hypothyroid    Outside records report synthroid 136mcg despite her report of 143mcg. 02/2014 awaiting clarification    Idiopathic scoliosis    Osteoporosis    July 2014 DEXA    PNEUMONIA, COMMUNITY ACQUIRED, PNEUMOCOCCAL    Venous stasis ulcer ([place])     Patient Active Problem List   Diagnosis Date Noted   Pancreas cyst - needs follow up CT 09/2022 (see CT 09/2020) 09/28/2020   Melanoma ([place]) 08/17/2017   Mohs defect 08/17/2017   Decreased thyroid stimulating hormone (TSH) level 07/19/2016   Filamentary keratitis of right eye 06/26/2016   Keratoconjunctivitis sicca due to decreased tear production, bilateral 06/26/2016   Blepharitis of both eyes 06/26/2016   Atopic conjunctivitis of both eyes 06/26/2016   Cerumen impaction 04/17/2016   Hair loss 04/17/2016   Breast cancer of upper-inner quadrant of left female breast ([place]) 10/12/2015   Rhinorrhea 12/15/2014   Chest pain 12/02/2014   Hypothyroidism, postsurgical 07/21/2014    CAD (coronary artery disease) 04/29/2014   Abnormal finding on MRI of brain 04/14/2014   Malignant neoplasm of thyroid gland ([place]) 04/14/2014   History of thyroid cancer 03/27/2014   Chronic venous insufficiency 12/23/2013   Degeneration of lumbar or lumbosacral intervertebral disc 10/08/2013   Idiopathic scoliosis 10/08/2013   Osteoporosis 10/06/2013   Essential hypertension, benign 08/18/2013   Venous stasis ulcer of left lower extremity ([place]) 08/06/2013   Grief 08/06/2013   Elevated triglycerides with high cholesterol 08/15/2010    Past Surgical History:  Procedure Laterality Date   BREAST LUMPECTOMY WITH RADIOACTIVE SEED LOCALIZATION Left 03/29/2016   Procedure: LEFT BREAST LUMPECTOMY WITH RADIOACTIVE SEED LOCALIZATION;  Surgeon: Autumn Messing III, MD;  Location: [facility];  Service: General;  Laterality: Left;  LEFT BREAST LUMPECTOMY WITH RADIOACTIVE SEED LOCALIZATION   cranotomy     THYROIDECTOMY      OB History   No obstetric history on file.      Home Medications    Prior to Admission medications   Medication Sig Start Date End Date Taking? Authorizing Provider  aspirin 81 MG tablet Take 81 mg by mouth daily.   Yes [provider]  metoprolol succinate (TOPROL-XL) 25 MG 24 hr tablet TAKE 1 TABLET(25 MG) BY MOUTH DAILY 11/02/20  Yes Emeterio Reeve, DO  SYNTHROID 150 MCG tablet TAKE 1  TABLET(150 MCG) BY MOUTH DAILY 09/13/20  Yes Early, Coralee Pesa, NP  Vibegron (GEMTESA) 75 MG TABS Take 1 tablet by mouth daily. 07/27/21  Yes [provider]    Family History Family History  Problem Relation Age of Onset   CAD Sister    Cancer Sister    Heart disease Sister    Diabetes Father    COPD Father     Social History Social History   Tobacco Use   Smoking status: Never   Smokeless tobacco: Never  Vaping Use   Vaping Use: Never used  Substance Use Topics   Alcohol use: Yes    Alcohol/week: 0.0 standard drinks    Comment: Occasional    Drug use: No     Allergies   Neomy-bacit-polymyx-pramoxine, Amoxicillin, Doxycycline, Levofloxacin, Statins, Colesevelam, Neomycin, Red dye, and Tape   Review of Systems Review of Systems See HPI  Physical Exam Triage Vital Signs ED Triage Vitals  Enc Vitals Group     BP 09/05/21 1653 137/69     Pulse Rate 09/05/21 1653 (!) 55     Resp 09/05/21 1653 18     Temp 09/05/21 1653 98 F (36.7 C)     Temp Source 09/05/21 1653 Oral     SpO2 09/05/21 1653 98 %     Weight 09/05/21 1655 125 lb (56.7 kg)     Height --      Head Circumference --      Peak Flow --      Pain Score 09/05/21 1650 8     Pain Loc --      Pain Edu? --      Excl. in [place]? --    No data found.  Updated Vital Signs BP 137/69 (BP Location: Right Arm)    Pulse (!) 55    Temp 98 F (36.7 C) (Oral)    Resp 18    Wt 56.7 kg    SpO2 98%    BMI 19.58 kg/m   Physical Exam Constitutional:      General: She is not in acute distress.    Appearance: She is well-developed.     Comments: Very thin/cachectic.  Appears uncomfortable  HENT:     Head: Normocephalic and atraumatic.     Mouth/Throat:     Comments: Mask is in place Eyes:     Conjunctiva/sclera: Conjunctivae normal.     Pupils: Pupils are equal, round, and reactive to light.  Cardiovascular:     Rate and Rhythm: Normal rate.  Pulmonary:     Effort: Pulmonary effort is normal. No respiratory distress.     Breath sounds: Normal breath sounds. No wheezing, rhonchi or rales.  Chest:    Abdominal:     General: There is no distension.     Palpations: Abdomen is soft.  Musculoskeletal:        General: Normal range of motion.     Cervical back: Normal range of motion.  Skin:    General: Skin is warm and dry.  Neurological:     Mental Status: She is alert.  Psychiatric:        Mood and Affect: Mood normal.        Behavior: Behavior normal.     UC Treatments / Results  Labs (all labs ordered are listed, but only abnormal results are  displayed) Labs Reviewed - No data to display  EKG   Radiology DG Ribs Unilateral W/Chest Left  Result Date: 09/05/2021 CLINICAL DATA:  Status  post fall with pain in the left lower ribs. EXAM: LEFT RIBS AND CHEST - 3+ VIEW COMPARISON:  September 14, 2014 FINDINGS: No fracture or dislocations are seen involving the ribs. Chronic deformity of the right clavicle is unchanged. There is no evidence of pneumothorax or pleural effusion. Both lungs are clear. Heart size and mediastinal contours are within normal limits. IMPRESSION: No acute fracture or dislocation of left ribs. Electronically Signed   By: Abelardo Diesel M.D.   On: 09/05/2021 17:31    Procedures Procedures (including critical care time)  Medications Ordered in UC Medications - No data to display  Initial Impression / Assessment and Plan / UC Course  I have reviewed the triage vital signs and the nursing notes.  Pertinent labs & imaging results that were available during my care of the patient were reviewed by me and considered in my medical decision making (see chart for details).  I offered patient pain medication but had a discussion with her that our was fearful it would increase her unsteadiness and dizziness, and increase her risk of falls.  She has already had a couple of falls this year.  She has not tried any home medication.  We decided she would start with Tylenol morning and night and call if she fails to improve Final Clinical Impressions(s) / UC Diagnoses   Final diagnoses:  Rib contusion, left, initial encounter  Fall in home, initial encounter     Discharge Instructions      Take Tylenol for pain Call your doctor if not improving over the next week or 2     ED Prescriptions   None    PDMP not reviewed this encounter.   Raylene Everts, MD 09/05/21 [phone]

## 2021-09-12 ENCOUNTER — Other Ambulatory Visit: Payer: Self-pay

## 2021-09-12 ENCOUNTER — Encounter: Payer: Medicare Other | Admitting: Medical-Surgical

## 2021-09-12 ENCOUNTER — Telehealth: Payer: Self-pay | Admitting: General Practice

## 2021-09-12 ENCOUNTER — Encounter: Payer: Self-pay | Admitting: Medical-Surgical

## 2021-09-12 ENCOUNTER — Ambulatory Visit (INDEPENDENT_AMBULATORY_CARE_PROVIDER_SITE_OTHER): Payer: Medicare Other | Admitting: Medical-Surgical

## 2021-09-12 VITALS — BP 142/63 | HR 63 | Temp 97.8°F | Ht 67.0 in | Wt 123.0 lb

## 2021-09-12 DIAGNOSIS — S2242XD Multiple fractures of ribs, left side, subsequent encounter for fracture with routine healing: Secondary | ICD-10-CM | POA: Diagnosis not present

## 2021-09-12 MED ORDER — ACETAMINOPHEN 325 MG PO TABS
500.0000 mg | ORAL_TABLET | Freq: Once | ORAL | Status: AC
Start: 2021-09-12 — End: 2021-09-12
  Administered 2021-09-12: 487.5 mg via ORAL

## 2021-09-12 MED ORDER — ACETAMINOPHEN 325 MG PO TABS: 650.0000 mg | ORAL_TABLET | Freq: Three times a day (TID) | ORAL | 1 refills | Status: DC | PRN

## 2021-09-12 MED ORDER — TRAMADOL HCL 50 MG PO TABS: 50.0000 mg | ORAL_TABLET | Freq: Two times a day (BID) | ORAL | 0 refills | Status: DC | PRN

## 2021-09-12 NOTE — Telephone Encounter (Signed)
Transition Care Management Unsuccessful Follow-up Telephone Call  Date of discharge and from where:  09/10/21 from [facility]  Attempts:  1st Attempt  Reason for unsuccessful TCM follow-up call:  Unable to reach patient

## 2021-09-12 NOTE — Progress Notes (Signed)
This encounter was created in error - please disregard.

## 2021-09-12 NOTE — Progress Notes (Signed)
°  HPI with pertinent ROS:   CC: Rib pain  HPI: Pleasant 88 year old female presenting today after experiencing a fall approximately a week and 1/2 to 2 weeks ago.  She notes that she fell against a door frame and hit the left side of her ribs.  She did not seek out medical care for almost a week and only then, went to urgent care due to severe pain.  After going to urgent care, she ended up at the hospital as her pain was not improving.  Today she presents reporting that the muscle relaxer and lidocaine patches that she was given at the hospital have not been helpful for managing her pain.  She was taking Tylenol but has run out of this.  Wonders what she can do to help with her discomfort as she is not comfortable in any position including sitting, lying, or standing.  Notes that the only position she is comfortable and is bent over at the waist with her arms hanging down.  The pain is located on the left side of her thoracic spine and is interfering with her regular activities.  No shortness of breath noted  I reviewed the past medical history, family history, social history, surgical history, and allergies today and no changes were needed.  Please see the problem list section below in epic for further details.  Physical exam:   General: Well Developed, well nourished, and in no acute distress.  Neuro: Alert and oriented x3.  HEENT: Normocephalic, atraumatic.  Skin: Warm and dry. Cardiac: Regular rate and rhythm, no murmurs rubs or gallops, no lower extremity edema.  Respiratory: Clear to auscultation bilaterally. Not using accessory muscles, speaking in full sentences.  Impression and Recommendations:    1. Closed fracture of multiple ribs of left side with routine healing, subsequent encounter Tylenol given in office today.  Sending in prescriptions for Tylenol 650mg  every 8 hours as needed and Tramadol 50mg  every 12 hours as needed. Ok to continue using Lidocaine patches and Robaxin as  prescribed. Discussed pain control and the time it takes to heal rib fractures barring further injury.  - acetaminophen (TYLENOL) tablet 487.5 mg  Return in about 2 weeks (around 09/26/2021) for rib fracture follow up. ___________________________________________ Clearnce Sorrel, DNP, APRN, FNP-BC [facility] [place]

## 2021-09-12 NOTE — Patient Instructions (Signed)
Take Tramadol 50mg  twice daily as needed for pain.  Take Tylenol 650mg  every 8 hours as needed for pain.   Continue muscle relaxer as prescribed.   Use lidocaine patches as prescribed.   Consider using heat or ice for comfort. Avoid using over lidocaine patch.

## 2021-09-13 ENCOUNTER — Other Ambulatory Visit: Payer: Self-pay

## 2021-09-13 MED ORDER — TRAMADOL HCL 50 MG PO TABS: 50.0000 mg | ORAL_TABLET | Freq: Two times a day (BID) | ORAL | 0 refills | Status: DC | PRN

## 2021-09-13 MED ORDER — TRAMADOL HCL 50 MG PO TABS
50.0000 mg | ORAL_TABLET | Freq: Two times a day (BID) | ORAL | 0 refills | Status: DC | PRN
Start: 2021-09-13 — End: 2021-09-13

## 2021-09-13 MED ORDER — TRAMADOL HCL 50 MG PO TABS: 50.0000 mg | ORAL_TABLET | Freq: Two times a day (BID) | ORAL | 0 refills | Status: AC | PRN

## 2021-09-13 NOTE — Telephone Encounter (Signed)
Sent electronically. Thanks for catching that.   ___________________________________________ Clearnce Sorrel, DNP, APRN, FNP-BC [facility] [place]

## 2021-09-13 NOTE — Telephone Encounter (Signed)
Please make sure to cancel the prescription that was originally sent. Apologies for the incorrect pharmacy.

## 2021-09-13 NOTE — Telephone Encounter (Signed)
Pt and pharmacy called stating tramadol 50 mg Rx was sent to the wrong pharmacy. It should go to [facility].  Rx has been tee'd up below and ready for review and approval/denial.

## 2021-09-13 NOTE — Telephone Encounter (Signed)
Prescription was printed to an unknown location and not transmitted electronically.

## 2021-09-14 NOTE — Telephone Encounter (Signed)
Transition Care Management Follow-up Telephone Call Date of discharge and from where: 09/10/21 from [facility] How have you been since you were released from the hospital? Patient had appointment with PCP on 09/12/21. Any questions or concerns? No

## 2021-10-10 ENCOUNTER — Other Ambulatory Visit: Payer: Self-pay | Admitting: Medical-Surgical

## 2021-10-10 DIAGNOSIS — I2510 Atherosclerotic heart disease of native coronary artery without angina pectoris: Secondary | ICD-10-CM

## 2021-10-10 NOTE — Telephone Encounter (Signed)
[facility] requesting refill for metoprolol succinate (TOPROL-XL) 25 MG 24 hr tablet Written by a historic provider

## 2021-10-18 ENCOUNTER — Ambulatory Visit: Payer: Medicare Other | Admitting: Medical-Surgical

## 2022-01-01 ENCOUNTER — Emergency Department: Admit: 2022-01-01 | Payer: MEDICARE | Primary: Internal Medicine

## 2022-01-01 ENCOUNTER — Inpatient Hospital Stay
Admit: 2022-01-01 | Discharge: 2022-01-01 | Disposition: A | Discharge status: Home / Self Care | Payer: MEDICARE | Attending: Emergency Medicine

## 2022-01-01 DIAGNOSIS — R1013 Epigastric pain: Secondary | ICD-10-CM

## 2022-01-01 LAB — EKG 12-LEAD
P Axis: 65 degrees
P-R Interval: 178 ms
Q-T Interval: 378 ms
QRS Duration: 78 ms
QTc Calculation (Bazett): 370 ms
R Axis: 56 degrees
T Axis: 87 degrees
Ventricular Rate: 56 {beats}/min

## 2022-01-01 LAB — TROPONIN: Troponin T: <0.01 ng/mL (ref 0.000–0.010)

## 2022-01-01 LAB — COMPREHENSIVE METABOLIC PANEL
ALT: 13 U/L (ref 0–35)
AST: 24 U/L (ref 0–35)
Albumin/Globulin Ratio: 2 (ref 1.00–2.70)
Albumin: 4.1 g/dL (ref 3.5–5.2)
Alk Phosphatase: 80 U/L (ref 35–117)
Anion Gap: 11 mmol/L (ref 2–17)
BUN: 16 mg/dL (ref 8–23)
CO2: 26 mmol/L (ref 22–29)
Calcium: 9 mg/dL (ref 8.8–10.2)
Chloride: 101 mmol/L (ref 98–107)
Creatinine: 0.7 mg/dL (ref 0.5–1.0)
Est, Glom Filt Rate: 83 mL/min/1.73m (ref >=60)
Globulin: 2 g/dL (ref 1.9–4.4)
Glucose: 92 mg/dL (ref 70–99)
OSMOLALITY CALCULATED: 276 mOsm/kg (ref 270–287)
Potassium: 4.9 mmol/L (ref 3.5–5.3)
Sodium: 138 mmol/L (ref 135–145)
Total Bilirubin: 0.38 mg/dL (ref 0.00–1.20)
Total Protein: 6.4 g/dL (ref 6.4–8.3)

## 2022-01-01 LAB — BRAIN NATRIURETIC PEPTIDE: NT Pro-BNP: 411 pg/mL (ref 0–450)

## 2022-01-01 LAB — URINALYSIS W/ RFLX MICROSCOPIC
Bilirubin Urine: NEGATIVE
Blood, Urine: NEGATIVE
Glucose, UA: NEGATIVE
Ketones, Urine: NEGATIVE
Leukocyte Esterase, Urine: NEGATIVE
Nitrite, Urine: NEGATIVE
Protein, UA: NEGATIVE
Specific Gravity, UA: 1.01 (ref 1.003–1.035)
Urobilinogen, Urine: 0.2 EU/dL
pH, UA: 6.5 (ref 4.5–8.0)

## 2022-01-01 LAB — CBC WITH AUTO DIFFERENTIAL
Absolute Baso #: 0 10*3/uL (ref 0.0–0.2)
Absolute Eos #: 0.1 10*3/uL (ref 0.0–0.5)
Absolute Lymph #: 1.3 10*3/uL (ref 1.0–3.2)
Absolute Mono #: 0.6 10*3/uL (ref 0.3–1.0)
Basophils %: 0.4 % (ref 0.0–2.0)
Eosinophils %: 1.5 % (ref 0.0–7.0)
Hematocrit: 35.9 % (ref 34.0–47.0)
Hemoglobin: 11.6 g/dL (ref 11.5–15.7)
Immature Grans (Abs): 0.01 10*3/uL (ref 0.00–0.06)
Immature Granulocytes: 0.1 % (ref 0.0–0.6)
Lymphocytes: 17.5 % (ref 15.0–45.0)
MCH: 30.9 pg (ref 27.0–34.5)
MCHC: 32.3 g/dL (ref 32.0–36.0)
MCV: 95.5 fL (ref 81.0–99.0)
MPV: 10.8 fL (ref 7.2–13.2)
Monocytes: 8 % (ref 4.0–12.0)
Neutrophils %: 72.5 % (ref 42.0–74.0)
Neutrophils Absolute: 5.4 10*3/uL (ref 1.6–7.3)
Platelets: 171 10*3/uL (ref 140–440)
RBC: 3.76 x10e6/mcL (ref 3.60–5.20)
RDW: 13.2 % (ref 11.0–16.0)
WBC: 7.4 10*3/uL (ref 3.8–10.6)

## 2022-01-01 LAB — LIPASE: Lipase: 35 U/L (ref 13–60)

## 2022-01-01 MED ORDER — ONDANSETRON HCL 4 MG PO TABS
4 MG | ORAL_TABLET | Freq: Three times a day (TID) | ORAL | 0 refills | Status: AC | PRN
Start: 2022-01-01 — End: ?

## 2022-01-01 MED ORDER — IOPAMIDOL 61 % IV SOLN
61 % | Freq: Once | INTRAVENOUS | Status: AC | PRN
Start: 2022-01-01 — End: 2022-01-01
  Administered 2022-01-01: 16:00:00 100 mL via INTRAVENOUS

## 2022-01-01 MED ORDER — SODIUM CHLORIDE 0.9 % IV BOLUS
0.9 % | Freq: Once | INTRAVENOUS | Status: AC
Start: 2022-01-01 — End: 2022-01-01
  Administered 2022-01-01: 14:00:00 1000 mL via INTRAVENOUS

## 2022-01-01 MED ORDER — ONDANSETRON HCL 4 MG/2ML IJ SOLN
4 MG/2ML | Freq: Once | INTRAMUSCULAR | Status: AC
Start: 2022-01-01 — End: 2022-01-01
  Administered 2022-01-01: 14:00:00 4 mg via INTRAVENOUS

## 2022-01-01 MED FILL — ONDANSETRON HCL 4 MG/2ML IJ SOLN: 4 MG/2ML | INTRAMUSCULAR | Qty: 2

## 2022-01-01 NOTE — ED Provider Notes (Signed)
[facility] DE[facility]  EMERGENCY DEPARTMENT ENCOUNTER      Pt Name: Gina Serrano  MRN: [id number]  Birthdate Nov 28, 1931  Date of evaluation: 01/01/2022  Provider: Rocky Link, MD    CHIEF COMPLAINT       Chief Complaint   Patient presents with    Abdominal Pain     'core hurting' 'middle of body' epigastric pain woke pt at 5am. Had large bowel of diarrhea with chills.          HISTORY OF PRESENT ILLNESS   (Location/Symptom, Timing/Onset, Context/Setting, Quality, Duration, Modifying Factors, Severity)  Note limiting factors.   Gina Serrano is a 88 y.o. female who presents to the emergency department with abdominal pain    HPI  87 year old female patient who is presenting with epigastric abdominal pain that started this morning, associated with nausea and diarrhea, nothing makes it better, nothing makes it worse, resolving spontaneously, currently is mild in severity, not traveling anywhere.  No chest pain no shortness of breath no cough no dysuria no hematuria no flank pain no headache no fever no any other complaints  Nursing Notes were reviewed.    REVIEW OF SYSTEMS    (2-9 systems for level 4, 10 or more for level 5)     Review of Systems    Except as noted above the remainder of the review of systems was reviewed and negative.       PAST MEDICAL HISTORY   No past medical history on file.      SURGICAL HISTORY     No past surgical history on file.      CURRENT MEDICATIONS       Previous Medications    No medications on file       ALLERGIES     Neomy-bacit-polymyx-pramoxine, Red dye, Amoxicillin, Doxycycline, Levofloxacin, Metoprolol, Statins, Adhesive tape, Colesevelam, and Neomycin    FAMILY HISTORY     No family history on file.       SOCIAL HISTORY       Social History     Socioeconomic History    Marital status: Married       SCREENINGS         Glasgow Coma Scale  Eye Opening: Spontaneous  Best Verbal Response: Oriented  Best Motor Response: Obeys commands  Glasgow Coma Scale Score: 15                      CIWA Assessment  BP: (!) 143/78  Pulse: 61                 PHYSICAL EXAM    (up to 7 for level 4, 8 or more for level 5)     ED Triage Vitals [01/01/22 0936]   BP Temp Temp src Pulse Respirations SpO2 Height Weight - Scale   (!) 142/70 97.7 F (36.5 C) -- 74 16 97 % 5\' 7"  (1.702 m) 120 lb (54.4 kg)       Physical Exam  General appearance: Nontoxic-appearing, in no acute distress  Head: Normocephalic, atraumatic  ENT: Normal external exam of the nose and ears, no evidence of trauma  Neck: Atraumatic, no stridor, no gross adenopathy  Respiratory: In no acute distress, clear to auscultation bilaterally, no wheezing, no crackles  Cardiovascular: Hemodynamically stable, intact pulses all over, intact cap refill all over  Abdomen: Unremarkable inspection, soft, mild diffuse tenderness to palpation, nondistended, normoactive bowel sounds, no rebound no guarding  Musculoskeletal: Atraumatic, no deformity, intact  neurovascular examination All over  CNS: Grossly nonfocal exam, normal speech  Skin: Warm and dry  Psych: Calm and cooperative    DIAGNOSTIC RESULTS     EKG: All EKG's are interpreted by the Emergency Department Physician who either signs or Co-signs this chart in the absence of a cardiologist.    Sinus bradycardia, no ST elevation, no ST depression, ventricular rate of 56, QRS 78, QTc 370, unremarkable EKG    RADIOLOGY:   Non-plain film images such as CT, Ultrasound and MRI are read by the radiologist. Plain radiographic images are visualized and preliminarily interpreted by the emergency physician with the below findings:        Interpretation per the Radiologist below, if available at the time of this note:    CT ABDOMEN PELVIS W IV CONTRAST Additional Contrast? None   Final Result   Impression:        1. Correlate for cystitis.   2. Subsegmental bronchiolitis or atelectasis right middle lobe.         If you have questions regarding this report, please feel free to call me    directly using  [employer].      This note was created using voice recognition software and may contain    typographic errors missed during final review. The intent is to have a complete    and accurate medical record. ?As a valued partner in this safety effort, if you    have noted factual errors, please complete the Health Information    Amendment/Correct Form or call the [facility] Health Information Management Office at    [phone].               ED BEDSIDE ULTRASOUND:   Performed by ED Physician - none    LABS:  Labs Reviewed   CBC WITH AUTO DIFFERENTIAL   COMPREHENSIVE METABOLIC PANEL   LIPASE   TROPONIN   BRAIN NATRIURETIC PEPTIDE   URINALYSIS W/ RFLX MICROSCOPIC       All other labs were within normal range or not returned as of this dictation.    EMERGENCY DEPARTMENT COURSE and DIFFERENTIAL DIAGNOSIS/MDM:   Vitals:    Vitals:    01/01/22 0936 01/01/22 1130 01/01/22 1131   BP: (!) 142/70  (!) 143/78   Pulse: 74 61 61   Resp: 16 16 16    Temp: 97.7 F (36.5 C)     SpO2: 97%  95%   Weight: 54.4 kg     Height: 5\' 7"  (1.702 m)             Medical Decision Making  Amount and/or Complexity of Data Reviewed  Labs: ordered.  Radiology: ordered.  ECG/medicine tests: ordered.    Risk  Prescription drug management.      Patient is presenting to be evaluated for epigastric abdominal pain that is resolving, associated with nausea and diarrhea, looks well and in no acute distress, reassuring vital signs and reassuring physical exam, EKG is nonischemic, negative troponin and normal BNP, labs are all within normal limits with normal CBC, normal CMP, normal urinalysis and normal lipase level.  CT abdomen pelvis did not reveal any acute abnormalities.  Incidental finding of possible atelectasis versus bronchiolitis were discussed with the patient, patient does not have any respiratory symptoms and this could be all part of the possible viral infection that is causing gastroenteritis symptoms.  Patient improved and stable at the time of  discharge, return precautions were explained, follow-up with PCP was advised.  Differential diagnosis for initial presentation was broad and that included viral gastroenteritis as the most likely etiology.  Considered ACS and this appears to be highly unlikely in the setting of nonischemic EKG, negative troponin, negative BNP.  Considered small bowel obstruction, perforated viscus, cholecystitis, vascular processes like AAA, aortic dissection, etc. all appear to be highly unlikely in the setting of negative CT abdomen pelvis for any acute process.  Patient is highly likely to benefit from any additional work-up, patient will be discharged on Zofran, improved, asymptomatic and stable at time of discharge.       REASSESSMENT     ED Course as of 01/01/22 1315   Sun Jan 01, 2022   1259 Patient reports feeling a lot better, no abdominal pain, no nausea, results explained, questions were answered.  Stable for discharge.  Will discharge. [AA]      ED Course User Index  [AA] Rocky LinkAbdullah Khalifah Harvey Lingo, MD           FINAL IMPRESSION      1. Abdominal pain, epigastric    2. Nausea          DISPOSITION/PLAN   DISPOSITION Decision To Discharge 01/01/2022 12:58:01 PM      PATIENT REFERRED TO:  Nicanor BakeMark Ende, MD  [address]  [place] [id number]  [phone]    Schedule an appointment as soon as possible for a visit in 2 days      [facility]  [address]  [place] [id number]  [phone]    If symptoms worsen      DISCHARGE MEDICATIONS:  New Prescriptions    ONDANSETRON (ZOFRAN) 4 MG TABLET    Take 1 tablet by mouth 3 times daily as needed for Nausea or Vomiting     Controlled Substances Monitoring:     No flowsheet data found.    (Please note that portions of this note were completed with a voice recognition program.  Efforts were made to edit the dictations but occasionally words are mis-transcribed.)    Rocky LinkAbdullah Khalifah Lerline Valdivia, MD (electronically signed)  Attending Emergency  Physician            Rocky LinkAbdullah Khalifah Cabela Pacifico, MD  01/01/22 1315

## 2022-01-01 NOTE — ED Notes (Signed)
Attempted to contact pt concerning er visit yesterday; left message to call back if she has any questions; she may use this phone # today for call back     Ralene Bathe, RN  01/02/22 1353

## 2022-01-01 NOTE — ED Notes (Signed)
No acute distress noted. Pt with son.      Demetra Shiner, RN  01/01/22 [phone]

## 2022-01-03 ENCOUNTER — Emergency Department
Admission: EM | Admit: 2022-01-03 | Discharge: 2022-01-03 | Disposition: A | Discharge status: Other Acute Inpatient Hospital | Payer: Medicare Other | Source: Home / Self Care

## 2022-01-03 DIAGNOSIS — L03116 Cellulitis of left lower limb: Secondary | ICD-10-CM

## 2022-01-03 DIAGNOSIS — R2242 Localized swelling, mass and lump, left lower limb: Secondary | ICD-10-CM

## 2022-01-03 NOTE — ED Provider Notes (Signed)
?[place] ? ? ? ?CSN: [id number] ?Arrival date & time: 01/03/22  1546 ? ? ?  ? ?History   ?Chief Complaint ?Chief Complaint  ?Patient presents with  ? Leg Swelling  ? ? ?HPI ?Kristy Olson is a 88 y.o. female.  ? ?HPI is an 88 year old female presents with left leg swelling and redness for a few weeks.  PMH significant for CAD, breast cancer, venous stasis ulcer and chronic venous insufficiency. ? ?Past Medical History:  ?Diagnosis Date  ? Abnormal finding on MRI of brain   ? Breast cancer of upper-inner quadrant of left female breast ([place]) 10/12/2015  ? CAD (coronary artery disease)   ? Chronic venous insufficiency   ? Compression stockings, Dr. Carmine Savoy to evaluate late May 2015   ? Degeneration of lumbar or lumbosacral intervertebral disc   ? History of thyroid cancer   ? With reported brain mets.   ? Hyperlipidemia   ? Hypothyroid   ? Outside records report synthroid 133mg despite her report of 1082m. 02/2014 awaiting clarification   ? Idiopathic scoliosis   ? Osteoporosis   ? July 2014 DEXA   ? PNEUMONIA, COMMUNITY ACQUIRED, PNEUMOCOCCAL   ? Venous stasis ulcer ([facility]  ? ? ?Patient Active Problem List  ? Diagnosis Date Noted  ? Pancreas cyst - needs follow up CT 09/2022 (see CT 09/2020) 09/28/2020  ? Melanoma ([facility]/28/2018  ? Mohs defect 08/17/2017  ? Decreased thyroid stimulating hormone (TSH) level 07/19/2016  ? Filamentary keratitis of right eye 06/26/2016  ? Keratoconjunctivitis sicca due to decreased tear production, bilateral 06/26/2016  ? Blepharitis of both eyes 06/26/2016  ? Atopic conjunctivitis of both eyes 06/26/2016  ? Cerumen impaction 04/17/2016  ? Hair loss 04/17/2016  ? Breast cancer of upper-inner quadrant of left female breast ([facility]02/21/2017  ? Rhinorrhea 12/15/2014  ? Chest pain 12/02/2014  ? Hypothyroidism, postsurgical 07/21/2014  ? CAD (coronary artery disease) 04/29/2014  ? Abnormal finding on MRI of brain 04/14/2014  ? Malignant neoplasm of thyroid gland ([facility] 04/14/2014  ? History of thyroid cancer 03/27/2014  ? Chronic venous insufficiency 12/23/2013  ? Degeneration of lumbar or lumbosacral intervertebral disc 10/08/2013  ? Idiopathic scoliosis 10/08/2013  ? Osteoporosis 10/06/2013  ? Essential hypertension, benign 08/18/2013  ? Venous stasis ulcer of left lower extremity ([facility]12/17/2014  ? Grief 08/06/2013  ? Elevated triglycerides with high cholesterol 08/15/2010  ? ? ?Past Surgical History:  ?Procedure Laterality Date  ? BREAST LUMPECTOMY WITH RADIOACTIVE SEED LOCALIZATION Left 03/29/2016  ? Procedure: LEFT BREAST LUMPECTOMY WITH RADIOACTIVE SEED LOCALIZATION;  Surgeon: PaAutumn MessingII, MD;  [facility];  Laterality: Left;  LEFT BREAST LUMPECTOMY WITH RADIOACTIVE SEED LOCALIZATION  ? cranotomy    ? THYROIDECTOMY    ? ? ?OB History   ?No obstetric history on file. ?  ? ? ? ?Home Medications   ? ?Prior to Admission medications   ?Medication Sig Start Date End Date Taking? Authorizing Provider  ?acetaminophen (TYLENOL) 325 MG tablet Take 2 tablets (650 mg total) by mouth every 8 (eight) hours as needed. 09/12/21   JeSamuel BoucheNP  ?aspirin 81 MG tablet Take 81 mg by mouth daily.    [provider]  ?methocarbamol (ROBAXIN) 500 MG tablet Take 500 mg by mouth 2 (two) times daily.    [provider]  ?metoprolol succinate (TOPROL-XL) 25 MG 24 hr tablet TAKE ONE HALF TABLET (12.'5MG'$ ) BY MOUTH EVERY OTHER DAY 10/10/21  Samuel Bouche, NP  ?SYNTHROID 150 MCG tablet TAKE 1 TABLET(150 MCG) BY MOUTH DAILY 09/13/20   Early, Coralee Pesa, NP  ?Vibegron (GEMTESA) 75 MG TABS Take 1 tablet by mouth daily. 07/27/21   [provider]  ? ? ?Family History ?Family History  ?Problem Relation Age of Onset  ? CAD Sister   ? Cancer Sister   ? Heart disease Sister   ? Diabetes Father   ? COPD Father   ? ? ?Social History ?Social History  ? ?Tobacco Use  ? Smoking status: Never  ? Smokeless tobacco: Never  ?Vaping Use  ? Vaping Use: Never  used  ?Substance Use Topics  ? Alcohol use: Yes  ?  Alcohol/week: 0.0 standard drinks  ?  Comment: Occasional  ? Drug use: No  ? ? ? ?Allergies   ?Neomy-bacit-polymyx-pramoxine, Amoxicillin, Doxycycline, Levofloxacin, Statins, Colesevelam, Neomycin, Red dye, and Tape ? ? ?Review of Systems ?Review of Systems  ?Skin:   ?     Left lower leg redness/swelling x 3 weeks  ? ? ?Physical Exam ?Triage Vital Signs ?ED Triage Vitals  ?Enc Vitals Group  ?   BP 01/03/22 1600 116/71  ?   Pulse Rate 01/03/22 1600 80  ?   Resp 01/03/22 1600 16  ?   Temp 01/03/22 1600 98.7 ?F (37.1 ?C)  ?   Temp Source 01/03/22 1600 Oral  ?   SpO2 --   ?   Weight --   ?   Height --   ?   Head Circumference --   ?   Peak Flow --   ?   Pain Score 01/03/22 1553 4  ?   Pain Loc --   ?   Pain Edu? --   ?   Excl. in [place]? --   ? ?No data found. ? ?Updated Vital Signs ?BP 116/71 (BP Location: Right Arm)   Pulse 80   Temp 98.7 ?F (37.1 ?C) (Oral)   Resp 16  ? ?  ? ?Physical Exam ?Vitals and nursing note reviewed.  ?Constitutional:   ?   Appearance: Normal appearance. She is normal weight.  ?HENT:  ?   Head: Normocephalic and atraumatic.  ?   Mouth/Throat:  ?   Mouth: Mucous membranes are moist.  ?   Pharynx: Oropharynx is clear.  ?Eyes:  ?   Extraocular Movements: Extraocular movements intact.  ?   Conjunctiva/sclera: Conjunctivae normal.  ?   Pupils: Pupils are equal, round, and reactive to light.  ?Cardiovascular:  ?   Rate and Rhythm: Normal rate and regular rhythm.  ?   Heart sounds: Normal heart sounds. No murmur heard. ?Pulmonary:  ?   Effort: Pulmonary effort is normal.  ?   Breath sounds: Normal breath sounds. No wheezing, rhonchi or rales.  ?Musculoskeletal:  ?   Cervical back: Normal range of motion and neck supple. No tenderness.  ?   Comments: Left lower leg: Erythematous, indurated with moderate soft tissue swelling noted-please see accompanying photo below  ?Lymphadenopathy:  ?   Cervical: No cervical adenopathy.  ?Skin: ?   General: Skin is  warm and dry.  ?Neurological:  ?   General: No focal deficit present.  ?   Mental Status: She is alert and oriented to person, place, and time. Mental status is at baseline.  ? ? ? ? ?UC Treatments / Results  ?Labs ?(all labs ordered are listed, but only abnormal results are displayed) ?Labs Reviewed - No data to display ? ?  EKG ? ? ?Radiology ?No results found. ? ?Procedures ?Procedures (including critical care time) ? ?Medications Ordered in UC ?Medications - No data to display ? ?Initial Impression / Assessment and Plan / UC Course  ?I have reviewed the triage vital signs and the nursing notes. ? ?Pertinent labs & imaging results that were available during my care of the patient were reviewed by me and considered in my medical decision making (see chart for details). ? ?  ? ?MDM: 1.  Cellulitis of left lower leg-patient instructed to go to [facility] for immediate evaluation of left lower leg cellulitis and to rule out left lower leg deep vein thrombosis.  Advised patient we will call ED now to make them aware of your impending arrival.  Patient agreed and verbalized understanding of these instructions and this plan of care.  Patient discharged hemodynamically stable. ?Final Clinical Impressions(s) / UC Diagnoses  ? ?Final diagnoses:  ?Cellulitis of left lower leg  ?Localized swelling of left lower leg  ? ? ? ?Discharge Instructions   ? ?  ?Instructed patient to go to [facility] for immediate evaluation of left lower leg cellulitis and to rule out left lower leg deep vein thrombosis.  Advised patient we will call ED now to make them aware of your impending arrival. ? ? ? ?ED Prescriptions   ?None ?  ? ?PDMP not reviewed this encounter. ?  ?Eliezer Lofts, [place] ?01/03/22 1611 ? ?

## 2022-01-03 NOTE — ED Notes (Signed)
Patient is being discharged from the Urgent Care and sent to the Emergency Department via POV . Per Kathi Ludwig NP, patient is in need of higher level of care due to Cellulitis. Patient is aware and verbalizes understanding of plan of care.  ?Vitals:  ? 01/03/22 1600  ?BP: 116/71  ?Pulse: 80  ?Resp: 16  ?Temp: 98.7 ?F (37.1 ?C)  ?  ?

## 2022-01-03 NOTE — ED Triage Notes (Signed)
Pt presents with left lower leg swelling and redness "for a few weeks" ?

## 2022-01-03 NOTE — Discharge Instructions (Addendum)
Instructed patient to go to [facility] for immediate evaluation of left lower leg cellulitis and to rule out left lower leg deep vein thrombosis.  Advised patient we will call ED now to make them aware of your impending arrival. ?

## 2022-01-04 ENCOUNTER — Telehealth: Payer: Self-pay | Admitting: General Practice

## 2022-01-04 NOTE — Telephone Encounter (Signed)
Transition Care Management Unsuccessful Follow-up Telephone Call ? ?Date of discharge and from where:  01/03/22 from [facility] ? ?Attempts:  1st Attempt ? ?Reason for unsuccessful TCM follow-up call:  Left voice message ? ?  ?

## 2022-01-09 NOTE — Telephone Encounter (Signed)
Transition Care Management Unsuccessful Follow-up Telephone Call  Date of discharge and from where:  01/03/22 from [facility]  Attempts:  2nd Attempt  Reason for unsuccessful TCM follow-up call:  Left voice message

## 2022-01-10 NOTE — Telephone Encounter (Signed)
Transition Care Management Unsuccessful Follow-up Telephone Call  Date of discharge and from where:  01/03/22 from [facility]  Attempts:  3rd Attempt  Reason for unsuccessful TCM follow-up call:  Left voice message

## 2022-03-29 NOTE — Progress Notes (Unsigned)
   Established Patient Office Visit  Subjective   Patient ID: Kristy Olson, female   DOB: 1932-04-20 Age: 88 y.o. MRN: [id number]   No chief complaint on file.   HPI Pleasant 88 year old female presenting today with reports of bilateral lower extremity edema and pain in her legs.  ROS    Objective:    There were no vitals filed for this visit.  Physical Exam   No results found for this or any previous visit (from the past 24 hour(s)).   {Labs (Optional):23779}  The ASCVD Risk score (Arnett DK, et al., 2019) failed to calculate for the following reasons:   The 2019 ASCVD risk score is only valid for ages 72 to 27   Assessment & Plan:   No problem-specific Assessment & Plan notes found for this encounter.  No follow-ups on file.  ___________________________________________ Clearnce Sorrel, DNP, APRN, FNP-BC Primary Care and [place]

## 2022-03-30 ENCOUNTER — Encounter: Payer: Self-pay | Admitting: Medical-Surgical

## 2022-03-30 ENCOUNTER — Ambulatory Visit (INDEPENDENT_AMBULATORY_CARE_PROVIDER_SITE_OTHER): Payer: Medicare Other | Admitting: Medical-Surgical

## 2022-03-30 VITALS — BP 127/60 | HR 67 | Resp 20 | Ht 67.0 in | Wt 132.1 lb

## 2022-03-30 DIAGNOSIS — I872 Venous insufficiency (chronic) (peripheral): Secondary | ICD-10-CM

## 2022-03-30 DIAGNOSIS — R600 Localized edema: Secondary | ICD-10-CM

## 2022-03-30 MED ORDER — CEPHALEXIN 500 MG PO CAPS: 500.0000 mg | ORAL_CAPSULE | Freq: Four times a day (QID) | ORAL | 0 refills | Status: AC

## 2022-03-31 LAB — BASIC METABOLIC PANEL WITH GFR
BUN/Creatinine Ratio: 20 (calc) (ref 6–22)
BUN: 22 mg/dL (ref 7–25)
CO2: 26 mmol/L (ref 20–32)
Calcium: 8.6 mg/dL (ref 8.6–10.4)
Chloride: 101 mmol/L (ref 98–110)
Creat: 1.09 mg/dL — ABNORMAL HIGH (ref 0.60–0.95)
Glucose, Bld: 89 mg/dL (ref 65–99)
Potassium: 4.1 mmol/L (ref 3.5–5.3)
Sodium: 138 mmol/L (ref 135–146)
eGFR: 49 mL/min/{1.73_m2} — ABNORMAL LOW (ref >=60)

## 2022-03-31 LAB — TSH: TSH: 4.71 mIU/L — ABNORMAL HIGH (ref 0.40–4.50)

## 2022-04-05 NOTE — Progress Notes (Unsigned)
   Established Patient Office Visit  Subjective   Patient ID: Kristy Olson, female   DOB: 1932-01-08 Age: 88 y.o. MRN: [id number]   No chief complaint on file.   HPI Pleasant 88 year old female presenting today for follow-up on lower extremity edema.  She was seen last week with significant bilateral lower extremity edema that was painful and had gotten erythematous.  We treated for cellulitis with Keflex and she was instructed on elevation, compression, sodium restriction, and keeping her skin moisturized.   Objective:    There were no vitals filed for this visit.  Physical Exam   No results found for this or any previous visit (from the past 24 hour(s)).   {Labs (Optional):23779}  The ASCVD Risk score (Arnett DK, et al., 2019) failed to calculate for the following reasons:   The 2019 ASCVD risk score is only valid for ages 60 to 35   Assessment & Plan:   No problem-specific Assessment & Plan notes found for this encounter.   No follow-ups on file.  ___________________________________________ Clearnce Sorrel, DNP, APRN, FNP-BC Primary Care and [place]

## 2022-04-06 ENCOUNTER — Ambulatory Visit: Payer: Medicare Other | Admitting: Medical-Surgical

## 2022-04-06 ENCOUNTER — Encounter: Payer: Self-pay | Admitting: Medical-Surgical

## 2022-04-06 VITALS — BP 118/55 | HR 72 | Resp 20 | Ht 67.0 in | Wt 129.1 lb

## 2022-04-06 DIAGNOSIS — R600 Localized edema: Secondary | ICD-10-CM | POA: Diagnosis not present

## 2022-04-06 MED ORDER — FUROSEMIDE 20 MG PO TABS: 20.0000 mg | ORAL_TABLET | Freq: Every day | ORAL | 0 refills | Status: DC

## 2022-04-18 ENCOUNTER — Telehealth: Payer: Self-pay | Admitting: Medical-Surgical

## 2022-04-18 NOTE — Telephone Encounter (Signed)
Pt called in this afternoon. She stated that she took Lasix for swelling in her legs  for 3 days. Her legs are still swollen. She is asking what should she do next?

## 2022-04-26 NOTE — Telephone Encounter (Signed)
Attempted to contact patient, no answer. Left a detailed reason of the call. Patient informed to return a call back to the clinic with an update on bilateral swelling. Direct call info provided.

## 2022-05-01 ENCOUNTER — Telehealth: Payer: Self-pay | Admitting: General Practice

## 2022-05-01 NOTE — Telephone Encounter (Signed)
Transition Care Management Follow-up Telephone Call Date of discharge and from where: 04/30/22 from [facility] How have you been since you were released from the hospital? Patient is doing a little better. She is planning to get her medications today from the pharmacy.  Any questions or concerns? No  Items Reviewed: Did the pt receive and understand the discharge instructions provided? Yes  Medications obtained and verified? No  Other? No  Any new allergies since your discharge? No  Dietary orders reviewed? Yes Do you have support at home? Yes   Home Care and Equipment/Supplies: Were home health services ordered? no  Functional Questionnaire: (I = Independent and D = Dependent) ADLs: I  Bathing/Dressing- I  Meal Prep- I  Eating- I  Maintaining continence- I  Transferring/Ambulation- I  Managing Meds- I  Follow up appointments reviewed:  PCP Hospital f/u appt confirmed? Yes  Scheduled to see Samuel Bouche, NP on 05/05/22 @ 1440. [facility] f/u appt confirmed? No   Are transportation arrangements needed? No  If their condition worsens, is the pt aware to call PCP or go to the Emergency Dept.? Yes Was the patient provided with contact information for the PCP's office or ED? Yes Was to pt encouraged to call back with questions or concerns? Yes

## 2022-05-05 ENCOUNTER — Encounter: Payer: Self-pay | Admitting: Medical-Surgical

## 2022-05-05 ENCOUNTER — Ambulatory Visit (INDEPENDENT_AMBULATORY_CARE_PROVIDER_SITE_OTHER): Payer: Medicare Other | Admitting: Medical-Surgical

## 2022-05-05 VITALS — BP 121/55 | HR 73 | Resp 20 | Ht 67.0 in | Wt 133.1 lb

## 2022-05-05 DIAGNOSIS — Z09 Encounter for follow-up examination after completed treatment for conditions other than malignant neoplasm: Secondary | ICD-10-CM | POA: Diagnosis not present

## 2022-05-05 DIAGNOSIS — I872 Venous insufficiency (chronic) (peripheral): Secondary | ICD-10-CM | POA: Diagnosis not present

## 2022-05-05 DIAGNOSIS — M1611 Unilateral primary osteoarthritis, right hip: Secondary | ICD-10-CM | POA: Diagnosis not present

## 2022-05-05 DIAGNOSIS — R600 Localized edema: Secondary | ICD-10-CM | POA: Diagnosis not present

## 2022-05-05 MED ORDER — MELOXICAM 7.5 MG PO TABS: 7.5000 mg | ORAL_TABLET | Freq: Every day | ORAL | 2 refills | Status: DC

## 2022-05-05 NOTE — Progress Notes (Signed)
Established Patient Office Visit  Subjective   Patient ID: Kristy Olson, female   DOB: [birth year] Age: 88 y.o. MRN: [id number]   Chief Complaint  Patient presents with   Hospitalization Follow-up    HPI Pleasant 88 year old female presenting today for hospital follow-up after being evaluated for severe right hip pain.  Notes that the pain came on suddenly and was worse when lying in bed.  Pain resolves with sitting and standing.  She was found to have severe osteoarthritis affecting her right hip and was prescribed 2 medications which she has been taking once daily with lunch.  She has been taking this for a few days and notes that it has helped tremendously with her pain.  She would like to have a refill to continue this medication as long as it is safe.  Of note, she has been able to establish with a podiatrist that comes to [facility] to see her in her home.  They have done some further work-up and discovered osteoarthritis of the bilateral ankles.  She also has developed a another wound on the left foot and they are treating this with an Haematologist.  She has had 3 weeks of treatment with the Unna boot and notes that she has an appointment on Monday where she was told to bring her compression sock and her shoe.  She is considerably worried about using compression socks since she has difficulty bending her left leg far enough to get them on and she also has difficulty with her hands and the strength required to put the socks on.  She has several pair at home and is not interested in buying new ones that may have zippers on the side.  She feels that she is not going to be able to use compression socks every day because it is just too much for her to handle but she is very worried about the swelling in her legs and wants it gone.  She notes that the short trial of low-dose Lasix was not beneficial and she is still having significant swelling of the legs.  Has difficulty getting her pants on and  reports this has gotten very exhaustive as she has frequent incontinent episodes requiring her to change her clothes several times daily.    Objective:    Vitals:   05/05/22 1441  BP: (!) 121/55  Pulse: 73  Resp: 20  Height: '5\' 7"'$  (1.702 m)  Weight: 133 lb 1.3 oz (60.4 kg)  SpO2: 95%  BMI (Calculated): 20.84    Physical Exam Vitals and nursing note reviewed.  Constitutional:      General: She is not in acute distress.    Appearance: Normal appearance. She is normal weight. She is not ill-appearing.  HENT:     Head: Normocephalic and atraumatic.  Cardiovascular:     Rate and Rhythm: Normal rate and regular rhythm.     Pulses: Normal pulses.     Heart sounds: Normal heart sounds.  Pulmonary:     Effort: Pulmonary effort is normal. No respiratory distress.     Breath sounds: Normal breath sounds. No wheezing, rhonchi or rales.  Musculoskeletal:     Right lower leg: Edema present.     Left lower leg: Edema (Unna boot present) present.  Skin:    General: Skin is warm and dry.  Neurological:     Mental Status: She is alert and oriented to person, place, and time.  Psychiatric:        Mood  and Affect: Mood normal.        Behavior: Behavior normal.        Thought Content: Thought content normal.        Judgment: Judgment normal.   No results found for this or any previous visit (from the past 24 hour(s)).     The ASCVD Risk score (Arnett DK, et al., 2019) failed to calculate for the following reasons:   The 2019 ASCVD risk score is only valid for ages 68 to 51   Assessment & Plan:   1. Bilateral lower extremity edema 2. Chronic venous insufficiency Recommend following up with the podiatrist as scheduled.  Discussed the importance of conservative measures when dealing with chronic venous insufficiency and bilateral lower extremity edema.  Unfortunately, I think it is at a point where she will have to decide whether she wants to deal with the compression socks or the  swelling.  If she does not wear the compression socks, she will definitely have the swelling.  She is going to have to decide which is more difficult to deal with.  Discussed possible fitting for compression socks that have a zipper however she is very hesitant to do this since she already has several pairs of compression socks at home.  Also discussed looking into options for devices that may help with getting compression socks on and off.  Advised her to ask the podiatrist if they know of any thing that may help the situation.  3. Hospital discharge follow-up 4. Primary osteoarthritis of right hip Reviewed hospital records.  She is currently doing well and having no significant pain.  Unfortunately, she has extensive arthritis affecting multiple joints.  This is not a surprising finding given her age and overall health status.  They did start her on meloxicam 7.5 mg daily at discharge which I will be glad to continue.  They also gave her a prescription for tramadol 1/2 tablet every 6 hours as needed for pain management.  I would like for her to see how she does with the meloxicam alone before continuing tramadol prescription chronically.  Return in about 3 months (around 08/04/2022) for chronic disease follow up.  ___________________________________________ Clearnce Sorrel, DNP, APRN, FNP-BC Primary Care and [place]

## 2022-05-05 NOTE — Telephone Encounter (Signed)
Pt seen in office 05/05/22.  Charyl Bigger, CMA

## 2022-06-01 NOTE — Telephone Encounter (Signed)
MyChart activation project/no answer

## 2022-06-04 ENCOUNTER — Other Ambulatory Visit: Payer: Self-pay | Admitting: Medical-Surgical

## 2022-06-04 DIAGNOSIS — I2510 Atherosclerotic heart disease of native coronary artery without angina pectoris: Secondary | ICD-10-CM

## 2022-06-16 ENCOUNTER — Ambulatory Visit: Payer: Medicare Other | Admitting: Medical-Surgical

## 2022-08-15 ENCOUNTER — Ambulatory Visit (INDEPENDENT_AMBULATORY_CARE_PROVIDER_SITE_OTHER): Payer: Medicare Other | Admitting: Medical-Surgical

## 2022-08-15 ENCOUNTER — Encounter: Payer: Self-pay | Admitting: Medical-Surgical

## 2022-08-15 VITALS — BP 140/52 | HR 66 | Resp 20 | Ht 67.0 in | Wt 136.6 lb

## 2022-08-15 DIAGNOSIS — R600 Localized edema: Secondary | ICD-10-CM | POA: Diagnosis not present

## 2022-08-15 MED ORDER — AMBULATORY NON FORMULARY MEDICATION: 0 refills | Status: DC

## 2022-08-15 NOTE — Progress Notes (Signed)
Established Patient Office Visit  Subjective   Patient ID: Kristy Olson, female   DOB: 07/15/1932 Age: 88 y.o. MRN: [id number]   Chief Complaint  Patient presents with   Leg Swelling   Follow-up   HPI Pleasant 88 year old female presenting today with continued reports of lower extremity edema. She has been advised in the past that her only option for management is compression socks/stockings and lower extremity elevation. Unfortunately, her flexibility limitations prevents her from being able to get the compression socks on. She lives in assisted living but was told it would cost her over $400 to have the nurse there assist her. This is not financially feasible. The wound care center gave her a compression sock to try which she has on today. The sock seems to pinch her toes so she cut the toe of the sock off and has been wearing it like that. Her toes are now significantly swollen. She has tried what sounds to be a lymphedema pump before but after one try, refused to use it again. Sleeping in a chair due to difficulty with getting out of bed. Tries to elevate her legs on another chair but when she falls asleep, her legs fall down. Previously tried low dose Lasix but is not willing to continue this due to already severe issues with urinary incontinence. Does not add salt to foods. Previously seen by [facility] to help manage chronic venous insufficiency and reports a prior surgical procedure to help address this.    Objective:    Vitals:   08/15/22 1415  BP: (!) 140/52  Pulse: 66  Resp: 20  Height: '5\' 7"'$  (1.702 m)  Weight: 136 lb 9.6 oz (62 kg)  SpO2: 95%  BMI (Calculated): 21.39    Physical Exam Vitals and nursing note reviewed.  Constitutional:      General: She is not in acute distress.    Appearance: Normal appearance. She is normal weight. She is not ill-appearing.  HENT:     Head: Normocephalic and atraumatic.  Cardiovascular:     Rate and Rhythm: Normal rate and  regular rhythm.     Pulses: Normal pulses.     Heart sounds: Normal heart sounds.  Pulmonary:     Effort: Pulmonary effort is normal. No respiratory distress.     Breath sounds: Normal breath sounds. No wheezing, rhonchi or rales.  Musculoskeletal:     Right lower leg: Edema present.     Left lower leg: Edema present.  Skin:    General: Skin is warm and dry.  Neurological:     Mental Status: She is alert and oriented to person, place, and time.  Psychiatric:        Mood and Affect: Mood normal.        Behavior: Behavior normal.        Thought Content: Thought content normal.        Judgment: Judgment normal.   No results found for this or any previous visit (from the past 24 hour(s)).     The ASCVD Risk score (Arnett DK, et al., 2019) failed to calculate for the following reasons:   The 2019 ASCVD risk score is only valid for ages 25 to 48   Assessment & Plan:   1. Bilateral lower extremity edema Multiple interventions have been tried over the past year to manage this issue: - Diuretics- tried temporarily, decline for long term use - Compression- unable to get them on independently, intolerant of pressure on toes -  Low sodium diet- complaint - Elevation- advised that elevation should be higher than the level of the heart in order to be effective. Patient reports this is not doable. Advised that she should try to sleep in her bed with her legs elevated on a pillow but she reports that she is worried that she will not be able to get out of bed or will have seeping from her legs cause stains on her sheets. Recommended using a towel or other sheet over the pillow but she was not highly interested in this option.  - Lymphedema pump- has this but declines to use it  Ultimately, I am at a loss for any further options to limit the edema. Since she is having trouble with the regular compression socks, we will send a prescription for the zippered option in hopes that insurance will cover  them. Although I don't think there are many alternatives given her age, referring back to Vascular for any additional recommendations they may have.  - Ambulatory referral to Vascular Surgery   Return if symptoms worsen or fail to improve.  ___________________________________________ Clearnce Sorrel, DNP, APRN, FNP-BC Primary Care and [place]

## 2022-10-06 ENCOUNTER — Other Ambulatory Visit: Payer: Self-pay | Admitting: *Deleted

## 2022-10-06 DIAGNOSIS — I872 Venous insufficiency (chronic) (peripheral): Secondary | ICD-10-CM

## 2022-10-06 DIAGNOSIS — R609 Edema, unspecified: Secondary | ICD-10-CM

## 2022-10-24 ENCOUNTER — Ambulatory Visit: Payer: Medicare Other | Admitting: Vascular Surgery

## 2022-10-24 ENCOUNTER — Encounter: Payer: Self-pay | Admitting: Vascular Surgery

## 2022-10-24 ENCOUNTER — Ambulatory Visit (HOSPITAL_COMMUNITY)
Admission: RE | Admit: 2022-10-24 | Discharge: 2022-10-24 | Disposition: A | Discharge status: Home / Self Care | Payer: Medicare Other | Source: Ambulatory Visit | Attending: Vascular Surgery | Admitting: Vascular Surgery

## 2022-10-24 VITALS — BP 141/53 | HR 56 | Temp 97.9°F | Resp 14 | Ht 67.0 in | Wt 136.0 lb

## 2022-10-24 DIAGNOSIS — I872 Venous insufficiency (chronic) (peripheral): Secondary | ICD-10-CM

## 2022-10-24 DIAGNOSIS — R609 Edema, unspecified: Secondary | ICD-10-CM | POA: Diagnosis present

## 2022-10-24 NOTE — Progress Notes (Signed)
Patient name: Kristy Olson MRN: [id number] DOB: 06-Oct-1931 Sex: female  REASON FOR CONSULT: Bilateral lower extremity edema  HPI: Kristy Olson is a 89 y.o. female, with hx CAD, chronic venous insufficiency, HLD that presents for evaluation of bilateral lower extremity edema.  She states she has had bilateral lower extremity swelling for years.  She states she has had her small and big vein ablated in the left leg years ago at another institution.  She cannot remember exactly where or when.  She cannot wear compression stockings because she has a hard time bending her leg and getting them on.  She lives in an independent living facility.  She previously had a venous ulcer that has now healed on the left leg and was going to a wound clinic.  Her swelling in her legs is quite profound and bothersome.  Past Medical History:  Diagnosis Date   Abnormal finding on MRI of brain    Breast cancer of upper-inner quadrant of left female breast ([place]) 10/12/2015   CAD (coronary artery disease)    Chronic venous insufficiency    Compression stockings, Dr. Carmine Savoy to evaluate late May 2015    Degeneration of lumbar or lumbosacral intervertebral disc    History of thyroid cancer    With reported brain mets.    Hyperlipidemia    Hypothyroid    Outside records report synthroid 110mg despite her report of 1049m. 02/2014 awaiting clarification    Idiopathic scoliosis    Osteoporosis    July 2014 DEXA    PNEUMONIA, COMMUNITY ACQUIRED, PNEUMOCOCCAL    [place]    Past Surgical History:  Procedure Laterality Date   BREAST LUMPECTOMY WITH RADIOACTIVE SEED LOCALIZATION Left 03/29/2016   Procedure: LEFT BREAST LUMPECTOMY WITH RADIOACTIVE SEED LOCALIZATION;  Surgeon: PaAutumn MessingII, MD;  Location: [facility] Service: General;  Laterality: Left;  LEFT BREAST LUMPECTOMY WITH RADIOACTIVE SEED LOCALIZATION   cranotomy     THYROIDECTOMY      Family History  Problem  Relation Age of Onset   CAD Sister    Cancer Sister    Heart disease Sister    Diabetes Father    COPD Father     SOCIAL HISTORY: Social History   Socioeconomic History   Marital status: Widowed    Spouse name: Not on file   Number of children: 2   Years of education: Not on file   Highest education level: Not on file  Occupational History    Employer: RETIRED  Tobacco Use   Smoking status: Never   Smokeless tobacco: Never  Vaping Use   Vaping Use: Never used  Substance and Sexual Activity   Alcohol use: Yes    Alcohol/week: 0.0 standard drinks of alcohol    Comment: Occasional   Drug use: No   Sexual activity: Not on file  Other Topics Concern   Not on file  Social History Narrative   Not on file   Social Determinants of Health   Financial Resource Strain: Not on file  Food Insecurity: Not on file  Transportation Needs: Not on file  Physical Activity: Not on file  Stress: Not on file  Social Connections: Not on file  Intimate Partner Violence: Not on file    Allergies  Allergen Reactions   Neomy-Bacit-Polymyx-Pramoxine Itching   Amoxicillin Hives and Other (See Comments)   Doxycycline Hives and Other (See Comments)   Levofloxacin Other (See Comments)    03/25/2020 Patient states  she does not remember reaction    Statins Hives and Other (See Comments)    Hives Other reaction(s): unknown   Colesevelam Hives   Neomycin Hives    Allergic reaction only topically. Only topically   Red Dye Hives and Itching    Red frosting caused itching and facial swelling  Patient is uncertain this reaction occurred (02/21/18) currently eats foods with red dye   Tape Hives and Itching    Current Outpatient Medications  Medication Sig Dispense Refill   aspirin 81 MG tablet Take 81 mg by mouth daily.     metoprolol succinate (TOPROL-XL) 25 MG 24 hr tablet TAKE ONE HALF TABLET (12.'5MG'$ ) BY MOUTH EVERY OTHER DAY 45 tablet 1   SYNTHROID 150 MCG tablet TAKE 1 TABLET(150 MCG)  BY MOUTH DAILY 90 tablet 1   Vibegron (GEMTESA) 75 MG TABS Take 1 tablet by mouth daily.     AMBULATORY NON FORMULARY MEDICATION Knee-high, medium compression, graduated compression stockings. Apply to lower extremities. [email or web address], Zippered Compression Stockings, medium circ, long length 1 each 0   furosemide (LASIX) 20 MG tablet Take 1 tablet (20 mg total) by mouth daily. (Patient not taking: Reported on 10/24/2022) 3 tablet 0   meloxicam (MOBIC) 7.5 MG tablet Take 1 tablet (7.5 mg total) by mouth daily. 30 tablet 2   methocarbamol (ROBAXIN) 500 MG tablet Take 500 mg by mouth 2 (two) times daily.     No current facility-administered medications for this visit.    REVIEW OF SYSTEMS:  '[X]'$  denotes positive finding, '[ ]'$  denotes negative finding Cardiac  Comments:  Chest pain or chest pressure:    Shortness of breath upon exertion:    Short of breath when lying flat:    Irregular heart rhythm:        Vascular    Pain in calf, thigh, or hip brought on by ambulation:    Pain in feet at night that wakes you up from your sleep:     Blood clot in your veins:    Leg swelling:  x       Pulmonary    Oxygen at home:    Productive cough:     Wheezing:         Neurologic    Sudden weakness in arms or legs:     Sudden numbness in arms or legs:     Sudden onset of difficulty speaking or slurred speech:    Temporary loss of vision in one eye:     Problems with dizziness:         Gastrointestinal    Blood in stool:     Vomited blood:         Genitourinary    Burning when urinating:     Blood in urine:        Psychiatric    Major depression:         Hematologic    Bleeding problems:    Problems with blood clotting too easily:        Skin    Rashes or ulcers:        Constitutional    Fever or chills:      PHYSICAL EXAM: Vitals:   10/24/22 1535  BP: (!) 141/53  Pulse: (!) 56  Resp: 14  Temp: 97.9 F (36.6 C)  TempSrc: Temporal  SpO2: 94%  Weight: 136 lb (61.7  kg)  Height: '5\' 7"'$  (1.702 m)    GENERAL: The patient is a well-nourished female, in no  acute distress. The vital signs are documented above. CARDIAC: There is a regular rate and rhythm.  VASCULAR:  Bilateral femoral pulses palpable Right PT brisk by Doppler Left DP brisk by Doppler Positive Stemmer sign with woody edema in both lower extremities and hyperkeratosis PULMONARY: No respiratory distress. ABDOMEN: Soft and non-tender.  MUSCULOSKELETAL: There are no major deformities or cyanosis. NEUROLOGIC: No focal weakness or paresthesias are detected. SKIN: There are no ulcers or rashes noted. PSYCHIATRIC: The patient has a normal affect.  DATA:   Lower Venous Reflux Study   Patient Name:  HUMA IMPELLIZZERI  Date of Exam:   10/24/2022  Medical Rec #: [id number]        Accession #:    [id number]  Date of Birth: 01-05-1932        Patient Gender: F  Patient Age:   57 years  Exam Location:  [facility]  Procedure:      VAS [place] LOWER EXTREMITY VENOUS REFLUX  Referring Phys: Monica Martinez    ---------------------------------------------------------------------------  -----    Indications: Edema.    Comparison Study: No prior study   Performing Technologist: Maudry Mayhew MHA, RDMS, RVT, RDCS     Examination Guidelines: A complete evaluation includes B-mode imaging,  spectral  Doppler, color Doppler, and power Doppler as needed of all accessible  portions  of each vessel. Bilateral testing is considered an integral part of a  complete  examination. Limited examinations for reoccurring indications may be  performed  as noted. The reflux portion of the exam is performed with the patient in  reverse Trendelenburg.  Significant venous reflux is defined as >500 ms in the superficial venous  system, and >1 second in the deep venous system.     +--------------+---------+------+-----------+------------+-------------+  LEFT         Reflux NoRefluxReflux  TimeDiameter cmsComments                               Yes                                        +--------------+---------+------+-----------+------------+-------------+  CFV          no                                                   +--------------+---------+------+-----------+------------+-------------+  FV mid        no                                                   +--------------+---------+------+-----------+------------+-------------+  Popliteal              yes   >1 second                            +--------------+---------+------+-----------+------------+-------------+  GSV at SFJ              yes    >500 ms      0.74                   +--------------+---------+------+-----------+------------+-------------+  GSV prox thigh          yes    >500 ms      0.36                   +--------------+---------+------+-----------+------------+-------------+  GSV mid thigh no                            0.25                   +--------------+---------+------+-----------+------------+-------------+  GSV dist thigh                                      out of fascia  +--------------+---------+------+-----------+------------+-------------+  SSV Pop Fossa           yes    >500 ms      0.32                   +--------------+---------+------+-----------+------------+-------------+  SSV prox calf no                            0.15                   +--------------+---------+------+-----------+------------+-------------+  SSV mid calf  no                                                   +--------------+---------+------+-----------+------------+-------------+      Summary:  Left:  - No evidence of deep vein thrombosis seen in the left lower extremity,  from the common femoral through the popliteal veins.  - No evidence of superficial venous thrombosis in the left lower  extremity.  - The deep venous system is  incompetent at the popliteal vein.  - The great saphenous vein is incompetent.  - The small saphenous vein is incompetent at the popliteal fossa.    *See table(s) above for measurements and observations.   Assessment/Plan:  89 year old female with evidence of chronic venous insufficiency consistent with CEAP classification C5 given healed venous ulcer.  In addition I think she has a component of lymphedema with a positive Stemmer sign along with hyperkeratosis.  I discussed that her venous reflux study today shows some reflux in both the deep and superficial venous system.  She states she has already had multiple veins in her left leg ablated years ago.  I really do not see a long segment superficial vein amendable to ablation.  I have again recommended elevation, compression, exercise etc.  We are going to get her sized for some knee-high compression stockings today that are not as tight to see if this would be easier to wear.  I will have her come back in 4 weeks to see one of the PAs.  She really has marked edema on exam and I think she would likely benefit from lymphedema pumps if she fails compression stockings.   Marty Heck, MD [facility] Office: [phone]

## 2022-11-23 ENCOUNTER — Ambulatory Visit: Payer: Medicare Other | Admitting: Medical-Surgical

## 2022-11-24 ENCOUNTER — Ambulatory Visit: Payer: Medicare Other | Admitting: Family Medicine

## 2022-11-27 ENCOUNTER — Ambulatory Visit: Payer: Medicare Other | Admitting: Medical-Surgical

## 2022-11-27 ENCOUNTER — Encounter: Payer: Self-pay | Admitting: Medical-Surgical

## 2022-11-27 VITALS — BP 120/58 | HR 69 | Resp 20 | Ht 67.0 in | Wt 134.1 lb

## 2022-11-27 DIAGNOSIS — N3945 Continuous leakage: Secondary | ICD-10-CM | POA: Diagnosis not present

## 2022-11-27 LAB — POCT URINALYSIS DIP (CLINITEK)
Bilirubin, UA: NEGATIVE
Glucose, UA: NEGATIVE mg/dL
Ketones, POC UA: NEGATIVE mg/dL
Nitrite, UA: NEGATIVE
POC PROTEIN,UA: 100 — AB
Spec Grav, UA: 1.02 (ref 1.010–1.025)
Urobilinogen, UA: 1 E.U./dL
pH, UA: 5.5 (ref 5.0–8.0)

## 2022-11-27 NOTE — Patient Instructions (Addendum)
Schedule a follow up with Urology to discuss urinary concerns and medications.  Make sure to wear compression socks/stockings once you get them.  Please wear the pads AND briefs to help manage incontinence and reduce the workload on laundry.   Make sure to go to the bathroom to urinate 30 minutes after a meal and again an hour later to help avoid accidents.

## 2022-11-27 NOTE — Progress Notes (Signed)
        Established patient visit  History, exam, impression, and plan:  1. Continuous leakage of urine Kristy Olson 89 year old female presenting today with complaints of continued incontinence episodes.  She reports over the past few weeks, she has had frequency of urination along with urgency.  She has had a "weird feeling" in the vaginal area with urination.  Is having incontinence episodes up to 3-4 times daily.  Has been using pads in her underwear however has been unable to find the 1 pad brand that was working well for her.  She is having to use poise overnight at this time but it is not as beneficial as the one she had before.  With incontinence episodes, she often stools are close and therefore has to do increased amounts of laundry.  Notes her thirst has been a little bit elevated and she has had a little bit of lower abdominal pain although this was short-lived.  Noted a small amount of vaginal discharge on toilet paper however this was a single occurrence.  No fevers, chills, nausea, vomiting, altered mental status, urinary odor, or hematuria.  Has a urologist who has been prescribing Gemtesa.  Unsure when her last visit was with that provider but feels she may have 1 coming up.  Unfortunately, I think this is simply age-related incontinence.  POCT urinalysis completed today, positive for moderate leukocytes, small blood, protein.  Negative for nitrites.  Sending for culture today.  Strongly recommend reconnecting with urology to discuss further options.  Also reviewed recommendations for wearing adult briefs as well as a pad if necessary to help reduce her workload for sold laundry.  Procedures performed this visit: None.  Return if symptoms worsen or fail to improve.  __________________________________ Thayer Ohm, DNP, APRN, FNP-BC Primary Care and Sports Medicine [facility]

## 2022-11-28 ENCOUNTER — Ambulatory Visit: Payer: Medicare Other | Admitting: Physician Assistant

## 2022-11-28 VITALS — BP 139/59 | HR 58 | Temp 97.9°F | Resp 18 | Ht 67.0 in | Wt 134.5 lb

## 2022-11-28 DIAGNOSIS — I890 Lymphedema, not elsewhere classified: Secondary | ICD-10-CM

## 2022-11-28 DIAGNOSIS — I872 Venous insufficiency (chronic) (peripheral): Secondary | ICD-10-CM

## 2022-11-28 NOTE — Progress Notes (Signed)
Office Note  History of Present Illness   Kristy Olson is a 89 y.o. ([birth year]) female who presents for follow-up on chronic venous insufficiency.  She was first seen by Dr. Chestine Spore little over a month ago with reports of bilateral lower extremity swelling for several years.  She has a history of vein ablation of her left lower extremity years ago.  She also has a history of a venous ulcer on the left leg that is now healed.  She was instructed to try elevation, compression, and exercise to help with her leg swelling.  She returns today for repeat evaluation.  She has previously been unable to tolerate normal compression stockings because she has a hard time getting them on.  Over the past month, she tried to use compression stockings that had the "toes out".  These compression stockings did not help her leg swelling at all and also made her foot swelling worse.  Elevation and exercise have also not helped her leg swelling.  She is frustrated that she cannot get her leg swelling under control.  She states that she is waiting on a different pair of compression stockings that have the "foot out" to see if these will help with her leg swelling.  Past Medical History:  Diagnosis Date   Abnormal finding on MRI of brain    Breast cancer of upper-inner quadrant of left female breast 10/12/2015   CAD (coronary artery disease)    Chronic venous insufficiency    Compression stockings, Dr. Florentina Addison to evaluate late May 2015    Degeneration of lumbar or lumbosacral intervertebral disc    History of thyroid cancer    With reported brain mets.    Hyperlipidemia    Hypothyroid    Outside records report synthroid despite her report of . 02/2014 awaiting clarification    Idiopathic scoliosis    Osteoporosis    July 2014 DEXA    PNEUMONIA, COMMUNITY ACQUIRED, PNEUMOCOCCAL    Venous stasis ulcer     Past Surgical History:  Procedure Laterality Date   BREAST LUMPECTOMY WITH  RADIOACTIVE SEED LOCALIZATION Left 03/29/2016   Procedure: LEFT BREAST LUMPECTOMY WITH RADIOACTIVE SEED LOCALIZATION;  Surgeon: Chevis Pretty III, MD;  Location: [facility];  Service: General;  Laterality: Left;  LEFT BREAST LUMPECTOMY WITH RADIOACTIVE SEED LOCALIZATION   cranotomy     THYROIDECTOMY      Social History   Socioeconomic History   Marital status: Widowed    Spouse name: Not on file   Number of children: 2   Years of education: Not on file   Highest education level: Not on file  Occupational History    Employer: RETIRED  Tobacco Use   Smoking status: Never   Smokeless tobacco: Never  Vaping Use   Vaping Use: Never used  Substance and Sexual Activity   Alcohol use: Yes    Alcohol/week: 0.0 standard drinks of alcohol    Comment: Occasional   Drug use: No   Sexual activity: Not on file  Other Topics Concern   Not on file  Social History Narrative   Not on file   Social Determinants of Health   Financial Resource Strain: Not on file  Food Insecurity: Not on file  Transportation Needs: Not on file  Physical Activity: Not on file  Stress: Not on file  Social Connections: Not on file  Intimate Partner Violence: Not on file  Family History  Problem Relation Age of Onset   CAD Sister    Cancer Sister    Heart disease Sister    Diabetes Father    COPD Father     Current Outpatient Medications  Medication Sig Dispense Refill   AMBULATORY NON FORMULARY MEDICATION Knee-high, medium compression, graduated compression stockings. Apply to lower extremities. [email or web address], Zippered Compression Stockings, medium circ, long length 1 each 0   aspirin 81 MG tablet Take 81 mg by mouth daily.     metoprolol succinate (TOPROL-XL) 25 MG 24 hr tablet TAKE ONE HALF TABLET (12.5MG ) BY MOUTH EVERY OTHER DAY 45 tablet 1   SYNTHROID 150 MCG tablet TAKE 1 TABLET(150 MCG) BY MOUTH DAILY 90 tablet 1   Vibegron (GEMTESA) 75 MG TABS Take 1 tablet by mouth daily.      sulfamethoxazole-trimethoprim (BACTRIM DS) 800-160 MG tablet Take 1 tablet by mouth 2 (two) times daily. 6 tablet 0   No current facility-administered medications for this visit.    Allergies  Allergen Reactions   Neomy-Bacit-Polymyx-Pramoxine Itching   Amoxicillin Hives and Other (See Comments)   Doxycycline Hives and Other (See Comments)   Levofloxacin Other (See Comments)    03/25/2020 Patient states she does not remember reaction    Statins Hives and Other (See Comments)    Hives Other reaction(s): unknown   Colesevelam Hives   Neomycin Hives    Allergic reaction only topically. Only topically   Red Dye Hives and Itching    Red frosting caused itching and facial swelling  Patient is uncertain this reaction occurred (02/21/18) currently eats foods with red dye   Tape Hives and Itching    REVIEW OF SYSTEMS (negative unless checked):   Cardiac:  []  Chest pain or chest pressure? []  Shortness of breath upon activity? []  Shortness of breath when lying flat? []  Irregular heart rhythm?  Vascular:  []  Pain in calf, thigh, or hip brought on by walking? []  Pain in feet at night that wakes you up from your sleep? []  Blood clot in your veins? [x]  Leg swelling?  Pulmonary:  []  Oxygen at home? []  Productive cough? []  Wheezing?  Neurologic:  []  Sudden weakness in arms or legs? []  Sudden numbness in arms or legs? []  Sudden onset of difficult speaking or slurred speech? []  Temporary loss of vision in one eye? []  Problems with dizziness?  Gastrointestinal:  []  Blood in stool? []  Vomited blood?  Genitourinary:  []  Burning when urinating? []  Blood in urine?  Psychiatric:  []  Major depression  Hematologic:  []  Bleeding problems? []  Problems with blood clotting?  Dermatologic:  []  Rashes or ulcers?  Constitutional:  []  Fever or chills?  Ear/Nose/Throat:  []  Change in hearing? []  Nose bleeds? []  Sore throat?  Musculoskeletal:  []  Back pain? []  Joint  pain? []  Muscle pain?   Physical Examination     Vitals:   11/28/22 1519  BP: (!) 139/59  Pulse: (!) 58  Resp: 18  Temp: 97.9 F (36.6 C)  TempSrc: Temporal  SpO2: 97%  Weight: 134 lb 8 oz (61 kg)  Height: 5\' 7"  (1.702 m)   Body mass index is 21.07 kg/m.  General:  WDWN in NAD; vital signs documented above Gait: Not observed HENT: WNL, normocephalic Pulmonary: normal non-labored breathing  Cardiac: regular Abdomen: soft, NT, no masses Skin: without rashes Vascular Exam/Pulses: Brisk right PT and left DP Doppler signals Extremities: Bilateral lower extremities edematous with hyperkeratosis and hyperpigmentation Musculoskeletal: no muscle wasting or atrophy  Neurologic:  A&O X 3;  No focal weakness or paresthesias are detected Psychiatric:  The pt has Normal affect.  Medical Decision Making   Kristy Olson is a 89 y.o. female who presents for follow-up on chronic venous insufficiency  The patient was first evaluated by Dr. Chestine Spore a little over a month ago.  She was having continued bilateral lower extremity swelling consistent with a mixture of venous insufficiency and lymphedema.  Her venous reflux study demonstrated reflux in her left lower extremity deep and superficial venous system.  She has a history of venous ablation with no good options for repeat ablation.  She was recommended to try elevation, compression, and exercise daily for a month and then follow-up. The patient returns today with continued, unchanged bilateral lower extremity swelling.  She has tried compression, elevation, and exercise.  She states that her previous compression stockings with the "toes out" did not help with her leg swelling and specifically made her toes more swollen.  On exam she still has significant lower extremity swelling bilaterally.  This swelling has been present for several years with previous history of left leg ulceration.  She does have skin changes including hyperpigmentation  and hyperkeratosis, with a positive Stemmer sign all suggestive of severe lymphedema.  I have recommended the use of a lymphedema pump to help with leg swelling and skin changes, since she has seen no improvement with elevation or compression.  At this time she intends to try another pair of compression stockings and would like to defer on lymphedema pumps. The patient will try a new pair of compression stockings for the time being.  I have obtained her leg measurements at today's visit.  I have told the patient to call our office in the future if her new compression stockings do not help her and if she would like a prescription for lymphedema pumps. Her right ankle measures 24cm and right calf measures 39cm. Her left ankle measures 22cm and left calf measures 42cm. She can follow-up with our office as needed   Ernestene Mention, PA-C [facility] Office: [phone]  12/04/2022, 9:02 AM  Clinic MD: Steve Rattler

## 2022-11-29 LAB — URINE CULTURE
MICRO NUMBER:: 14794768
SPECIMEN QUALITY:: ADEQUATE

## 2022-11-29 MED ORDER — SULFAMETHOXAZOLE-TRIMETHOPRIM 800-160 MG PO TABS: 1.0000 | ORAL_TABLET | Freq: Two times a day (BID) | ORAL | 0 refills | Status: DC

## 2022-11-29 NOTE — Addendum Note (Signed)
Addended byChristen Butter on: 11/29/2022 02:28 PM   Modules accepted: Orders

## 2023-02-21 ENCOUNTER — Ambulatory Visit
Admission: EM | Admit: 2023-02-21 | Discharge: 2023-02-21 | Disposition: A | Discharge status: Home / Self Care | Payer: Medicare Other | Attending: Family Medicine | Admitting: Family Medicine

## 2023-02-21 DIAGNOSIS — H6123 Impacted cerumen, bilateral: Secondary | ICD-10-CM

## 2023-02-21 NOTE — ED Triage Notes (Addendum)
[facility] presents to [facility] with co of decreased hearing starting this afternoon. Pt reports this has happened in past and she needed an ear lavage, bilateral cerumen impaction noted.

## 2023-02-21 NOTE — ED Provider Notes (Signed)
[facility]    CSN: [id number] Arrival date & time: 02/21/23  1855      History   Chief Complaint Chief Complaint  Patient presents with   Hearing Problem    HPI Khaleya Awadalla is a 89 y.o. female.   HPI PMH significant for CAD, chronic venous insufficiency, and osteoporosis.  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 Florentina Addison to evaluate late May 2015    Degeneration of lumbar or lumbosacral intervertebral disc    History of thyroid cancer    With reported brain mets.    Hyperlipidemia    Hypothyroid    Outside records report synthroid despite her report of . 02/2014 awaiting clarification    Idiopathic scoliosis    Osteoporosis    July 2014 DEXA    PNEUMONIA, COMMUNITY ACQUIRED, PNEUMOCOCCAL    Venous stasis ulcer (HCC)     Patient Active Problem List   Diagnosis Date Noted   Peripheral edema 08/26/2021   Pancreas cyst - needs follow up CT 09/2022 (see CT 09/2020) 09/28/2020   Pseudomonas aeruginosa infection 08/30/2020   Stasis edema, right 07/01/2020   Wound infection 11/27/2019   Melanoma (HCC) 08/17/2017   Mohs defect 08/17/2017   History of craniotomy 08/17/2017   History of thyroidectomy 08/17/2017   HTN (hypertension) 08/17/2017   Hypothyroidism 08/17/2017   Osteopenia 08/17/2017   Skin cancer 08/17/2017   Decreased thyroid stimulating hormone (TSH) level 07/19/2016   Skin ulcer of left ankle with fat layer exposed (HCC) 07/05/2016   Filamentary keratitis of right eye 06/26/2016   Keratoconjunctivitis sicca due to decreased tear production, bilateral 06/26/2016   Blepharitis of both eyes 06/26/2016   Atopic conjunctivitis of both eyes 06/26/2016   Cerumen impaction 04/17/2016   Hair loss 04/17/2016   Breast cancer of  upper-inner quadrant of left female breast (HCC) 10/12/2015   Pruritus 08/06/2015   Rhinorrhea 12/15/2014   Chest pain 12/02/2014   Hypothyroidism, postsurgical 07/21/2014   CAD (coronary artery disease) 04/29/2014   Abnormal finding on MRI of brain 04/14/2014   Malignant neoplasm of thyroid gland (HCC) 04/14/2014   History of thyroid cancer 03/27/2014   Chronic venous insufficiency 12/23/2013   Degeneration of lumbar or lumbosacral intervertebral disc 10/08/2013   Idiopathic scoliosis 10/08/2013   Osteoporosis 10/06/2013   Acute stasis dermatitis of right lower extremity 10/02/2013   Essential hypertension, benign 08/18/2013   Venous stasis ulcer of left lower extremity (HCC) 08/06/2013   Grief 08/06/2013   Anxiety 11/23/2010   Pure hypercholesterolemia 11/23/2010   Elevated triglycerides with high cholesterol 08/15/2010    Past Surgical History:  Procedure Laterality Date   BREAST LUMPECTOMY WITH RADIOACTIVE SEED LOCALIZATION Left 03/29/2016   Procedure: LEFT BREAST LUMPECTOMY WITH RADIOACTIVE SEED LOCALIZATION;  Surgeon: Chevis Pretty III, MD;  Location: [facility];  Service: General;  Laterality: Left;  LEFT BREAST LUMPECTOMY WITH RADIOACTIVE SEED LOCALIZATION   cranotomy     THYROIDECTOMY      OB History   No obstetric history on file.      Home Medications    Prior to Admission medications   Medication Sig Start Date End Date Taking? Authorizing Provider  AMBULATORY NON FORMULARY MEDICATION Knee-high, medium compression, graduated compression stockings. Apply to lower extremities. [email or web address], Zippered  Compression Stockings, medium circ, long length 08/15/22   Christen Butter, NP  aspirin 81 MG tablet Take 81 mg by mouth daily.    [provider]  metoprolol succinate (TOPROL-XL) 25 MG 24 hr tablet TAKE ONE HALF TABLET (12.5MG ) BY MOUTH EVERY OTHER DAY 06/05/22   Christen Butter, NP  sulfamethoxazole-trimethoprim (BACTRIM DS) 800-160 MG tablet  Take 1 tablet by mouth 2 (two) times daily. 11/29/22   Christen Butter, NP  SYNTHROID 150 MCG tablet TAKE 1 TABLET(150 MCG) BY MOUTH DAILY 09/13/20   Early, Sung Amabile, NP  Vibegron (GEMTESA) 75 MG TABS Take 1 tablet by mouth daily. 07/27/21   [provider]    Family History Family History  Problem Relation Age of Onset   CAD Sister    Cancer Sister    Heart disease Sister    Diabetes Father    COPD Father     Social History Social History   Tobacco Use   Smoking status: Never   Smokeless tobacco: Never  Vaping Use   Vaping Use: Never used  Substance Use Topics   Alcohol use: Yes    Alcohol/week: 0.0 standard drinks of alcohol    Comment: Occasional   Drug use: No     Allergies   Neomy-bacit-polymyx-pramoxine, Amoxicillin, Doxycycline, Levofloxacin, Statins, Colesevelam, Neomycin, Red dye, and Tape   Review of Systems Review of Systems   Physical Exam Triage Vital Signs ED Triage Vitals  Enc Vitals Group     BP      Pulse      Resp      Temp      Temp src      SpO2      Weight      Height      Head Circumference      Peak Flow      Pain Score      Pain Loc      Pain Edu?      Excl. in GC?    No data found.  Updated Vital Signs BP 116/85   Temp 97.9 F (36.6 C)   Resp 16   SpO2 98%      Physical Exam Vitals and nursing note reviewed.  Constitutional:      Appearance: Normal appearance. She is normal weight.  HENT:     Head: Normocephalic and atraumatic.     Right Ear: External ear normal.     Left Ear: External ear normal.     Ears:     Comments: Bilateral EAC's occluded with excessive cerumen unable to visualize either TM; post bilateral ear lavage: Left EAC-clear, left TM-clear with good light reflex; right EAC-occluded with cerumen    Mouth/Throat:     Mouth: Mucous membranes are moist.     Pharynx: Oropharynx is clear.  Eyes:     Extraocular Movements: Extraocular movements intact.     Conjunctiva/sclera: Conjunctivae normal.      Pupils: Pupils are equal, round, and reactive to light.  Cardiovascular:     Rate and Rhythm: Normal rate and regular rhythm.     Pulses: Normal pulses.     Heart sounds: Normal heart sounds.  Pulmonary:     Effort: Pulmonary effort is normal.     Breath sounds: Normal breath sounds. No wheezing, rhonchi or rales.  Musculoskeletal:        General: Normal range of motion.     Cervical back: Normal range of motion and neck supple.  Skin:  General: Skin is warm and dry.  Neurological:     General: No focal deficit present.     Mental Status: She is alert and oriented to person, place, and time. Mental status is at baseline.  Psychiatric:        Mood and Affect: Mood normal.        Behavior: Behavior normal.      UC Treatments / Results  Labs (all labs ordered are listed, but only abnormal results are displayed) Labs Reviewed - No data to display  EKG   Radiology No results found.  Procedures Procedures (including critical care time)  Medications Ordered in UC Medications - No data to display  Initial Impression / Assessment and Plan / UC Course  I have reviewed the triage vital signs and the nursing notes.  Pertinent labs & imaging results that were available during my care of the patient were reviewed by me and considered in my medical decision making (see chart for details).     MDM: 1. Bilateral impacted cerumen-ear lavage successful for left EAC only. Advised patient to follow-up with PCP or ENT for further evaluation of right ear cerumen impaction.  Advised patient that left ear is completely free of cerumen after lavage this evening.  Patient discharged home, hemodynamically stable. Final Clinical Impressions(s) / UC Diagnoses   Final diagnoses:  Bilateral impacted cerumen     Discharge Instructions      Advised patient to follow-up with PCP or ENT for further evaluation of right ear cerumen impaction.  Advised patient that left ear is completely free of  cerumen after lavage this evening.     ED Prescriptions   None    PDMP not reviewed this encounter.   Trevor Iha, FNP 02/21/23 1950

## 2023-02-21 NOTE — Discharge Instructions (Addendum)
Advised patient to follow-up with PCP or ENT for further evaluation of right ear cerumen impaction.  Advised patient that left ear is completely free of cerumen after lavage this evening.

## 2023-04-19 ENCOUNTER — Ambulatory Visit
Admission: EM | Admit: 2023-04-19 | Discharge: 2023-04-19 | Disposition: A | Discharge status: Home / Self Care | Payer: Medicare Other | Attending: Family Medicine | Admitting: Family Medicine

## 2023-04-19 ENCOUNTER — Encounter: Payer: Self-pay | Admitting: Emergency Medicine

## 2023-04-19 DIAGNOSIS — N3001 Acute cystitis with hematuria: Secondary | ICD-10-CM | POA: Diagnosis present

## 2023-04-19 LAB — POCT URINALYSIS DIP (MANUAL ENTRY)
Bilirubin, UA: NEGATIVE
Glucose, UA: NEGATIVE mg/dL
Nitrite, UA: POSITIVE — AB
Spec Grav, UA: 1.02 (ref 1.010–1.025)
Urobilinogen, UA: 2 E.U./dL — AB
pH, UA: 6 (ref 5.0–8.0)

## 2023-04-19 MED ORDER — SULFAMETHOXAZOLE-TRIMETHOPRIM 800-160 MG PO TABS: 1.0000 | ORAL_TABLET | Freq: Two times a day (BID) | ORAL | 0 refills | Status: AC

## 2023-04-19 NOTE — ED Triage Notes (Addendum)
Patient presents to Urgent Care with complaints of pelvic pressure and blood in urine since 4 days ago. Patient reports she sees Urology for incontinence was advised to have urine monitored. Patient reports having worsening of pelvic pain/pressure and cramping with movement. Denies any low back pain. She goes to the bathroom every 2 hrs to prevent having an accident

## 2023-04-19 NOTE — ED Provider Notes (Signed)
[facility]    CSN: [id number] Arrival date & time: 04/19/23  1556      History   Chief Complaint Chief Complaint  Patient presents with   Hematuria   Pelvic Pain    HPI Kristy Olson is a 89 y.o. female.   HPI Very pleasant 89 year old female presents with pelvic pressure and blood in urine for 4 days. She reports that she sees Urology for incontinence and has been advised to monitor urine. PMH significant for malignant neoplasm of thyroid gland, HTN, and CAD.  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 Florentina Addison to evaluate late May 2015    Degeneration of lumbar or lumbosacral intervertebral disc    History of thyroid cancer    With reported brain mets.    Hyperlipidemia    Hypothyroid    Outside records report synthroid despite her report of . 02/2014 awaiting clarification    Idiopathic scoliosis    Osteoporosis    July 2014 DEXA    PNEUMONIA, COMMUNITY ACQUIRED, PNEUMOCOCCAL    Venous stasis ulcer (HCC)     Patient Active Problem List   Diagnosis Date Noted   Peripheral edema 08/26/2021   Pancreas cyst - needs follow up CT 09/2022 (see CT 09/2020) 09/28/2020   Pseudomonas aeruginosa infection 08/30/2020   Stasis edema, right 07/01/2020   Wound infection 11/27/2019   Melanoma (HCC) 08/17/2017   Mohs defect 08/17/2017   History of craniotomy 08/17/2017   History of thyroidectomy 08/17/2017   HTN (hypertension) 08/17/2017   Hypothyroidism 08/17/2017   Osteopenia 08/17/2017   Skin cancer 08/17/2017   Decreased thyroid stimulating hormone (TSH) level 07/19/2016   Skin ulcer of left ankle with fat layer exposed (HCC) 07/05/2016   Filamentary keratitis of right eye 06/26/2016   Keratoconjunctivitis sicca due to decreased tear  production, bilateral 06/26/2016   Blepharitis of both eyes 06/26/2016   Atopic conjunctivitis of both eyes 06/26/2016   Cerumen impaction 04/17/2016   Hair loss 04/17/2016   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   Pruritus 08/06/2015   Rhinorrhea 12/15/2014   Chest pain 12/02/2014   Hypothyroidism, postsurgical 07/21/2014   CAD (coronary artery disease) 04/29/2014   Abnormal finding on MRI of brain 04/14/2014   Malignant neoplasm of thyroid gland (HCC) 04/14/2014   History of thyroid cancer 03/27/2014   Chronic venous insufficiency 12/23/2013   Degeneration of lumbar or lumbosacral intervertebral disc 10/08/2013   Idiopathic scoliosis 10/08/2013   Osteoporosis 10/06/2013   Acute stasis dermatitis of right lower extremity 10/02/2013   Essential hypertension, benign 08/18/2013   Venous stasis ulcer of left lower extremity (HCC) 08/06/2013   Grief 08/06/2013   Anxiety 11/23/2010   Pure hypercholesterolemia 11/23/2010   Elevated triglycerides with high cholesterol 08/15/2010    Past Surgical History:  Procedure Laterality Date   BREAST LUMPECTOMY WITH RADIOACTIVE SEED LOCALIZATION Left 03/29/2016   Procedure: LEFT BREAST LUMPECTOMY WITH RADIOACTIVE SEED LOCALIZATION;  Surgeon: Chevis Pretty III, MD;  Location: [facility];  Service: General;  Laterality: Left;  LEFT BREAST LUMPECTOMY WITH RADIOACTIVE SEED LOCALIZATION   cranotomy     THYROIDECTOMY      OB History   No obstetric history on file.      Home Medications  Prior to Admission medications   Medication Sig Start Date End Date Taking? Authorizing Provider  aspirin 81 MG tablet Take 81 mg by mouth daily.   Yes [provider]  metoprolol succinate (TOPROL-XL) 25 MG 24 hr tablet TAKE ONE HALF TABLET (12.5MG ) BY MOUTH EVERY OTHER DAY 06/05/22  Yes Christen Butter, NP  sulfamethoxazole-trimethoprim (BACTRIM DS) 800-160 MG tablet Take 1 tablet by mouth 2 (two) times  daily for 7 days. 04/19/23 04/26/23 Yes Trevor Iha, FNP  SYNTHROID 150 MCG tablet TAKE 1 TABLET(150 MCG) BY MOUTH DAILY 09/13/20  Yes Early, Sung Amabile, NP  Vibegron (GEMTESA) 75 MG TABS Take 1 tablet by mouth daily. 07/27/21  Yes [provider]  AMBULATORY NON FORMULARY MEDICATION Knee-high, medium compression, graduated compression stockings. Apply to lower extremities. [email or web address], Zippered Compression Stockings, medium circ, long length 08/15/22   Christen Butter, NP    Family History Family History  Problem Relation Age of Onset   CAD Sister    Cancer Sister    Heart disease Sister    Diabetes Father    COPD Father     Social History Social History   Tobacco Use   Smoking status: Never   Smokeless tobacco: Never  Vaping Use   Vaping status: Never Used  Substance Use Topics   Alcohol use: Yes    Alcohol/week: 0.0 standard drinks of alcohol    Comment: Occasional   Drug use: No     Allergies   Neomy-bacit-polymyx-pramoxine, Amoxicillin, Doxycycline, Levofloxacin, Statins, Colesevelam, Neomycin, Red dye #40 (allura red), and Tape   Review of Systems Review of Systems  Genitourinary:  Positive for dysuria, frequency and urgency.  All other systems reviewed and are negative.    Physical Exam Triage Vital Signs ED Triage Vitals [04/19/23 1609]  Encounter Vitals Group     BP      Systolic BP Percentile      Diastolic BP Percentile      Pulse      Resp      Temp      Temp src      SpO2      Weight      Height      Head Circumference      Peak Flow      Pain Score 5     Pain Loc      Pain Education      Exclude from Growth Chart    No data found.  Updated Vital Signs BP 123/64 (BP Location: Right Arm)   Pulse 62   Temp 98.5 F (36.9 C) (Oral)   Resp 16   SpO2 94%       Physical Exam Vitals and nursing note reviewed.  Constitutional:      Appearance: Normal appearance. She is normal weight.  HENT:     Head: Normocephalic and  atraumatic.     Mouth/Throat:     Mouth: Mucous membranes are moist.     Pharynx: Oropharynx is clear.  Eyes:     Extraocular Movements: Extraocular movements intact.     Conjunctiva/sclera: Conjunctivae normal.     Pupils: Pupils are equal, round, and reactive to light.  Cardiovascular:     Rate and Rhythm: Normal rate and regular rhythm.     Pulses: Normal pulses.     Heart sounds: Normal heart sounds.  Pulmonary:     Effort: Pulmonary effort is normal.     Breath sounds: Normal breath sounds. No wheezing, rhonchi or rales.  Musculoskeletal:        General: Normal range of motion.     Cervical back: Normal range of motion and neck supple.  Skin:    General: Skin is warm and dry.  Neurological:     General: No focal deficit present.     Mental Status: She is alert and oriented to person, place, and time. Mental status is at baseline.  Psychiatric:        Mood and Affect: Mood normal.        Behavior: Behavior normal.      UC Treatments / Results  Labs (all labs ordered are listed, but only abnormal results are displayed) Labs Reviewed  POCT URINALYSIS DIP (MANUAL ENTRY) - Abnormal; Notable for the following components:      Result Value   Clarity, UA cloudy (*)    Ketones, POC UA trace (5) (*)    Blood, UA small (*)    Protein Ur, POC trace (*)    Urobilinogen, UA 2.0 (*)    Nitrite, UA Positive (*)    Leukocytes, UA Large (3+) (*)    All other components within normal limits  URINE CULTURE    EKG   Radiology No results found.  Procedures Procedures (including critical care time)  Medications Ordered in UC Medications - No data to display  Initial Impression / Assessment and Plan / UC Course  I have reviewed the triage vital signs and the nursing notes.  Pertinent labs & imaging results that were available during my care of the patient were reviewed by me and considered in my medical decision making (see chart for details).     MDM: 1.  Acute  cystitis with hematuria-Rx'd Bactrim DS 800/160 mg tablet twice daily x 7 days. Advised patient that she had a urinary tract infection today.  Instructed patient to take medication with food to completion. 2.  Urinary frequency-UA revealed above, urine culture ordered, Rx'd Bactrim DS 800/160 mg tablet twice daily x 7 days.  Encouraged patient to increase daily water intake to 64 ounces per day while taking this medication.  Advised we will follow-up with urine culture results once received.  Advised if symptoms worsen and/or unresolved please follow-up with PCP or here for further evaluation.    Final Clinical Impressions(s) / UC Diagnoses   Final diagnoses:  Acute cystitis with hematuria     Discharge Instructions      Advised patient that she had a urinary tract infection today.  Instructed patient to take medication with food to completion.  Encouraged to increase daily water intake to 64 ounces per day while taking this medication.  Advised we will follow-up with urine culture results once received.  Advised if symptoms worsen and/or unresolved please follow-up with PCP or here for further evaluation.     ED Prescriptions     Medication Sig Dispense Auth. Provider   sulfamethoxazole-trimethoprim (BACTRIM DS) 800-160 MG tablet Take 1 tablet by mouth 2 (two) times daily for 7 days. 14 tablet Trevor Iha, FNP      PDMP not reviewed this encounter.   Trevor Iha, FNP 04/19/23 1704

## 2023-04-19 NOTE — Discharge Instructions (Addendum)
Advised patient that she had a urinary tract infection today.  Instructed patient to take medication with food to completion.  Encouraged to increase daily water intake to 64 ounces per day while taking this medication.  Advised we will follow-up with urine culture results once received.  Advised if symptoms worsen and/or unresolved please follow-up with PCP or here for further evaluation.

## 2023-04-22 LAB — URINE CULTURE: Culture: 80000 — AB

## 2023-05-17 ENCOUNTER — Ambulatory Visit
Admission: EM | Admit: 2023-05-17 | Discharge: 2023-05-17 | Disposition: A | Discharge status: Home / Self Care | Payer: Medicare Other | Attending: Family Medicine | Admitting: Family Medicine

## 2023-05-17 DIAGNOSIS — N3001 Acute cystitis with hematuria: Secondary | ICD-10-CM | POA: Diagnosis not present

## 2023-05-17 DIAGNOSIS — R300 Dysuria: Secondary | ICD-10-CM

## 2023-05-17 DIAGNOSIS — N3000 Acute cystitis without hematuria: Secondary | ICD-10-CM | POA: Diagnosis not present

## 2023-05-17 DIAGNOSIS — R1030 Lower abdominal pain, unspecified: Secondary | ICD-10-CM | POA: Diagnosis present

## 2023-05-17 LAB — POCT URINALYSIS DIP (MANUAL ENTRY)
Bilirubin, UA: NEGATIVE
Glucose, UA: NEGATIVE mg/dL
Nitrite, UA: POSITIVE — AB
Protein Ur, POC: NEGATIVE mg/dL
Spec Grav, UA: 1.025 (ref 1.010–1.025)
Urobilinogen, UA: 1 E.U./dL
pH, UA: 5.5 (ref 5.0–8.0)

## 2023-05-17 MED ORDER — SULFAMETHOXAZOLE-TRIMETHOPRIM 800-160 MG PO TABS: 1.0000 | ORAL_TABLET | Freq: Two times a day (BID) | ORAL | 0 refills | Status: AC

## 2023-05-17 NOTE — ED Triage Notes (Signed)
Patient presents with lower abdominal pain x 3 days. Pt reports Dysuria x 3 days. Denies any fever, nausea or vomiting.

## 2023-05-17 NOTE — ED Provider Notes (Signed)
[facility]    CSN: [id number] Arrival date & time: 05/17/23  1557      History   Chief Complaint Chief Complaint  Patient presents with   Recurrent UTI   Dysuria    HPI Bona Ravi is a 89 y.o. female.   HPI very pleasant 88 year old female presents with lower abdominal pain and dysuria for 3 days.  PMH significant for chronic venous insufficiency, osteoporosis, and idiopathic scoliosis.  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 Florentina Addison to evaluate late May 2015    Degeneration of lumbar or lumbosacral intervertebral disc    History of thyroid cancer    With reported brain mets.    Hyperlipidemia    Hypothyroid    Outside records report synthroid despite her report of . 02/2014 awaiting clarification    Idiopathic scoliosis    Osteoporosis    July 2014 DEXA    PNEUMONIA, COMMUNITY ACQUIRED, PNEUMOCOCCAL    Venous stasis ulcer (HCC)     Patient Active Problem List   Diagnosis Date Noted   Peripheral edema 08/26/2021   Pancreas cyst - needs follow up CT 09/2022 (see CT 09/2020) 09/28/2020   Pseudomonas aeruginosa infection 08/30/2020   Stasis edema, right 07/01/2020   Wound infection 11/27/2019   Melanoma (HCC) 08/17/2017   Mohs defect 08/17/2017   History of craniotomy 08/17/2017   History of thyroidectomy 08/17/2017   HTN (hypertension) 08/17/2017   Hypothyroidism 08/17/2017   Osteopenia 08/17/2017   Skin cancer 08/17/2017   Decreased thyroid stimulating hormone (TSH) level 07/19/2016   Skin ulcer of left ankle with fat layer exposed (HCC) 07/05/2016   Filamentary keratitis of right eye 06/26/2016   Keratoconjunctivitis sicca due to decreased tear production, bilateral 06/26/2016   Blepharitis of both eyes 06/26/2016    Atopic conjunctivitis of both eyes 06/26/2016   Cerumen impaction 04/17/2016   Hair loss 04/17/2016   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   Pruritus 08/06/2015   Rhinorrhea 12/15/2014   Chest pain 12/02/2014   Hypothyroidism, postsurgical 07/21/2014   CAD (coronary artery disease) 04/29/2014   Abnormal finding on MRI of brain 04/14/2014   Malignant neoplasm of thyroid gland (HCC) 04/14/2014   History of thyroid cancer 03/27/2014   Chronic venous insufficiency 12/23/2013   Degeneration of lumbar or lumbosacral intervertebral disc 10/08/2013   Idiopathic scoliosis 10/08/2013   Osteoporosis 10/06/2013   Acute stasis dermatitis of right lower extremity 10/02/2013   Essential hypertension, benign 08/18/2013   Venous stasis ulcer of left lower extremity (HCC) 08/06/2013   Grief 08/06/2013   Anxiety 11/23/2010   Pure hypercholesterolemia 11/23/2010   Elevated triglycerides with high cholesterol 08/15/2010    Past Surgical History:  Procedure Laterality Date   BREAST LUMPECTOMY WITH RADIOACTIVE SEED LOCALIZATION Left 03/29/2016   Procedure: LEFT BREAST LUMPECTOMY WITH RADIOACTIVE SEED LOCALIZATION;  Surgeon: Chevis Pretty III, MD;  Location: [facility];  Service: General;  Laterality: Left;  LEFT BREAST LUMPECTOMY WITH RADIOACTIVE SEED LOCALIZATION   cranotomy     THYROIDECTOMY      OB History   No obstetric history on file.      Home Medications    Prior to Admission medications   Medication Sig Start Date End Date Taking?  Authorizing Provider  AMBULATORY NON FORMULARY MEDICATION Knee-high, medium compression, graduated compression stockings. Apply to lower extremities. [email or web address], Zippered Compression Stockings, medium circ, long length 08/15/22  Yes Jessup, Joy, NP  aspirin 81 MG tablet Take 81 mg by mouth daily.   Yes [provider]  metoprolol succinate (TOPROL-XL) 25 MG 24 hr tablet TAKE ONE HALF TABLET  (12.5MG ) BY MOUTH EVERY OTHER DAY 06/05/22  Yes Christen Butter, NP  sulfamethoxazole-trimethoprim (BACTRIM DS) 800-160 MG tablet Take 1 tablet by mouth 2 (two) times daily for 7 days. 05/17/23 05/24/23 Yes Trevor Iha, FNP  SYNTHROID 150 MCG tablet TAKE 1 TABLET(150 MCG) BY MOUTH DAILY 09/13/20  Yes Early, Sung Amabile, NP  Vibegron (GEMTESA) 75 MG TABS Take 1 tablet by mouth daily. 07/27/21  Yes [provider]    Family History Family History  Problem Relation Age of Onset   CAD Sister    Cancer Sister    Heart disease Sister    Diabetes Father    COPD Father     Social History Social History   Tobacco Use   Smoking status: Never   Smokeless tobacco: Never  Vaping Use   Vaping status: Never Used  Substance Use Topics   Alcohol use: Yes    Alcohol/week: 0.0 standard drinks of alcohol    Comment: Occasional   Drug use: No     Allergies   Neomy-bacit-polymyx-pramoxine, Amoxicillin, Doxycycline, Levofloxacin, Statins, Colesevelam, Neomycin, Red dye #40 (allura red), and Tape   Review of Systems Review of Systems  Genitourinary:  Positive for dysuria and frequency.     Physical Exam Triage Vital Signs ED Triage Vitals [05/17/23 1610]  Encounter Vitals Group     BP (!) 148/66     Systolic BP Percentile      Diastolic BP Percentile      Pulse Rate 64     Resp 18     Temp 97.8 F (36.6 C)     Temp Source Oral     SpO2 98 %     Weight      Height      Head Circumference      Peak Flow      Pain Score      Pain Loc      Pain Education      Exclude from Growth Chart    No data found.  Updated Vital Signs BP (!) 148/66 (BP Location: Left Arm)   Pulse 64   Temp 97.8 F (36.6 C) (Oral)   Resp 18   SpO2 98%    Physical Exam Vitals and nursing note reviewed.  Constitutional:      General: She is not in acute distress.    Appearance: Normal appearance. She is normal weight. She is not ill-appearing.  HENT:     Head: Normocephalic and atraumatic.      Mouth/Throat:     Mouth: Mucous membranes are moist.     Pharynx: Oropharynx is clear.  Eyes:     Extraocular Movements: Extraocular movements intact.     Conjunctiva/sclera: Conjunctivae normal.     Pupils: Pupils are equal, round, and reactive to light.  Cardiovascular:     Rate and Rhythm: Normal rate and regular rhythm.     Pulses: Normal pulses.     Heart sounds: Normal heart sounds.  Pulmonary:     Effort: Pulmonary effort is normal.     Breath sounds: Normal breath sounds. No wheezing, rhonchi or rales.  Abdominal:  Tenderness: There is no right CVA tenderness or left CVA tenderness.  Musculoskeletal:        General: Normal range of motion.     Cervical back: Normal range of motion and neck supple.  Skin:    General: Skin is warm and dry.  Neurological:     General: No focal deficit present.     Mental Status: She is alert and oriented to person, place, and time. Mental status is at baseline.  Psychiatric:        Mood and Affect: Mood normal.        Behavior: Behavior normal.      UC Treatments / Results  Labs (all labs ordered are listed, but only abnormal results are displayed) Labs Reviewed  POCT URINALYSIS DIP (MANUAL ENTRY) - Abnormal; Notable for the following components:      Result Value   Ketones, POC UA trace (5) (*)    Blood, UA trace-intact (*)    Nitrite, UA Positive (*)    Leukocytes, UA Large (3+) (*)    All other components within normal limits  URINE CULTURE    EKG   Radiology No results found.  Procedures Procedures (including critical care time)  Medications Ordered in UC Medications - No data to display  Initial Impression / Assessment and Plan / UC Course  I have reviewed the triage vital signs and the nursing notes.  Pertinent labs & imaging results that were available during my care of the patient were reviewed by me and considered in my medical decision making (see chart for details).     MDM: 1.  Acute cystitis with  hematuria-Rx'd Bactrim DS 800/160 mg tablet: Take 1 tablet p.o. twice daily x 7 days. 2.  Dysuria-UA revealed above, urine culture ordered, Rx'd Bactrim DS 800/160 mg tablet: Take 1 tablet p.o. twice daily x 7 days. Advised patient to take medication as directed with food to completion.  Encouraged to increase daily water intake to 32 to 64 ounces per day while taking this medication.  Advised we will follow-up with urine culture results once received.  Advised if symptoms worsen and/or unresolved please follow-up with PCP or here for further evaluation.  Patient discharged home, hemodynamically stable. Final Clinical Impressions(s) / UC Diagnoses   Final diagnoses:  Dysuria  Acute cystitis without hematuria     Discharge Instructions      Advised patient to take medication as directed with food to completion.  Encouraged to increase daily water intake to 32 to 64 ounces per day while taking this medication.  Advised we will follow-up with urine culture results once received.  Advised if symptoms worsen and/or unresolved please follow-up with PCP or here for further evaluation.     ED Prescriptions     Medication Sig Dispense Auth. Provider   sulfamethoxazole-trimethoprim (BACTRIM DS) 800-160 MG tablet Take 1 tablet by mouth 2 (two) times daily for 7 days. 14 tablet Trevor Iha, FNP      PDMP not reviewed this encounter.   Trevor Iha, FNP 05/17/23 1710

## 2023-05-17 NOTE — Discharge Instructions (Addendum)
Advised patient to take medication as directed with food to completion.  Encouraged to increase daily water intake to 32 to 64 ounces per day while taking this medication.  Advised we will follow-up with urine culture results once received.  Advised if symptoms worsen and/or unresolved please follow-up with PCP or here for further evaluation.

## 2023-05-20 ENCOUNTER — Telehealth: Payer: Self-pay

## 2023-05-20 LAB — URINE CULTURE: Culture: >=100000 — AB

## 2023-05-20 MED ORDER — FOSFOMYCIN TROMETHAMINE 3 G PO PACK: 3.0000 g | PACK | Freq: Once | ORAL | 0 refills | Status: AC

## 2023-05-20 NOTE — Telephone Encounter (Signed)
Per protocol, pt to dc Bactrim.  Per J. Ward, PA-C, "Fosfomycin single dose, 3g." Attempted to reach patient x1. LVM.  Rx sent to pharmacy on file.

## 2023-05-23 ENCOUNTER — Telehealth: Payer: Self-pay | Admitting: General Practice

## 2023-05-23 NOTE — Transitions of Care (Post Inpatient/ED Visit) (Signed)
05/23/2023  Name: Kristy Olson MRN: [id number] DOB: 08/07/1932  Today's TOC FU Call Status: Today's TOC FU Call Status:: Unsuccessful Call (1st Attempt) Unsuccessful Call (1st Attempt) Date: 05/23/23  Attempted to reach the patient regarding the most recent Inpatient/ED visit.  Follow Up Plan: Additional outreach attempts will be made to reach the patient to complete the Transitions of Care (Post Inpatient/ED visit) call.   Signature Modesto Charon, Control and instrumentation engineer

## 2023-05-28 NOTE — Transitions of Care (Post Inpatient/ED Visit) (Signed)
05/28/2023  Name: Kristy Olson MRN: [id number] DOB: 06/29/1932  Today's TOC FU Call Status: Today's TOC FU Call Status:: Unsuccessful Call (2nd Attempt) Unsuccessful Call (1st Attempt) Date: 05/23/23 Unsuccessful Call (2nd Attempt) Date: 05/28/23  Attempted to reach the patient regarding the most recent Inpatient/ED visit.  Follow Up Plan: Additional outreach attempts will be made to reach the patient to complete the Transitions of Care (Post Inpatient/ED visit) call.   Signature Modesto Charon, Control and instrumentation engineer

## 2023-05-29 NOTE — Transitions of Care (Post Inpatient/ED Visit) (Signed)
05/29/2023  Name: Kristy Olson MRN: [id number] DOB: Apr 15, 1932  Today's TOC FU Call Status: Today's TOC FU Call Status:: Unsuccessful Call (3rd Attempt) Unsuccessful Call (1st Attempt) Date: 05/23/23 Unsuccessful Call (2nd Attempt) Date: 05/28/23 Unsuccessful Call (3rd Attempt) Date: 05/29/23  Attempted to reach the patient regarding the most recent Inpatient/ED visit.  Follow Up Plan: No further outreach attempts will be made at this time. We have been unable to contact the patient.  Signature Modesto Charon, Control and instrumentation engineer

## 2023-06-09 ENCOUNTER — Telehealth: Payer: Self-pay

## 2023-06-09 NOTE — Telephone Encounter (Signed)
RC from pt, ID verified. Pt RC about starting Fosfomycin. Pt states she is doing well at this time and urinary sxs have resolved. Pt placed on phone to verifiy necessity for abx at this time. Pt ended call. ATC/UTR LVM for pt to adv per Ashlee Hermanns, PA pt should take Fosfomycin to ensure clearing of bacteria in urine.

## 2023-06-09 NOTE — Telephone Encounter (Signed)
RC from pt, ID verified. Adv pt per Sharin Mons, PA to p/u and take Fosfomycin sent to pharmacy, agrees. No add'l concerns. TC to [facility] to verify Fosfomycin Rx still active for pt to p/u.

## 2023-07-16 ENCOUNTER — Other Ambulatory Visit: Payer: Self-pay | Admitting: Medical-Surgical

## 2023-07-16 DIAGNOSIS — I2510 Atherosclerotic heart disease of native coronary artery without angina pectoris: Secondary | ICD-10-CM

## 2023-08-06 ENCOUNTER — Other Ambulatory Visit: Payer: Self-pay | Admitting: Medical-Surgical

## 2023-08-06 DIAGNOSIS — I2510 Atherosclerotic heart disease of native coronary artery without angina pectoris: Secondary | ICD-10-CM

## 2023-08-21 ENCOUNTER — Other Ambulatory Visit: Payer: Self-pay | Admitting: Medical-Surgical

## 2023-08-21 DIAGNOSIS — I2510 Atherosclerotic heart disease of native coronary artery without angina pectoris: Secondary | ICD-10-CM

## 2024-02-08 ENCOUNTER — Ambulatory Visit: Admission: EM | Admit: 2024-02-08 | Discharge: 2024-02-08 | Disposition: A | Discharge status: Home / Self Care

## 2024-02-08 ENCOUNTER — Encounter: Payer: Self-pay | Admitting: Emergency Medicine

## 2024-02-08 DIAGNOSIS — K5900 Constipation, unspecified: Secondary | ICD-10-CM | POA: Diagnosis not present

## 2024-02-08 MED ORDER — DOCUSATE SODIUM 100 MG PO CAPS: 100.0000 mg | ORAL_CAPSULE | Freq: Every evening | ORAL | 0 refills | Status: DC | PRN

## 2024-02-08 MED ORDER — POLYETHYLENE GLYCOL 3350 17 G PO PACK: 17.0000 g | PACK | Freq: Every day | ORAL | 0 refills | Status: DC

## 2024-02-08 NOTE — ED Triage Notes (Addendum)
 Patient c/o constipation x 1 week.  She has not had a BM all week.  Patient has not tried any OTC stool softners or laxatives.  Patient states that she is not drinking a lot of water throughout the day, more tea which she drinks with her lunch and dinner.  She does try to drink water with her medication.

## 2024-02-08 NOTE — Discharge Instructions (Signed)
 Take Colace at bedtime with at least 8 ounces of water.  Take Colace daily at bedtime until he began to have regular stools. Drink MiraLAX 1 packet in a glass with 8 ounces of water or juice daily until you have a regular bowel movement then only take as needed.

## 2024-02-08 NOTE — ED Provider Notes (Signed)
 [facility]    CSN: [id number] Arrival date & time: 02/08/24  1446      History   Chief Complaint Chief Complaint  Patient presents with   Constipation    HPI Kristy Olson is a 89 y.o. female.   Patient presents today with reports of having her last bowel movement on Sunday or Monday of this week.  She reports her normal routine bowel habits are every 2 to 3 days.  Reports she does not have the urge to have a bowel movement.  Denies any abdominal pain.  Reports not taking any medication over-the-counter if she did not know what to take.  Patient also reports that she does not drink much fluids throughout the day.  Patient lives in an independent assisted living. 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 Glenda Landing to evaluate late May 2015    Degeneration of lumbar or lumbosacral intervertebral disc    History of thyroid  cancer    With reported brain mets.    Hyperlipidemia    Hypothyroid    Outside records report synthroid  despite her report of . 02/2014 awaiting clarification    Idiopathic scoliosis    Osteoporosis    July 2014 DEXA    PNEUMONIA, COMMUNITY ACQUIRED, PNEUMOCOCCAL    Venous stasis ulcer (HCC)     Patient Active Problem List   Diagnosis Date Noted   Peripheral edema 08/26/2021   Pancreas cyst - needs follow up CT 09/2022 (see CT 09/2020) 09/28/2020   Pseudomonas aeruginosa infection 08/30/2020   Stasis edema, right 07/01/2020   Wound infection 11/27/2019   Melanoma (HCC) 08/17/2017   Mohs defect 08/17/2017   History of craniotomy 08/17/2017   History of thyroidectomy 08/17/2017   HTN (hypertension) 08/17/2017   Hypothyroidism 08/17/2017   Osteopenia 08/17/2017   Skin cancer 08/17/2017   Decreased thyroid  stimulating  hormone (TSH) level 07/19/2016   Skin ulcer of left ankle with fat layer exposed (HCC) 07/05/2016   Filamentary keratitis of right eye 06/26/2016   Keratoconjunctivitis sicca due to decreased tear production, bilateral 06/26/2016   Blepharitis of both eyes 06/26/2016   Atopic conjunctivitis of both eyes 06/26/2016   Cerumen impaction 04/17/2016   Hair loss 04/17/2016   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   Pruritus 08/06/2015   Rhinorrhea 12/15/2014   Chest pain 12/02/2014   Hypothyroidism, postsurgical 07/21/2014   CAD (coronary artery disease) 04/29/2014   Abnormal finding on MRI of brain 04/14/2014   Malignant neoplasm of thyroid  gland (HCC) 04/14/2014   History of thyroid  cancer 03/27/2014   Chronic venous insufficiency 12/23/2013   Degeneration of lumbar or lumbosacral intervertebral disc 10/08/2013   Idiopathic scoliosis 10/08/2013   Osteoporosis 10/06/2013   Acute stasis dermatitis of right lower extremity 10/02/2013   Essential hypertension, benign 08/18/2013   Venous stasis ulcer of left lower extremity (HCC) 08/06/2013   Grief 08/06/2013   Anxiety 11/23/2010   Pure hypercholesterolemia 11/23/2010   Elevated triglycerides with high cholesterol 08/15/2010    Past Surgical History:  Procedure Laterality Date   BREAST LUMPECTOMY WITH RADIOACTIVE SEED LOCALIZATION Left 03/29/2016   Procedure: LEFT BREAST LUMPECTOMY WITH RADIOACTIVE SEED LOCALIZATION;  Surgeon: Lillette Reid III, MD;  Location: [facility];  Service: General;  Laterality: Left;  LEFT BREAST LUMPECTOMY WITH RADIOACTIVE SEED LOCALIZATION   cranotomy     THYROIDECTOMY      OB History   No obstetric history on file.      Home Medications    Prior to Admission medications   Medication Sig Start Date End Date Taking? Authorizing Provider  AMBULATORY NON FORMULARY MEDICATION Knee-high, medium compression, graduated compression stockings. Apply to lower  extremities. [email or web address], Zippered Compression Stockings, medium circ, long length 08/15/22  Yes Jessup, Joy, NP  aspirin 81 MG tablet Take 81 mg by mouth daily.   Yes [provider]  docusate sodium (COLACE) 100 MG capsule Take 1 capsule (100 mg total) by mouth at bedtime as needed for mild constipation or moderate constipation. 02/08/24  Yes Buena Carmine, NP  metoprolol  succinate (TOPROL -XL) 25 MG 24 hr tablet NEEDS APPOINTMENT FOR FURTHER REFILLS. TAKE ONE HALF TABLET (12.5MG ) BY MOUTH EVERY OTHER DAY 08/23/23  Yes Cherre Cornish, NP  polyethylene glycol (MIRALAX) 17 g packet Take 17 g by mouth daily before breakfast. 02/08/24  Yes Buena Carmine, NP  solifenacin (VESICARE) 5 MG tablet Take 5 mg by mouth daily. 02/07/24  Yes [provider]  SYNTHROID  150 MCG tablet TAKE 1 TABLET(150 MCG) BY MOUTH DAILY 09/13/20  Yes Early, Sara E, NP  Vibegron (GEMTESA) 75 MG TABS Take 1 tablet by mouth daily. 07/27/21  Yes [provider]    Family History Family History  Problem Relation Age of Onset   CAD Sister    Cancer Sister    Heart disease Sister    Diabetes Father    COPD Father     Social History Social History   Tobacco Use   Smoking status: Never   Smokeless tobacco: Never  Vaping Use   Vaping status: Never Used  Substance Use Topics   Alcohol use: Yes    Alcohol/week: 0.0 standard drinks of alcohol    Comment: Occasional   Drug use: No     Allergies   Neomy-bacit-polymyx-pramoxine, Amoxicillin, Doxycycline, Levofloxacin, Statins, Colesevelam, Neomycin, Red dye #40 (allura red), and Tape   Review of Systems Review of Systems  Gastrointestinal:  Positive for constipation.     Physical Exam Triage Vital Signs ED Triage Vitals  Encounter Vitals Group     BP 02/08/24 1459 (!) 118/49     Girls Systolic BP Percentile --      Girls Diastolic BP Percentile --      Boys Systolic BP Percentile --      Boys Diastolic BP Percentile --       Pulse Rate 02/08/24 1459 66     Resp 02/08/24 1459 18     Temp 02/08/24 1459 98.7 F (37.1 C)     Temp Source 02/08/24 1459 Oral     SpO2 02/08/24 1459 91 %     Weight --      Height --      Head Circumference --      Peak Flow --      Pain Score 02/08/24 1501 0     Pain Loc --      Pain Education --      Exclude from Growth Chart --    No data found.  Updated Vital Signs BP (!) 118/55 (BP Location: Right Arm)   Pulse 66   Temp 98.7 F (37.1 C) (Oral)   Resp 18   SpO2 94%   Visual Acuity Right Eye Distance:   Left Eye Distance:  Bilateral Distance:    Right Eye Near:   Left Eye Near:    Bilateral Near:     Physical Exam Vitals reviewed.  Constitutional:      Appearance: She is ill-appearing (chronically ill appearing).  HENT:     Head: Normocephalic and atraumatic.   Eyes:     Extraocular Movements: Extraocular movements intact.     Pupils: Pupils are equal, round, and reactive to light.    Cardiovascular:     Rate and Rhythm: Normal rate and regular rhythm.  Pulmonary:     Effort: Pulmonary effort is normal.     Breath sounds: Normal breath sounds.  Abdominal:     General: Bowel sounds are decreased. There is no distension.     Palpations: Abdomen is soft.     Tenderness: There is no abdominal tenderness.   Musculoskeletal:     Cervical back: Normal range of motion and neck supple.   Skin:    General: Skin is warm.   Neurological:     General: No focal deficit present.     Mental Status: She is alert.      UC Treatments / Results  Labs (all labs ordered are listed, but only abnormal results are displayed) Labs Reviewed - No data to display  EKG   Radiology No results found.  Procedures Procedures (including critical care time)  Medications Ordered in UC Medications - No data to display  Initial Impression / Assessment and Plan / UC Course  I have reviewed the triage vital signs and the nursing notes.  Pertinent labs &  imaging results that were available during my care of the patient were reviewed by me and considered in my medical decision making (see chart for details).    Constipation, mild, Colace at bedtime for stool softener effects, MiraLAX once daily as needed.  Patient advised to discontinue both medication when she resumed her normal bowel habits.  If symptoms worsen or do not improve follow up with primary care doctor.  If symptoms become severe and involve abdominal pain go to the nearest emergency department.  Final Clinical Impressions(s) / UC Diagnoses   Final diagnoses:  Constipation, unspecified constipation type     Discharge Instructions      Take Colace at bedtime with at least 8 ounces of water.  Take Colace daily at bedtime until he began to have regular stools. Drink MiraLAX 1 packet in a glass with 8 ounces of water or juice daily until you have a regular bowel movement then only take as needed.     ED Prescriptions     Medication Sig Dispense Auth. Provider   docusate sodium (COLACE) 100 MG capsule Take 1 capsule (100 mg total) by mouth at bedtime as needed for mild constipation or moderate constipation. 60 capsule Buena Carmine, NP   polyethylene glycol (MIRALAX) 17 g packet Take 17 g by mouth daily before breakfast. 14 each Buena Carmine, NP      PDMP not reviewed this encounter.   Buena Carmine, NP 02/08/24 [phone]

## 2024-03-18 ENCOUNTER — Other Ambulatory Visit: Payer: Self-pay | Admitting: Medical-Surgical

## 2024-03-18 DIAGNOSIS — I2510 Atherosclerotic heart disease of native coronary artery without angina pectoris: Secondary | ICD-10-CM

## 2024-06-23 ENCOUNTER — Other Ambulatory Visit: Payer: Self-pay | Admitting: Medical-Surgical

## 2024-06-23 DIAGNOSIS — I2510 Atherosclerotic heart disease of native coronary artery without angina pectoris: Secondary | ICD-10-CM

## 2024-08-29 ENCOUNTER — Ambulatory Visit: Payer: Self-pay

## 2024-08-29 NOTE — Telephone Encounter (Signed)
 FYI Only or Action Required?: FYI only for provider: appointment scheduled on 09/02/24.  Patient was last seen in primary care on 11/27/2022 by [name], NP.  Called Nurse Triage reporting Hand Problem and Blister.  Symptoms began several months ago.  Interventions attempted: Nothing.  Symptoms are: gradually worsening.  Triage Disposition: See PCP Within 2 Weeks (overriding See Physician Within 24 Hours)  Patient/caregiver understands and will follow disposition?: Yes    Recurring boil that keeps popping and coming back for several months on right thumb. Boil looks clear. Smaller than her pinky nail. When touching it pain is 5-6/10. No fever. Hands also will feel like they lock up. Pt thinks it may be r/t arthritis. Scheduled appt with PCP on 1/13. Advised UC or ED for worsening symptoms.     Copied from CRM #[id number]. Topic: Clinical - Red Word Triage >> Aug 29, 2024  4:00 PM Wess RAMAN wrote: Red Word that prompted transfer to Nurse Triage: Hand issues- Blister or boil on thumb, causing pain, can't move fingers  Would like appt with PCP Reason for Disposition  Boil overlying a joint  Answer Assessment - Initial Assessment Questions 1. APPEARANCE of BOIL: What does the boil look like?      Size of her pinky nail, clear  2. LOCATION: Where is the boil located?      Right thumb  3. NUMBER: How many boils are there?      1  4. SIZE: How big is the boil? (e.g., inches, cm; compare to size of a coin or other object)     Size of her pinky nail, clear  5. ONSET: When did the boil start?     Recurrent for several months  6. PAIN: Is there any pain? If Yes, ask: How bad is the pain?   (Scale 1-10; or mild, moderate, severe)     5-6/10  7. FEVER: Do you have a fever? If Yes, ask: What is it, how was it measured, and when did it start?      Denies  8. SOURCE: Have you been around anyone with boils or other Staph infections? Have you ever had boils  before?     Hx of recurrent boil for several months  9. OTHER SYMPTOMS: Do you have any other symptoms? (e.g., shaking chills, weakness, rash elsewhere on body)     Feels like her hands lock up  Protocols used: Boil (Skin Abscess)-A-AH

## 2024-09-02 ENCOUNTER — Encounter: Payer: Self-pay | Admitting: Medical-Surgical

## 2024-09-02 ENCOUNTER — Ambulatory Visit: Admitting: Medical-Surgical

## 2024-09-02 ENCOUNTER — Ambulatory Visit

## 2024-09-02 VITALS — BP 133/53 | HR 64 | Temp 98.6°F | Resp 18 | Ht 66.0 in | Wt 145.0 lb

## 2024-09-02 DIAGNOSIS — I872 Venous insufficiency (chronic) (peripheral): Secondary | ICD-10-CM

## 2024-09-02 DIAGNOSIS — M79641 Pain in right hand: Secondary | ICD-10-CM

## 2024-09-02 DIAGNOSIS — M19041 Primary osteoarthritis, right hand: Secondary | ICD-10-CM | POA: Diagnosis not present

## 2024-09-02 DIAGNOSIS — R2231 Localized swelling, mass and lump, right upper limb: Secondary | ICD-10-CM

## 2024-09-02 DIAGNOSIS — M19042 Primary osteoarthritis, left hand: Secondary | ICD-10-CM | POA: Diagnosis not present

## 2024-09-02 NOTE — Progress Notes (Signed)
 "       Established patient visit   History of Present Illness   Discussed the use of AI scribe software for clinical note transcription with the patient, who gave verbal consent to proceed.  History of Present Illness   Kristy Olson is a 89 year old female with arthritis and venous stasis ulcers who presents with a recurring painful cyst on her thumb.  Digital cyst - Recurring painful, fluid-filled cyst on the thumb present for several months - Cyst fills with clear fluid and is tender to touch - Occasional spontaneous drainage decreases pain, but cyst refills and pain recurs - Location has shifted slightly from the center to the left side of the thumb between the nail and knuckle - No history of trauma or injury to the area  Arthritis-related hand dysfunction - Arthritis limits hand use, with hands 'freezing up' and difficulty holding utensils, especially over the past six months - Symptoms are worse in the right hand, though she is left-handed - Previously diagnosed with arthritis by a hand specialist  Chronic venous stasis and leg edema - Chronic venous stasis with significant bilateral leg swelling - Legs are currently swollen, itchy, and stiff; Left > right. - Ambulation and stair climbing are difficult due to leg symptoms - History of venous ulcers requiring more than three years of wound care - Compression stockings available but unable to apply independently  Pain management - Rarely uses pain medication - Prefers to manage pain without medication       Physical Exam   Physical Exam Vitals reviewed.  Constitutional:      General: She is not in acute distress.    Appearance: Normal appearance. She is not ill-appearing.  HENT:     Head: Normocephalic and atraumatic.  Cardiovascular:     Rate and Rhythm: Normal rate and regular rhythm.     Pulses: Normal pulses.     Heart sounds: Normal heart sounds. No murmur heard.    No friction rub. No gallop.   Pulmonary:     Effort: Pulmonary effort is normal. No respiratory distress.     Breath sounds: Normal breath sounds. No wheezing.  Musculoskeletal:     Right hand: Tenderness (Right thumb, see photos) present.     Right lower leg: Edema present.     Left lower leg: Edema (Multiple areas of peeling flaking skin and scabbing in various stages of healing) present.  Skin:    General: Skin is warm and dry.  Neurological:     Mental Status: She is alert and oriented to person, place, and time.  Psychiatric:        Mood and Affect: Mood normal.        Behavior: Behavior normal.        Thought Content: Thought content normal.        Judgment: Judgment normal.       Assessment & Plan   Right finger mass  Intermittent painful mass on right thumb with fluid drainage. Possible cyst.. Differential includes cyst or bone problem. - Ordered x-ray of right hand to evaluate mass. - Will review x-ray results to determine further management.  Chronic venous insufficiency with lower extremity edema and history of venous stasis ulcer Chronic venous insufficiency with significant lower extremity edema, history of venous stasis ulcer. Difficulty with compression stockings due to swelling and cost. - Applied Una boot to left lower extremity for one week. - Will schedule follow-up in one week to reassess and replace boot  if needed. - Discussed potential use of [employer] devices to assist with compression stocking application.  Osteoarthritis of both hands Chronic osteoarthritis in both hands, more pronounced in right hand. Symptoms include pain and stiffness, worsened over six months. Prefers to avoid medication due to interaction complexity. - Referred to occupational therapy for hand exercises and adaptive equipment. - Discussed potential use of ibuprofen for pain management.     Follow up   Return in about 1 week (around 09/09/2024) for Foot locker . __________________________________ Zada FREDRIK Palin,  DNP, APRN, FNP-BC Primary Care and Sports Medicine [facility] [place] "

## 2024-09-09 ENCOUNTER — Ambulatory Visit (INDEPENDENT_AMBULATORY_CARE_PROVIDER_SITE_OTHER)

## 2024-09-09 ENCOUNTER — Ambulatory Visit: Payer: Self-pay | Admitting: Medical-Surgical

## 2024-09-09 ENCOUNTER — Ambulatory Visit: Admitting: Medical-Surgical

## 2024-09-09 VITALS — BP 123/54 | HR 62 | Ht 66.0 in

## 2024-09-09 DIAGNOSIS — I872 Venous insufficiency (chronic) (peripheral): Secondary | ICD-10-CM | POA: Diagnosis not present

## 2024-09-09 NOTE — Progress Notes (Signed)
" ° °  Established Patient Office Visit  Subjective   Patient ID: Kristy Olson, female    DOB: September 22, 1931  Age: 89 y.o. MRN: [id number]  Chief Complaint  Patient presents with   Chronic venous stasis and left  leg edema    Unna boot - nurse visit    HPI  Chronic venous statis and left leg edema- nurse visit. Removed current unna boot from left leg-  no complications except patient states she had a lot of pain at top of  Left foot after unna boot placement at last visit. Area was slightly red at area of complaint. Patent mentioned that PT  had recommended that she continue with unna boot placements at patient's residence. She is unsure of who the PT was but she did think it was with [facility] and thought the PT name was Neville,  ROS    Objective:     BP (!) 123/54 (BP Location: Left Arm, Patient Position: Sitting, Cuff Size: Normal) Comment: patient would not remove sweatshirt for BP check.  Pulse 62   Ht 5' 6 (1.676 m)   SpO2 99%   BMI 23.41 kg/m    Physical Exam   No results found for any visits on 09/09/24.    The ASCVD Risk score (Arnett DK, et al., 2019) failed to calculate for the following reasons:   The 2019 ASCVD risk score is only valid for ages 6 to 54   * - Cholesterol units were assumed    Assessment & Plan:  Per [name], NP - unna boot placed again on left lower leg. Patient states that the PT had told her that PT could take over applying the unna boot at her place of residence. I attempted to contact well care to discuss this. Had to leave a return contact # on automated system. Spoke with patient who will have PT contact our office to discuss this. Problem List Items Addressed This Visit       Cardiovascular and Mediastinum   Chronic venous insufficiency - Primary   Relevant Orders   Remv/revisn boot/body cast    No follow-ups on file.    [name], LPN  "

## 2024-09-15 NOTE — Progress Notes (Signed)
 Attempted call to patient. Mail box is full-could not leave a voicemail message.

## 2024-09-17 NOTE — Progress Notes (Signed)
 Attempted call to patient. Left a voice mail message requesting a return call.

## 2024-09-17 NOTE — Telephone Encounter (Signed)
 Result Letter placed in mail to patient.
